# Patient Record
Sex: Female | Born: 1970 | Race: White | Hispanic: No | Marital: Married | Smoking: Former smoker
Health system: Southern US, Community
[De-identification: ages and names within clinical notes are randomized; demographics above are authoritative.]

## PROBLEM LIST (undated history)

## (undated) DIAGNOSIS — G4730 Sleep apnea, unspecified: Secondary | ICD-10-CM

## (undated) DIAGNOSIS — R569 Unspecified convulsions: Secondary | ICD-10-CM

## (undated) DIAGNOSIS — F32A Depression, unspecified: Secondary | ICD-10-CM

## (undated) DIAGNOSIS — F419 Anxiety disorder, unspecified: Secondary | ICD-10-CM

## (undated) DIAGNOSIS — E559 Vitamin D deficiency, unspecified: Secondary | ICD-10-CM

## (undated) DIAGNOSIS — E079 Disorder of thyroid, unspecified: Secondary | ICD-10-CM

## (undated) DIAGNOSIS — E739 Lactose intolerance, unspecified: Secondary | ICD-10-CM

## (undated) DIAGNOSIS — K219 Gastro-esophageal reflux disease without esophagitis: Secondary | ICD-10-CM

## (undated) DIAGNOSIS — F329 Major depressive disorder, single episode, unspecified: Secondary | ICD-10-CM

## (undated) DIAGNOSIS — I519 Heart disease, unspecified: Secondary | ICD-10-CM

## (undated) DIAGNOSIS — G43909 Migraine, unspecified, not intractable, without status migrainosus: Secondary | ICD-10-CM

## (undated) DIAGNOSIS — E039 Hypothyroidism, unspecified: Secondary | ICD-10-CM

## (undated) DIAGNOSIS — R630 Anorexia: Secondary | ICD-10-CM

## (undated) DIAGNOSIS — J45909 Unspecified asthma, uncomplicated: Secondary | ICD-10-CM

## (undated) DIAGNOSIS — F502 Bulimia nervosa, unspecified: Secondary | ICD-10-CM

## (undated) DIAGNOSIS — K589 Irritable bowel syndrome without diarrhea: Secondary | ICD-10-CM

## (undated) DIAGNOSIS — I10 Essential (primary) hypertension: Secondary | ICD-10-CM

## (undated) DIAGNOSIS — Z86718 Personal history of other venous thrombosis and embolism: Secondary | ICD-10-CM

## (undated) DIAGNOSIS — M1990 Unspecified osteoarthritis, unspecified site: Secondary | ICD-10-CM

## (undated) DIAGNOSIS — E0590 Thyrotoxicosis, unspecified without thyrotoxic crisis or storm: Secondary | ICD-10-CM

## (undated) DIAGNOSIS — G8929 Other chronic pain: Secondary | ICD-10-CM

## (undated) DIAGNOSIS — M2392 Unspecified internal derangement of left knee: Secondary | ICD-10-CM

## (undated) DIAGNOSIS — L309 Dermatitis, unspecified: Secondary | ICD-10-CM

## (undated) DIAGNOSIS — K5900 Constipation, unspecified: Secondary | ICD-10-CM

## (undated) HISTORY — PX: TOTAL ABDOMINAL HYSTERECTOMY: SHX209

## (undated) HISTORY — DX: Unspecified internal derangement of left knee: M23.92

## (undated) HISTORY — DX: Other chronic pain: G89.29

## (undated) HISTORY — DX: Unspecified osteoarthritis, unspecified site: M19.90

## (undated) HISTORY — DX: Essential (primary) hypertension: I10

## (undated) HISTORY — PX: OTHER SURGICAL HISTORY: SHX169

## (undated) HISTORY — PX: HEMANGIOMA EXCISION: SHX1734

## (undated) HISTORY — PX: OOPHORECTOMY: SHX86

## (undated) HISTORY — PX: MASS EXCISION: SHX2000

## (undated) HISTORY — DX: Vitamin D deficiency, unspecified: E55.9

## (undated) HISTORY — PX: TUBAL LIGATION: SHX77

## (undated) HISTORY — DX: Depression, unspecified: F32.A

## (undated) HISTORY — DX: Unspecified asthma, uncomplicated: J45.909

## (undated) HISTORY — DX: Lactose intolerance, unspecified: E73.9

## (undated) HISTORY — DX: Irritable bowel syndrome, unspecified: K58.9

## (undated) HISTORY — DX: Sleep apnea, unspecified: G47.30

## (undated) HISTORY — PX: COLONOSCOPY: SHX174

## (undated) HISTORY — DX: Bulimia nervosa, unspecified: F50.20

## (undated) HISTORY — DX: Gastro-esophageal reflux disease without esophagitis: K21.9

## (undated) HISTORY — DX: Heart disease, unspecified: I51.9

## (undated) HISTORY — DX: Migraine, unspecified, not intractable, without status migrainosus: G43.909

## (undated) HISTORY — DX: Unspecified convulsions: R56.9

## (undated) HISTORY — PX: TUMOR EXCISION: SHX421

## (undated) HISTORY — DX: Anxiety disorder, unspecified: F41.9

## (undated) HISTORY — DX: Dermatitis, unspecified: L30.9

## (undated) HISTORY — DX: Anorexia: R63.0

---
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
## 2011-12-28 DIAGNOSIS — E079 Disorder of thyroid, unspecified: Secondary | ICD-10-CM | POA: Insufficient documentation

## 2011-12-28 HISTORY — DX: Disorder of thyroid, unspecified: E07.9

## 2014-03-08 LAB — HM MAMMOGRAPHY

## 2019-08-19 ENCOUNTER — Encounter: Payer: Self-pay | Admitting: Family Medicine

## 2019-08-20 ENCOUNTER — Other Ambulatory Visit: Payer: Self-pay

## 2019-08-21 ENCOUNTER — Ambulatory Visit (INDEPENDENT_AMBULATORY_CARE_PROVIDER_SITE_OTHER): Payer: Self-pay | Admitting: Family Medicine

## 2019-08-21 ENCOUNTER — Encounter: Payer: Self-pay | Admitting: Family Medicine

## 2019-08-21 DIAGNOSIS — F3341 Major depressive disorder, recurrent, in partial remission: Secondary | ICD-10-CM | POA: Insufficient documentation

## 2019-08-21 DIAGNOSIS — J4540 Moderate persistent asthma, uncomplicated: Secondary | ICD-10-CM

## 2019-08-21 DIAGNOSIS — G4733 Obstructive sleep apnea (adult) (pediatric): Secondary | ICD-10-CM

## 2019-08-21 DIAGNOSIS — F419 Anxiety disorder, unspecified: Secondary | ICD-10-CM

## 2019-08-21 DIAGNOSIS — E079 Disorder of thyroid, unspecified: Secondary | ICD-10-CM

## 2019-08-21 DIAGNOSIS — K5900 Constipation, unspecified: Secondary | ICD-10-CM

## 2019-08-21 DIAGNOSIS — F411 Generalized anxiety disorder: Secondary | ICD-10-CM | POA: Insufficient documentation

## 2019-08-21 DIAGNOSIS — F32A Depression, unspecified: Secondary | ICD-10-CM

## 2019-08-21 DIAGNOSIS — F332 Major depressive disorder, recurrent severe without psychotic features: Secondary | ICD-10-CM | POA: Insufficient documentation

## 2019-08-21 DIAGNOSIS — G4730 Sleep apnea, unspecified: Secondary | ICD-10-CM | POA: Insufficient documentation

## 2019-08-21 DIAGNOSIS — J45909 Unspecified asthma, uncomplicated: Secondary | ICD-10-CM | POA: Insufficient documentation

## 2019-08-21 DIAGNOSIS — E559 Vitamin D deficiency, unspecified: Secondary | ICD-10-CM

## 2019-08-21 DIAGNOSIS — F329 Major depressive disorder, single episode, unspecified: Secondary | ICD-10-CM

## 2019-08-21 MED ORDER — DULOXETINE HCL 30 MG PO CPEP: 30.0000 mg | ORAL_CAPSULE | Freq: Every day | ORAL | 2 refills | Status: DC

## 2019-08-21 NOTE — Progress Notes (Signed)
New Patient Office Visit  Assessment & Plan:  1. Moderate persistent asthma without complication - Uncontrolled. Encouraged to use Albuterol as needed. Discussed a maintenance inhaler; patient is agreeable but will wait until she has health insurance. Financial assistance form given to patient today.   2. Obstructive sleep apnea syndrome - Well controlled on current regimen.   3. Disease of thyroid gland - Labs today to assess.  - Thyroid Panel With TSH - Thyroid antibodies - Parathyroid hormone, intact (no Ca) - VITAMIN D 25 Hydroxy (Vit-D Deficiency, Fractures) - Phosphorus - CBC with Differential/Platelet  4. Anxiety - Uncontrolled. Patient willing to try medication again and stay on it.  - DULoxetine (CYMBALTA) 30 MG capsule; Take 1 capsule (30 mg total) by mouth daily.  Dispense: 30 capsule; Refill: 2 - CMP14+EGFR - CBC with Differential/Platelet  5. Depression, unspecified depression type - Uncontrolled. Patient willing to try medication again and stay on it.  - DULoxetine (CYMBALTA) 30 MG capsule; Take 1 capsule (30 mg total) by mouth daily.  Dispense: 30 capsule; Refill: 2 - CMP14+EGFR - CBC with Differential/Platelet  6. Vitamin D deficiency - Well controlled on current regimen.  - VITAMIN D 25 Hydroxy (Vit-D Deficiency, Fractures) - Phosphorus - CMP14+EGFR  7. Constipation, unspecified constipation type - Well controlled on current regimen of Colace and Miralax.    Follow-up: Return in about 6 weeks (around 10/02/2019) for anxiety/depression.   [name], MSN, APRN, FNP-C [facility]  Subjective:  Patient ID: Anndrea Mihelich, female    DOB: [birth year]  Age: 48 y.o. MRN: [id number]  Patient Care Team: Loman Brooklyn, FNP as PCP - General (Family Medicine)  CC:  Chief Complaint  Patient presents with  . Establish Care    HPI Jannifer Fischler presents to establish care. She has recently moved from [place].   Asthma:  using Albuterol nebulizer "less than when she was in [place]". Patient states she has tried not to use it but knows that she really does need to use it several times throughout the day.   Sleep Apnea: patient wears CPAP nightly.   Thyroid disease: patient reports she has been to seven endocrinologist in [place]. She states each one tells her something different and her TSH is always all over the place and never consistent no matter what they do with her medications. She reports she was finally told to pick a dosage that she feels good on and they would keep it that way. She picked 100 mcg. She states she actually feels better on higher dosages but they cause chest pain. When she is decreased to lower dosages she gains weight. She reports last time they lowered her to 88 mcg she gained 70 lbs. States she is always doing insanity and beach body workouts but she cannot keep the weight off when they lower the levothyroxine. She begs today that regardless of what labs show, not to lower her dosage.   Anxiety/Depression: patient reports she has never stayed on anything consistent to help the anxiety and depression but she is ready to do so. According to previous records she has failed therapy with Paxil, Lexparo, Prozac, Wellbutrin, Xanax, and Ativan.   Depression screen [facility] 2/9 08/21/2019 08/21/2019  Decreased Interest 2 0  Down, Depressed, Hopeless 0 0  PHQ - 2 Score 2 0  Altered sleeping 2 -  Tired, decreased energy 3 -  Change in appetite 2 -  Feeling bad or failure about yourself  0 -  Trouble concentrating  0 -  Moving slowly or fidgety/restless 0 -  Suicidal thoughts 0 -  PHQ-9 Score 9 -  Difficult doing work/chores Somewhat difficult -   GAD 7 : Generalized Anxiety Score 08/21/2019  Nervous, Anxious, on Edge 3  Control/stop worrying 1  Worry too much - different things 0  Trouble relaxing 2  Restless 1  Easily annoyed or irritable 2  Afraid - awful might happen 0  Total GAD 7 Score 9   Anxiety Difficulty Not difficult at all    Review of Systems  Constitutional: Negative for chills, fever, malaise/fatigue and weight loss.  HENT: Negative for congestion, ear discharge, ear pain, nosebleeds, sinus pain, sore throat and tinnitus.   Eyes: Negative for blurred vision, double vision, pain, discharge and redness.  Respiratory: Positive for shortness of breath and wheezing. Negative for cough.   Cardiovascular: Negative for chest pain, palpitations and leg swelling.  Gastrointestinal: Positive for constipation. Negative for abdominal pain, diarrhea, heartburn, nausea and vomiting.  Genitourinary: Negative for dysuria, frequency and urgency.  Musculoskeletal: Positive for back pain and joint pain. Negative for myalgias.  Skin: Negative for rash.  Neurological: Negative for dizziness, seizures, weakness and headaches.  Psychiatric/Behavioral: Positive for depression. Negative for substance abuse and suicidal ideas. The patient is nervous/anxious.     Current Outpatient Medications:  .  albuterol (ACCUNEB) 0.63 MG/3ML nebulizer solution, Take 1 ampule by nebulization every 6 (six) hours as needed for wheezing., Disp: , Rfl:  .  aspirin EC 81 MG tablet, Take 81 mg by mouth daily., Disp: , Rfl:  .  b complex vitamins tablet, Take 1 tablet by mouth daily., Disp: , Rfl:  .  levothyroxine (SYNTHROID) 100 MCG tablet, Take 100 mcg by mouth daily before breakfast., Disp: , Rfl:  .  Multiple Vitamins-Minerals (MULTIVITAMIN ADULT PO), Take 1 tablet by mouth daily., Disp: , Rfl:  .  Riboflavin (VITAMIN B-2 PO), Take 1 tablet by mouth daily., Disp: , Rfl:  .  Vitamin D, Ergocalciferol, (DRISDOL) 1.25 MG (50000 UT) CAPS capsule, Take 50,000 Units by mouth every 7 (seven) days., Disp: , Rfl:  .  DULoxetine (CYMBALTA) 30 MG capsule, Take 1 capsule (30 mg total) by mouth daily., Disp: 30 capsule, Rfl: 2  Allergies  Allergen Reactions  . Iodine Anaphylaxis  . Latex Anaphylaxis    Past  Medical History:  Diagnosis Date  . Anxiety    Failed therapy with Paxil, Lexparo, Prozac, Wellbutrin, Xanax, and Ativan  . Arthritis   . Asthma   . Cardiac disease    Angina  . Chronic back pain   . Depression    Failed therapy with Paxil, Lexparo, Prozac, Wellbutrin, and Ativan  . Disease of thyroid gland 12/28/2011   multiple thyroid nodules in 2013 - [place] on 03/08/14 showed heterogeneous appearance of thyroid gland without focal nodule  . Eczema   . Generalized seizure ([place])    stress related  . GERD (gastroesophageal reflux disease)   . History of DVT (deep vein thrombosis) 1988  . Hypertension   . IBS (irritable bowel syndrome)   . Internal derangement of left knee   . Lactose intolerance   . Migraine   . Sleep apnea    wears CPAP  . Vitamin D deficiency     Past Surgical History:  Procedure Laterality Date  . ABDOMINAL HYSTERECTOMY    . CESAREAN SECTION     x3  . COLONOSCOPY    . HEMANGIOMA EXCISION Right    shoulder  .  MASS EXCISION Right    lump from palm of hand  . OOPHORECTOMY    . OTHER SURGICAL HISTORY Right    Repair of tib-fib fracture  . TUBAL LIGATION    . TUMOR EXCISION Right    Axilla - benign  . VASCULAR SURGERY Right 1989   blood clot removed from neck    Family History  Problem Relation Age of Onset  . Other Niece        antiphospholipid AB syndrome  . Factor V Leiden deficiency Sister   . CVA Sister        29s  . Thyroid disease Sister   . Breast cancer Sister   . Multiple sclerosis Sister   . Other Sister        guillain barre  . Depression Sister   . Migraines Sister   . Arthritis Mother   . Hypertension Mother   . Thyroid disease Mother   . Depression Mother   . Migraines Mother   . Arthritis Father   . Hypertension Father   . Depression Father   . Hypertension Maternal Grandmother   . Depression Maternal Grandmother   . Anxiety disorder Maternal Grandmother   . Hypertension Maternal Grandfather   . Hypertension Paternal  Grandmother   . Hypertension Paternal Grandfather   . CVA Sister   . Thyroid disease Sister   . Depression Sister   . Migraines Sister   . Asthma Daughter   . Depression Daughter   . Anxiety disorder Daughter   . Irritable bowel syndrome Daughter   . Lactose intolerance Daughter   . Migraines Daughter   . Asthma Son   . Depression Son   . Anxiety disorder Son   . Irritable bowel syndrome Son   . Lactose intolerance Son   . Migraines Son   . Asthma Son   . Depression Son   . Anxiety disorder Son   . Irritable bowel syndrome Son   . Lactose intolerance Son   . Migraines Son     Social History   Socioeconomic History  . Marital status: Married    Spouse name: Not on file  . Number of children: Not on file  . Years of education: Not on file  . Highest education level: Not on file  Occupational History  . Not on file  Social Needs  . Financial resource strain: Not on file  . Food insecurity    Worry: Not on file    Inability: Not on file  . Transportation needs    Medical: Not on file    Non-medical: Not on file  Tobacco Use  . Smoking status: Never Smoker  . Smokeless tobacco: Never Used  Substance and Sexual Activity  . Alcohol use: Never    Frequency: Never  . Drug use: Never  . Sexual activity: Not on file  Lifestyle  . Physical activity    Days per week: Not on file    Minutes per session: Not on file  . Stress: Not on file  Relationships  . Social Herbalist on phone: Not on file    Gets together: Not on file    Attends religious service: Not on file    Active member of club or organization: Not on file    Attends meetings of clubs or organizations: Not on file    Relationship status: Not on file  . Intimate partner violence    Fear of current or ex partner: Not on  file    Emotionally abused: Not on file    Physically abused: Not on file    Forced sexual activity: Not on file  Other Topics Concern  . Not on file  Social History  Narrative  . Not on file    Objective:   Today's Vitals: BP 119/77   Pulse 65   Temp 97.7 F (36.5 C) (Oral)   Resp 20   Ht '5\' 6"'$  (1.676 m)   Wt 186 lb (84.4 kg)   SpO2 98%   BMI 30.02 kg/m   Physical Exam Vitals signs reviewed.  Constitutional:      General: She is not in acute distress.    Appearance: Normal appearance. She is obese. She is not ill-appearing, toxic-appearing or diaphoretic.  HENT:     Head: Normocephalic and atraumatic.  Eyes:     General: No scleral icterus.       Right eye: No discharge.        Left eye: No discharge.     Conjunctiva/sclera: Conjunctivae normal.  Neck:     Musculoskeletal: Normal range of motion.  Cardiovascular:     Rate and Rhythm: Normal rate and regular rhythm.     Heart sounds: Normal heart sounds. No murmur. No friction rub. No gallop.   Pulmonary:     Effort: Pulmonary effort is normal. No respiratory distress.     Breath sounds: Normal breath sounds. No stridor. No wheezing, rhonchi or rales.  Musculoskeletal: Normal range of motion.  Skin:    General: Skin is warm and dry.     Capillary Refill: Capillary refill takes less than 2 seconds.  Neurological:     General: No focal deficit present.     Mental Status: She is alert and oriented to person, place, and time. Mental status is at baseline.  Psychiatric:        Mood and Affect: Mood normal.        Behavior: Behavior normal.        Thought Content: Thought content normal.        Judgment: Judgment normal.

## 2019-08-22 LAB — CBC WITH DIFFERENTIAL/PLATELET
Basophils Absolute: 0 10*3/uL (ref 0.0–0.2)
Basos: 0 %
EOS (ABSOLUTE): 0.1 10*3/uL (ref 0.0–0.4)
Eos: 2 %
Hematocrit: 44.2 % (ref 34.0–46.6)
Hemoglobin: 14.9 g/dL (ref 11.1–15.9)
Immature Grans (Abs): 0 10*3/uL (ref 0.0–0.1)
Immature Granulocytes: 0 %
Lymphocytes Absolute: 1.1 10*3/uL (ref 0.7–3.1)
Lymphs: 26 %
MCH: 30.1 pg (ref 26.6–33.0)
MCHC: 33.7 g/dL (ref 31.5–35.7)
MCV: 89 fL (ref 79–97)
Monocytes Absolute: 0.4 10*3/uL (ref 0.1–0.9)
Monocytes: 9 %
Neutrophils Absolute: 2.8 10*3/uL (ref 1.4–7.0)
Neutrophils: 63 %
Platelets: 164 10*3/uL (ref 150–450)
RBC: 4.95 x10E6/uL (ref 3.77–5.28)
RDW: 13.1 % (ref 11.7–15.4)
WBC: 4.4 10*3/uL (ref 3.4–10.8)

## 2019-08-22 LAB — CMP14+EGFR
ALT: 37 IU/L — ABNORMAL HIGH (ref 0–32)
AST: 33 IU/L (ref 0–40)
Albumin/Globulin Ratio: 2.2 (ref 1.2–2.2)
Albumin: 4.7 g/dL (ref 3.8–4.8)
Alkaline Phosphatase: 67 IU/L (ref 39–117)
BUN/Creatinine Ratio: 20 (ref 9–23)
BUN: 12 mg/dL (ref 6–24)
Bilirubin Total: 0.8 mg/dL (ref 0.0–1.2)
CO2: 21 mmol/L (ref 20–29)
Calcium: 9.6 mg/dL (ref 8.7–10.2)
Chloride: 103 mmol/L (ref 96–106)
Creatinine, Ser: 0.6 mg/dL (ref 0.57–1.00)
GFR calc Af Amer: 123 mL/min/{1.73_m2} (ref >59)
GFR calc non Af Amer: 107 mL/min/{1.73_m2} (ref >59)
Globulin, Total: 2.1 g/dL (ref 1.5–4.5)
Glucose: 102 mg/dL — ABNORMAL HIGH (ref 65–99)
Potassium: 4.4 mmol/L (ref 3.5–5.2)
Sodium: 142 mmol/L (ref 134–144)
Total Protein: 6.8 g/dL (ref 6.0–8.5)

## 2019-08-22 LAB — THYROID PANEL WITH TSH
Free Thyroxine Index: 2.7 (ref 1.2–4.9)
T3 Uptake Ratio: 29 % (ref 24–39)
T4, Total: 9.4 ug/dL (ref 4.5–12.0)
TSH: 0.11 u[IU]/mL — ABNORMAL LOW (ref 0.450–4.500)

## 2019-08-22 LAB — THYROID ANTIBODIES
Thyroglobulin Antibody: 3.1 IU/mL — ABNORMAL HIGH (ref 0.0–0.9)
Thyroperoxidase Ab SerPl-aCnc: 11 IU/mL (ref 0–34)

## 2019-08-22 LAB — PHOSPHORUS: Phosphorus: 4.2 mg/dL (ref 3.0–4.3)

## 2019-08-22 LAB — VITAMIN D 25 HYDROXY (VIT D DEFICIENCY, FRACTURES): Vit D, 25-Hydroxy: 62.6 ng/mL (ref 30.0–100.0)

## 2019-08-22 LAB — PARATHYROID HORMONE, INTACT (NO CA): PTH: 23 pg/mL (ref 15–65)

## 2019-08-26 ENCOUNTER — Encounter: Payer: Self-pay | Admitting: Family Medicine

## 2019-08-26 DIAGNOSIS — K5900 Constipation, unspecified: Secondary | ICD-10-CM

## 2019-08-26 DIAGNOSIS — K589 Irritable bowel syndrome without diarrhea: Secondary | ICD-10-CM | POA: Insufficient documentation

## 2019-08-26 HISTORY — DX: Constipation, unspecified: K59.00

## 2019-08-27 ENCOUNTER — Other Ambulatory Visit: Payer: Self-pay | Admitting: Family Medicine

## 2019-08-27 DIAGNOSIS — E079 Disorder of thyroid, unspecified: Secondary | ICD-10-CM

## 2019-09-04 ENCOUNTER — Encounter: Payer: Self-pay | Admitting: Family Medicine

## 2019-09-14 ENCOUNTER — Encounter: Payer: Self-pay | Admitting: Family Medicine

## 2019-09-14 DIAGNOSIS — G4733 Obstructive sleep apnea (adult) (pediatric): Secondary | ICD-10-CM

## 2019-09-15 NOTE — Telephone Encounter (Signed)
I left a message for her on her home number - The mobile number listed was not working - Her Endocrinology ref is in Review with [facility] endocrinology.   As far as her supplies - I do not handle that - That would be [employer].

## 2019-09-30 ENCOUNTER — Encounter: Payer: Self-pay | Admitting: Family Medicine

## 2019-09-30 DIAGNOSIS — F419 Anxiety disorder, unspecified: Secondary | ICD-10-CM

## 2019-09-30 DIAGNOSIS — F329 Major depressive disorder, single episode, unspecified: Secondary | ICD-10-CM

## 2019-09-30 DIAGNOSIS — F32A Depression, unspecified: Secondary | ICD-10-CM

## 2019-09-30 MED ORDER — DULOXETINE HCL 30 MG PO CPEP: 30.0000 mg | ORAL_CAPSULE | Freq: Every day | ORAL | 1 refills | Status: DC

## 2019-09-30 MED ORDER — LEVOTHYROXINE SODIUM 100 MCG PO TABS: 100.0000 ug | ORAL_TABLET | Freq: Every day | ORAL | 1 refills | Status: DC

## 2019-10-05 ENCOUNTER — Other Ambulatory Visit: Payer: Self-pay

## 2019-10-05 MED ORDER — LEVOTHYROXINE SODIUM 100 MCG PO TABS: 100.0000 ug | ORAL_TABLET | Freq: Every day | ORAL | 1 refills | Status: DC

## 2019-10-06 ENCOUNTER — Ambulatory Visit: Payer: Self-pay | Admitting: Family Medicine

## 2019-10-22 ENCOUNTER — Encounter: Payer: Self-pay | Admitting: Family Medicine

## 2019-10-22 ENCOUNTER — Other Ambulatory Visit: Payer: Self-pay

## 2019-10-22 DIAGNOSIS — F329 Major depressive disorder, single episode, unspecified: Secondary | ICD-10-CM

## 2019-10-22 DIAGNOSIS — F32A Depression, unspecified: Secondary | ICD-10-CM

## 2019-10-22 DIAGNOSIS — F419 Anxiety disorder, unspecified: Secondary | ICD-10-CM

## 2019-10-22 MED ORDER — LEVOTHYROXINE SODIUM 100 MCG PO TABS: 100.0000 ug | ORAL_TABLET | Freq: Every day | ORAL | 1 refills | Status: DC

## 2019-10-22 MED ORDER — DULOXETINE HCL 30 MG PO CPEP: 30.0000 mg | ORAL_CAPSULE | Freq: Every day | ORAL | 1 refills | Status: DC

## 2019-11-05 ENCOUNTER — Encounter: Payer: Self-pay | Admitting: Family Medicine

## 2019-11-09 MED ORDER — SYNTHROID 100 MCG PO TABS: 100.0000 ug | ORAL_TABLET | Freq: Every day | ORAL | 1 refills | Status: DC

## 2019-11-29 ENCOUNTER — Encounter: Payer: Self-pay | Admitting: Family Medicine

## 2019-11-30 ENCOUNTER — Other Ambulatory Visit: Payer: Self-pay | Admitting: *Deleted

## 2019-11-30 DIAGNOSIS — F419 Anxiety disorder, unspecified: Secondary | ICD-10-CM

## 2019-11-30 DIAGNOSIS — F329 Major depressive disorder, single episode, unspecified: Secondary | ICD-10-CM

## 2019-11-30 DIAGNOSIS — F32A Depression, unspecified: Secondary | ICD-10-CM

## 2019-11-30 MED ORDER — DULOXETINE HCL 30 MG PO CPEP: 30.0000 mg | ORAL_CAPSULE | Freq: Every day | ORAL | 0 refills | Status: DC

## 2019-12-01 ENCOUNTER — Encounter: Payer: Self-pay | Admitting: Family Medicine

## 2019-12-01 DIAGNOSIS — R3989 Other symptoms and signs involving the genitourinary system: Secondary | ICD-10-CM

## 2019-12-01 MED ORDER — CIPROFLOXACIN HCL 500 MG PO TABS: 500.0000 mg | ORAL_TABLET | Freq: Two times a day (BID) | ORAL | 0 refills | Status: AC

## 2019-12-03 ENCOUNTER — Encounter: Payer: Self-pay | Admitting: Family Medicine

## 2019-12-30 ENCOUNTER — Encounter: Payer: Self-pay | Admitting: Family Medicine

## 2020-01-11 ENCOUNTER — Encounter: Payer: Self-pay | Admitting: Family Medicine

## 2020-03-23 ENCOUNTER — Encounter: Payer: Self-pay | Admitting: Family Medicine

## 2020-03-23 DIAGNOSIS — E079 Disorder of thyroid, unspecified: Secondary | ICD-10-CM

## 2020-04-08 ENCOUNTER — Encounter: Payer: Self-pay | Admitting: Family Medicine

## 2020-04-18 ENCOUNTER — Encounter: Payer: Self-pay | Admitting: Family Medicine

## 2020-04-19 ENCOUNTER — Encounter: Payer: Self-pay | Admitting: Family Medicine

## 2020-04-19 DIAGNOSIS — F5101 Primary insomnia: Secondary | ICD-10-CM

## 2020-04-20 MED ORDER — TEMAZEPAM 15 MG PO CAPS: 15.0000 mg | ORAL_CAPSULE | Freq: Every evening | ORAL | 2 refills | Status: DC | PRN

## 2020-04-24 ENCOUNTER — Encounter: Payer: Self-pay | Admitting: Family Medicine

## 2020-04-24 DIAGNOSIS — K5900 Constipation, unspecified: Secondary | ICD-10-CM

## 2020-04-26 MED ORDER — LACTULOSE 10 G PO PACK: 10.0000 g | PACK | Freq: Every day | ORAL | 2 refills | Status: DC

## 2020-04-27 ENCOUNTER — Encounter: Payer: Self-pay | Admitting: Family Medicine

## 2020-04-27 DIAGNOSIS — K5900 Constipation, unspecified: Secondary | ICD-10-CM

## 2020-04-28 MED ORDER — LACTULOSE 10 G PO PACK: 10.0000 g | PACK | Freq: Every day | ORAL | 2 refills | Status: DC

## 2020-04-29 ENCOUNTER — Encounter: Payer: Self-pay | Admitting: Family Medicine

## 2020-04-29 DIAGNOSIS — K5900 Constipation, unspecified: Secondary | ICD-10-CM

## 2020-05-02 MED ORDER — LACTULOSE 10 GM/15ML PO SOLN: 10.0000 g | Freq: Every day | ORAL | 1 refills | Status: DC | PRN

## 2020-06-20 ENCOUNTER — Encounter: Payer: Self-pay | Admitting: Family Medicine

## 2020-06-22 ENCOUNTER — Other Ambulatory Visit: Payer: Self-pay | Admitting: Family Medicine

## 2020-06-22 DIAGNOSIS — Z1231 Encounter for screening mammogram for malignant neoplasm of breast: Secondary | ICD-10-CM

## 2020-06-29 ENCOUNTER — Other Ambulatory Visit: Payer: Self-pay | Admitting: Family Medicine

## 2020-06-29 DIAGNOSIS — F32A Depression, unspecified: Secondary | ICD-10-CM

## 2020-06-29 DIAGNOSIS — F419 Anxiety disorder, unspecified: Secondary | ICD-10-CM

## 2020-06-30 NOTE — Telephone Encounter (Signed)
Left message for patient to call back to schedule an appointment for medication refills. 

## 2020-06-30 NOTE — Telephone Encounter (Signed)
Last OV 08/21/19. Last RF 30 day supply given 11/2019. Next OV not scheduled  Refill denied-needs appt for refills

## 2020-07-06 ENCOUNTER — Other Ambulatory Visit: Payer: Self-pay | Admitting: Family Medicine

## 2020-07-06 DIAGNOSIS — F419 Anxiety disorder, unspecified: Secondary | ICD-10-CM

## 2020-07-06 DIAGNOSIS — F32A Depression, unspecified: Secondary | ICD-10-CM

## 2020-07-07 MED ORDER — DULOXETINE HCL 30 MG PO CPEP: 30.0000 mg | ORAL_CAPSULE | Freq: Every day | ORAL | 0 refills | Status: DC

## 2020-07-08 ENCOUNTER — Encounter: Payer: Self-pay | Admitting: Family Medicine

## 2020-08-03 ENCOUNTER — Encounter: Payer: Self-pay | Admitting: Family Medicine

## 2020-08-03 DIAGNOSIS — F5101 Primary insomnia: Secondary | ICD-10-CM

## 2020-08-04 MED ORDER — TEMAZEPAM 15 MG PO CAPS: 15.0000 mg | ORAL_CAPSULE | Freq: Every evening | ORAL | 2 refills | Status: DC | PRN

## 2020-08-05 ENCOUNTER — Encounter: Payer: Self-pay | Admitting: Family Medicine

## 2020-08-05 ENCOUNTER — Ambulatory Visit: Payer: 59 | Admitting: Family Medicine

## 2020-08-05 ENCOUNTER — Other Ambulatory Visit: Payer: Self-pay

## 2020-08-05 DIAGNOSIS — F332 Major depressive disorder, recurrent severe without psychotic features: Secondary | ICD-10-CM

## 2020-08-05 DIAGNOSIS — F419 Anxiety disorder, unspecified: Secondary | ICD-10-CM | POA: Diagnosis not present

## 2020-08-05 MED ORDER — DULOXETINE HCL 30 MG PO CPEP: 30.0000 mg | ORAL_CAPSULE | Freq: Two times a day (BID) | ORAL | 2 refills | Status: DC

## 2020-08-05 NOTE — Progress Notes (Signed)
Assessment & Plan:  1-2. Severe episode of recurrent major depressive disorder, without psychotic features (HCC)/Anxiety - Improving. Cymbalta increased from 30 mg QD to BID.  - DULoxetine (CYMBALTA) 30 MG capsule; Take 1 capsule (30 mg total) by mouth 2 (two) times daily.  Dispense: 60 capsule; Refill: 2   Return in about 6 weeks (around 09/16/2020) for depression, anxiety.  Deliah Boston, MSN, APRN, FNP-C [facility]  Subjective:    [name], female    DOB: [birth year], 49 y.o.   MRN: [id number]  Patient Care Team: Gwenlyn Fudge, FNP as PCP - General (Family Medicine)   Chief Complaint:  Chief Complaint  Patient presents with  . Depression    follow up- Patient states she would like her medication increased due to her depression being worse.    HPI: Olivia Zamora is a 49 y.o. female presenting on 08/05/2020 for Depression (follow up- Patient states she would like her medication increased due to her depression being worse.)  Patient is here for a follow-up of depression. She reports the medication worked well initially, but now feels she needs an increase as she is starting to feel flat. She has started experiencing tearfulness, sadness, and decline in her motivation.   Depression screen [facility] 2/9 08/05/2020 08/21/2019 08/21/2019  Decreased Interest 3 2 0  Down, Depressed, Hopeless 2 0 0  PHQ - 2 Score 5 2 0  Altered sleeping 3 2 -  Tired, decreased energy 3 3 -  Change in appetite 3 2 -  Feeling bad or failure about yourself  1 0 -  Trouble concentrating 2 0 -  Moving slowly or fidgety/restless 2 0 -  Suicidal thoughts 0 0 -  PHQ-9 Score 19 9 -  Difficult doing work/chores - Somewhat difficult -   GAD 7 : Generalized Anxiety Score 08/05/2020 08/21/2019  Nervous, Anxious, on Edge 3 3  Control/stop worrying 0 1  Worry too much - different things 2 0  Trouble relaxing 2 2  Restless 2 1  Easily annoyed or irritable 3 2  Afraid -  awful might happen 2 0  Total GAD 7 Score 14 9  Anxiety Difficulty - Not difficult at all   New complaints: None  Social history:  Relevant past medical, surgical, family and social history reviewed and updated as indicated. Interim medical history since our last visit reviewed.  Allergies and medications reviewed and updated.  DATA REVIEWED: CHART IN EPIC  ROS: Negative unless specifically indicated above in HPI.    Current Outpatient Medications:  .  albuterol (ACCUNEB) 0.63 MG/3ML nebulizer solution, Take 1 ampule by nebulization every 6 (six) hours as needed for wheezing., Disp: , Rfl:  .  aspirin EC 81 MG tablet, Take 81 mg by mouth daily., Disp: , Rfl:  .  b complex vitamins tablet, Take 1 tablet by mouth daily., Disp: , Rfl:  .  docusate sodium (COLACE) 100 MG capsule, Take 100 mg by mouth., Disp: , Rfl:  .  DULoxetine (CYMBALTA) 30 MG capsule, Take 1 capsule (30 mg total) by mouth daily. (Needs to be seen before next refill), Disp: 30 capsule, Rfl: 0 .  lactulose (CHRONULAC) 10 GM/15ML solution, Take 15 mLs (10 g total) by mouth daily as needed for mild constipation., Disp: 473 mL, Rfl: 1 .  Multiple Vitamins-Minerals (MULTIVITAMIN ADULT PO), Take 1 tablet by mouth daily., Disp: , Rfl:  .  polyethylene glycol (MIRALAX / GLYCOLAX) 17 g packet, Take 17 g by  mouth., Disp: , Rfl:  .  Riboflavin (VITAMIN B-2 PO), Take 1 tablet by mouth daily., Disp: , Rfl:  .  SYNTHROID 112 MCG tablet, Take 112 mcg by mouth daily., Disp: , Rfl:  .  temazepam (RESTORIL) 15 MG capsule, Take 1 capsule (15 mg total) by mouth at bedtime as needed for sleep., Disp: 30 capsule, Rfl: 2 .  Vitamin D, Ergocalciferol, (DRISDOL) 1.25 MG (50000 UT) CAPS capsule, Take 50,000 Units by mouth every 7 (seven) days., Disp: , Rfl:    Allergies  Allergen Reactions  . Iodine Anaphylaxis  . Latex Anaphylaxis   Past Medical History:  Diagnosis Date  . Anxiety    Failed therapy with Paxil, Lexparo, Prozac,  Wellbutrin, Xanax, and Ativan  . Arthritis   . Asthma   . Cardiac disease    Angina  . Chronic back pain   . Constipation 08/26/2019  . Depression    Failed therapy with Paxil, Lexparo, Prozac, Wellbutrin, and Ativan  . Disease of thyroid gland 12/28/2011   multiple thyroid nodules in 2013 - [place] on 03/08/14 showed heterogeneous appearance of thyroid gland without focal nodule  . Eczema   . Generalized seizure (HCC)    stress related  . GERD (gastroesophageal reflux disease)   . History of DVT (deep vein thrombosis) 1988  . Hypertension   . IBS (irritable bowel syndrome)   . Internal derangement of left knee   . Lactose intolerance   . Migraine   . Sleep apnea    wears CPAP  . Vitamin D deficiency     Past Surgical History:  Procedure Laterality Date  . ABDOMINAL HYSTERECTOMY    . CESAREAN SECTION     x3  . COLONOSCOPY    . HEMANGIOMA EXCISION Right    shoulder  . MASS EXCISION Right    lump from palm of hand  . OOPHORECTOMY    . OTHER SURGICAL HISTORY Right    Repair of tib-fib fracture  . TUBAL LIGATION    . TUMOR EXCISION Right    Axilla - benign  . VASCULAR SURGERY Right 1989   blood clot removed from neck    Social History   Socioeconomic History  . Marital status: Married    Spouse name: Not on file  . Number of children: Not on file  . Years of education: Not on file  . Highest education level: Not on file  Occupational History  . Not on file  Tobacco Use  . Smoking status: Never Smoker  . Smokeless tobacco: Never Used  Substance and Sexual Activity  . Alcohol use: Never  . Drug use: Never  . Sexual activity: Not on file  Other Topics Concern  . Not on file  Social History Narrative  . Not on file   Social Determinants of Health   Financial Resource Strain:   . Difficulty of Paying Living Expenses: Not on file  Food Insecurity:   . Worried About Programme researcher, broadcasting/film/video in the Last Year: Not on file  . Ran Out of Food in the Last Year: Not on file   Transportation Needs:   . Lack of Transportation (Medical): Not on file  . Lack of Transportation (Non-Medical): Not on file  Physical Activity:   . Days of Exercise per Week: Not on file  . Minutes of Exercise per Session: Not on file  Stress:   . Feeling of Stress : Not on file  Social Connections:   . Frequency of Communication with  Friends and Family: Not on file  . Frequency of Social Gatherings with Friends and Family: Not on file  . Attends Religious Services: Not on file  . Active Member of Clubs or Organizations: Not on file  . Attends Banker Meetings: Not on file  . Marital Status: Not on file  Intimate Partner Violence:   . Fear of Current or Ex-Partner: Not on file  . Emotionally Abused: Not on file  . Physically Abused: Not on file  . Sexually Abused: Not on file        Objective:    BP 134/86   Pulse 79   Temp (!) 96.8 F (36 C) (Temporal)   Ht 5\' 6"  (1.676 m)   Wt 193 lb 12.8 oz (87.9 kg)   SpO2 98%   BMI 31.28 kg/m   Wt Readings from Last 3 Encounters:  08/05/20 193 lb 12.8 oz (87.9 kg)  08/21/19 186 lb (84.4 kg)    Physical Exam Vitals reviewed.  Constitutional:      General: She is not in acute distress.    Appearance: Normal appearance. She is obese. She is not ill-appearing, toxic-appearing or diaphoretic.  HENT:     Head: Normocephalic and atraumatic.  Eyes:     General: No scleral icterus.       Right eye: No discharge.        Left eye: No discharge.     Conjunctiva/sclera: Conjunctivae normal.  Cardiovascular:     Rate and Rhythm: Normal rate and regular rhythm.     Heart sounds: Normal heart sounds. No murmur heard.  No friction rub. No gallop.   Pulmonary:     Effort: Pulmonary effort is normal. No respiratory distress.     Breath sounds: Normal breath sounds. No stridor. No wheezing, rhonchi or rales.  Musculoskeletal:        General: Normal range of motion.     Cervical back: Normal range of motion.  Skin:     General: Skin is warm and dry.     Capillary Refill: Capillary refill takes less than 2 seconds.  Neurological:     General: No focal deficit present.     Mental Status: She is alert and oriented to person, place, and time. Mental status is at baseline.  Psychiatric:        Mood and Affect: Mood normal.        Behavior: Behavior normal.        Thought Content: Thought content normal.        Judgment: Judgment normal.     Lab Results  Component Value Date   TSH 0.110 (L) 08/21/2019   Lab Results  Component Value Date   WBC 4.4 08/21/2019   HGB 14.9 08/21/2019   HCT 44.2 08/21/2019   MCV 89 08/21/2019   PLT 164 08/21/2019   Lab Results  Component Value Date   NA 142 08/21/2019   K 4.4 08/21/2019   CO2 21 08/21/2019   GLUCOSE 102 (H) 08/21/2019   BUN 12 08/21/2019   CREATININE 0.60 08/21/2019   BILITOT 0.8 08/21/2019   ALKPHOS 67 08/21/2019   AST 33 08/21/2019   ALT 37 (H) 08/21/2019   PROT 6.8 08/21/2019   ALBUMIN 4.7 08/21/2019   CALCIUM 9.6 08/21/2019   No results found for: CHOL No results found for: HDL No results found for: LDLCALC No results found for: TRIG No results found for: CHOLHDL No results found for: 10/21/2019

## 2020-08-07 ENCOUNTER — Encounter: Payer: Self-pay | Admitting: Family Medicine

## 2020-08-10 ENCOUNTER — Other Ambulatory Visit (HOSPITAL_COMMUNITY): Payer: Self-pay | Admitting: Urology

## 2020-08-10 ENCOUNTER — Other Ambulatory Visit: Payer: Self-pay | Admitting: Urology

## 2020-08-17 ENCOUNTER — Other Ambulatory Visit: Payer: Self-pay | Admitting: Urology

## 2020-08-17 DIAGNOSIS — R3982 Chronic bladder pain: Secondary | ICD-10-CM

## 2020-08-24 ENCOUNTER — Encounter (HOSPITAL_COMMUNITY): Payer: Self-pay

## 2020-08-24 ENCOUNTER — Ambulatory Visit (HOSPITAL_COMMUNITY): Payer: 59

## 2020-08-24 ENCOUNTER — Encounter: Payer: Self-pay | Admitting: Family Medicine

## 2020-08-24 DIAGNOSIS — F5101 Primary insomnia: Secondary | ICD-10-CM

## 2020-08-26 MED ORDER — TEMAZEPAM 30 MG PO CAPS: 30.0000 mg | ORAL_CAPSULE | Freq: Every day | ORAL | 2 refills | Status: DC

## 2020-09-05 ENCOUNTER — Encounter: Payer: Self-pay | Admitting: Family Medicine

## 2020-09-07 ENCOUNTER — Other Ambulatory Visit: Payer: Self-pay

## 2020-09-07 ENCOUNTER — Ambulatory Visit
Admission: RE | Admit: 2020-09-07 | Discharge: 2020-09-07 | Disposition: A | Discharge status: Home / Self Care | Payer: 59 | Source: Ambulatory Visit | Attending: Family Medicine | Admitting: Family Medicine

## 2020-09-07 DIAGNOSIS — Z1231 Encounter for screening mammogram for malignant neoplasm of breast: Secondary | ICD-10-CM

## 2020-09-15 ENCOUNTER — Ambulatory Visit: Payer: 59 | Admitting: Family Medicine

## 2020-09-18 ENCOUNTER — Encounter: Payer: Self-pay | Admitting: Family Medicine

## 2020-09-28 ENCOUNTER — Ambulatory Visit: Payer: 59 | Admitting: Family Medicine

## 2020-10-03 ENCOUNTER — Encounter: Payer: Self-pay | Admitting: Family Medicine

## 2020-10-07 ENCOUNTER — Ambulatory Visit: Payer: 59 | Admitting: Family Medicine

## 2020-10-31 ENCOUNTER — Encounter: Payer: Self-pay | Admitting: Family Medicine

## 2020-11-14 ENCOUNTER — Other Ambulatory Visit: Payer: Self-pay | Admitting: Family Medicine

## 2020-11-14 DIAGNOSIS — F332 Major depressive disorder, recurrent severe without psychotic features: Secondary | ICD-10-CM

## 2020-11-14 DIAGNOSIS — F419 Anxiety disorder, unspecified: Secondary | ICD-10-CM

## 2020-11-16 ENCOUNTER — Telehealth: Payer: Self-pay | Admitting: *Deleted

## 2020-11-16 NOTE — Telephone Encounter (Signed)
PA in Process for Duloxetine (Key: [id number])  Your information has been sent to [facility]

## 2020-11-17 NOTE — Telephone Encounter (Signed)
Approved on December 8 Request Reference Number: [id number]. DULOXETINE CAP 30MG  is approved through 11/16/2021. Your patient may now fill this prescription and it will be covered.  WM called and aware

## 2020-12-07 ENCOUNTER — Ambulatory Visit
Admission: RE | Admit: 2020-12-07 | Discharge: 2020-12-07 | Disposition: A | Discharge status: Home / Self Care | Payer: 59 | Source: Ambulatory Visit | Attending: Family Medicine | Admitting: Family Medicine

## 2020-12-07 IMAGING — MG DIGITAL SCREENING BILAT W/ TOMO W/ CAD
8 series · 8 of 24 positions shown · non-contrast
Comparison: None.

CLINICAL DATA: Screening.

EXAM:
DIGITAL SCREENING BILATERAL MAMMOGRAM WITH TOMO AND CAD

[L CC synth-2D]
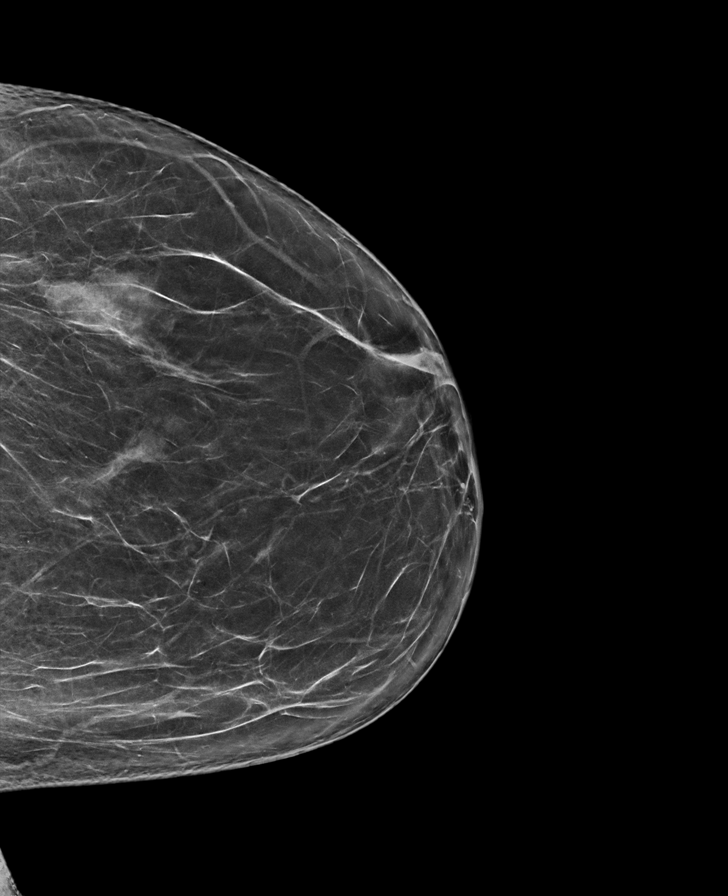

[L MLO synth-2D]
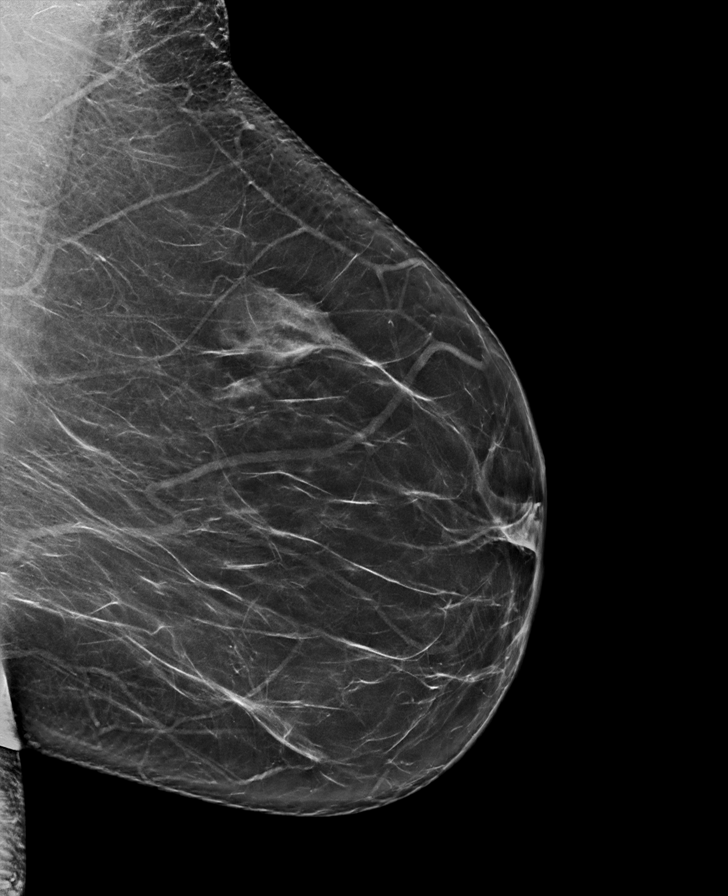

[R MLO synth-2D]
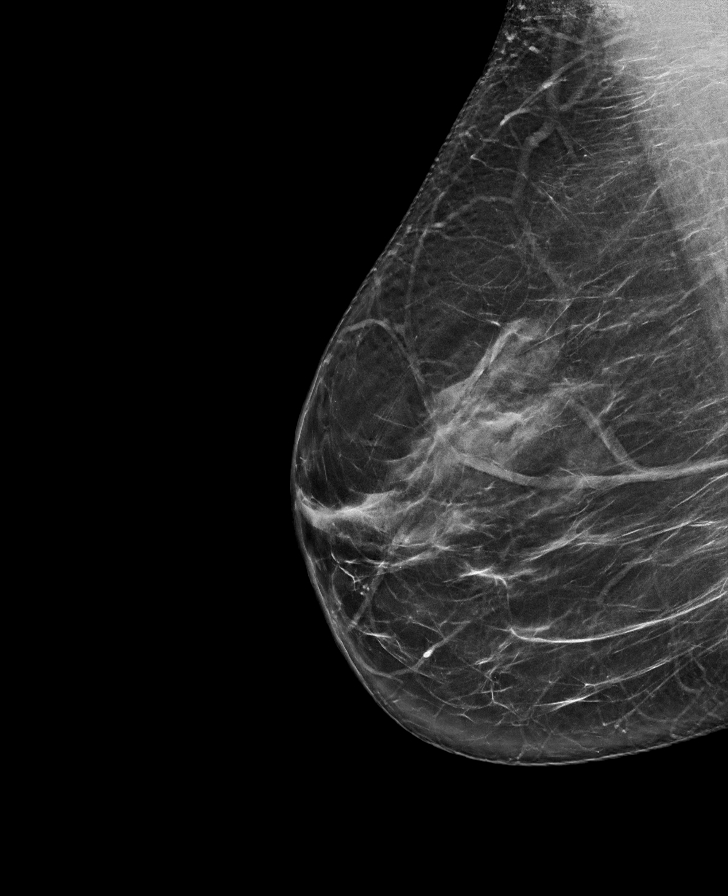

[R CC synth-2D]
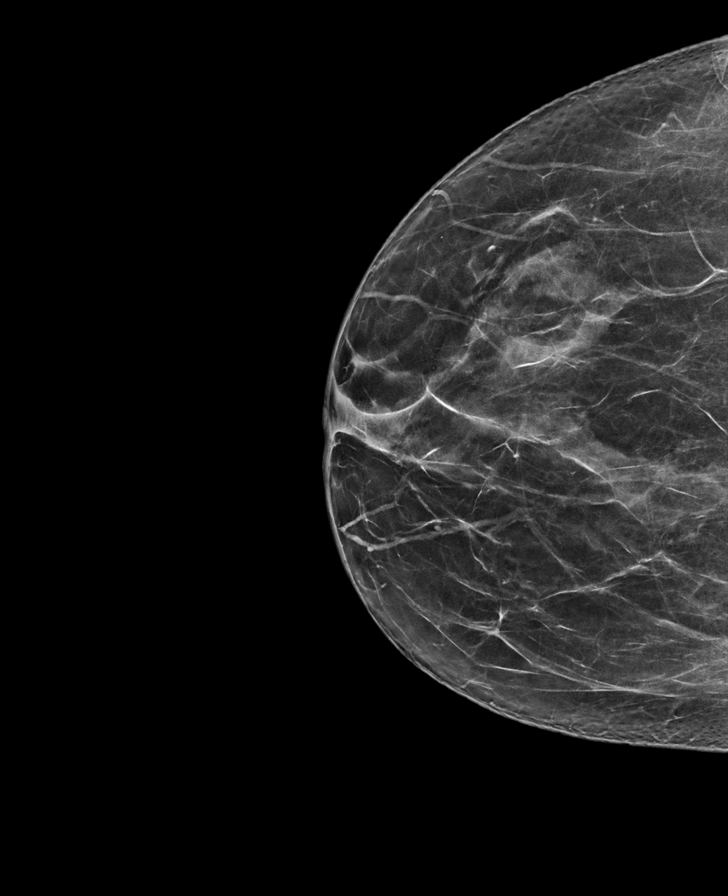

[R CC tomo · tomo slice 34/67.0]
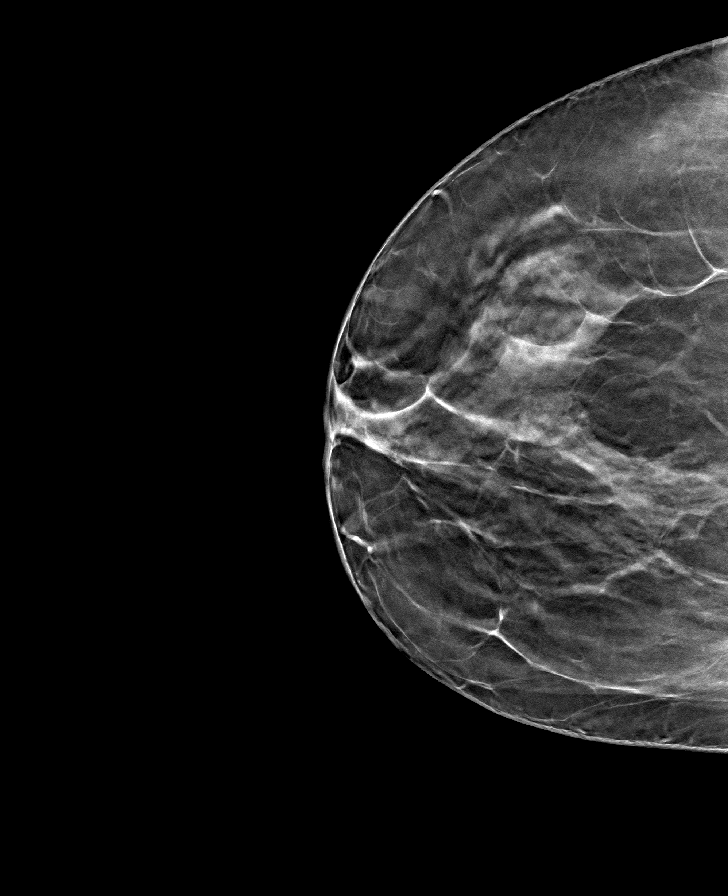

[L MLO tomo · tomo slice 41/81.0]
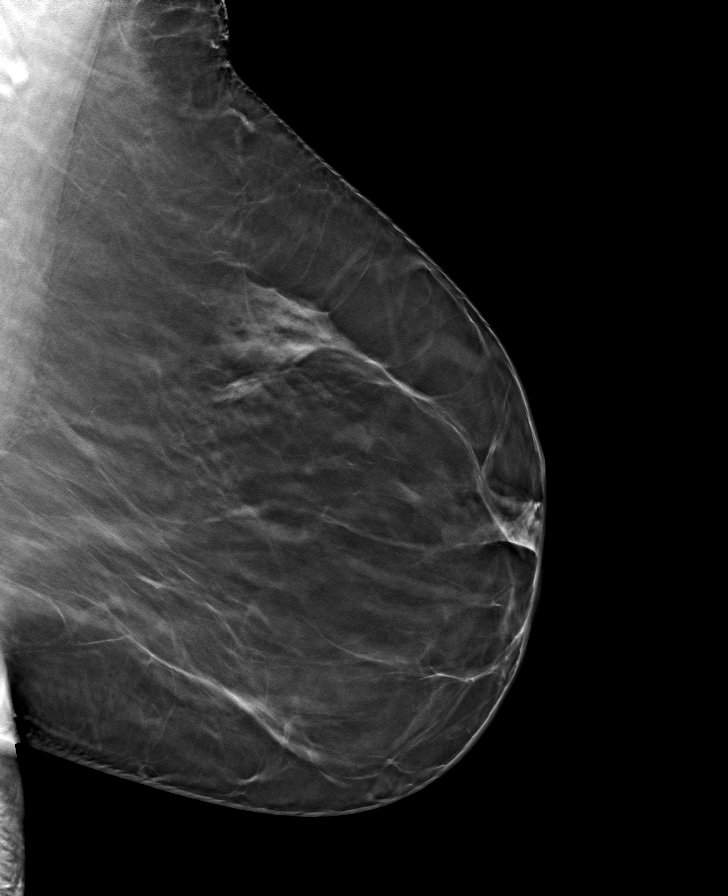

[R MLO tomo · tomo slice 39/78.0]
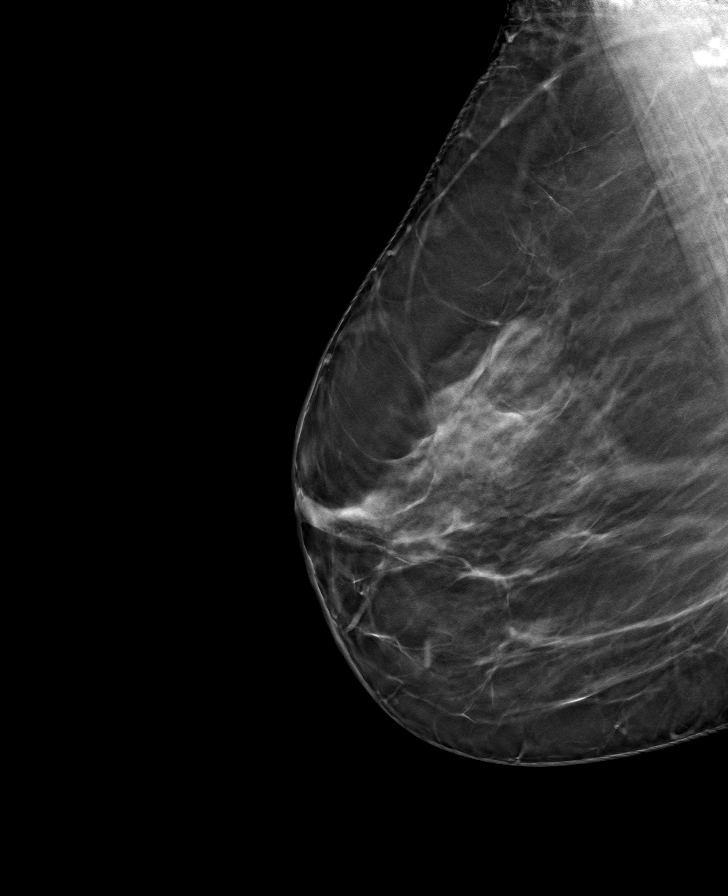

[L CC tomo · tomo slice 39/76.0]
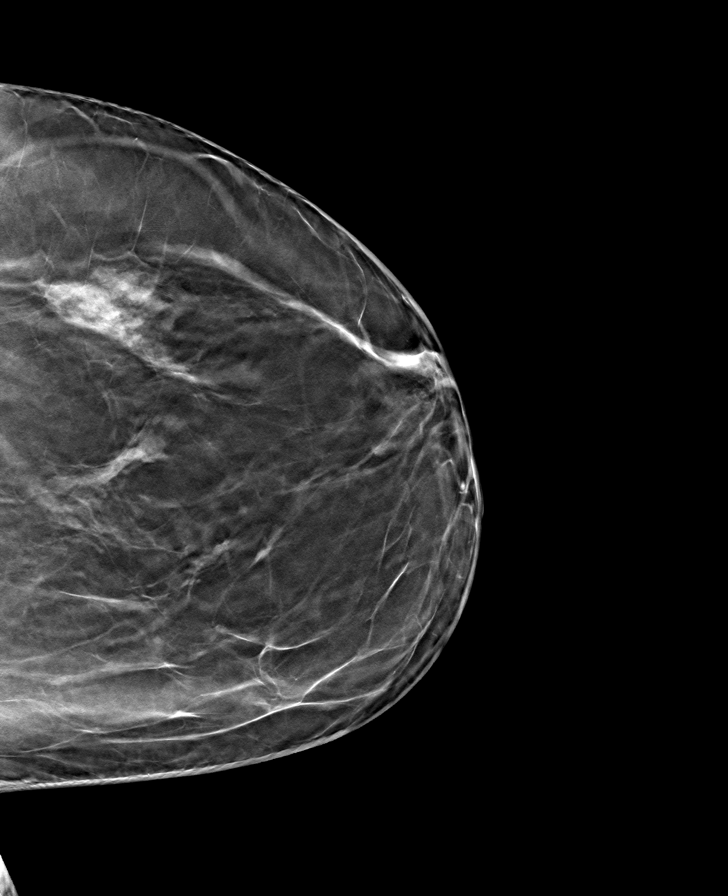

[8 of 24 positions shown; findings below may reference images not displayed]

ACR Breast Density Category b: There are scattered areas of
fibroglandular density.
FINDINGS: There are no findings suspicious for malignancy. Images were
processed with CAD.
IMPRESSION: No mammographic evidence of malignancy. A result letter of this
screening mammogram will be mailed directly to the patient.

RECOMMENDATION:
Screening mammogram in one year. (Code:[1T])

BI-RADS CATEGORY  1: Negative.

## 2020-12-15 ENCOUNTER — Other Ambulatory Visit: Payer: Self-pay | Admitting: Family Medicine

## 2020-12-15 DIAGNOSIS — F419 Anxiety disorder, unspecified: Secondary | ICD-10-CM

## 2020-12-15 DIAGNOSIS — F332 Major depressive disorder, recurrent severe without psychotic features: Secondary | ICD-10-CM

## 2020-12-26 ENCOUNTER — Encounter: Payer: Self-pay | Admitting: Family Medicine

## 2020-12-28 ENCOUNTER — Ambulatory Visit (INDEPENDENT_AMBULATORY_CARE_PROVIDER_SITE_OTHER): Payer: 59

## 2020-12-28 ENCOUNTER — Other Ambulatory Visit: Payer: Self-pay

## 2020-12-28 ENCOUNTER — Ambulatory Visit: Payer: 59 | Admitting: Family Medicine

## 2020-12-28 ENCOUNTER — Encounter: Payer: Self-pay | Admitting: Family Medicine

## 2020-12-28 VITALS — BP 105/77 | HR 84 | Temp 98.0°F | Ht 66.0 in | Wt 192.0 lb

## 2020-12-28 DIAGNOSIS — F5101 Primary insomnia: Secondary | ICD-10-CM

## 2020-12-28 DIAGNOSIS — R296 Repeated falls: Secondary | ICD-10-CM

## 2020-12-28 DIAGNOSIS — M25562 Pain in left knee: Secondary | ICD-10-CM | POA: Diagnosis not present

## 2020-12-28 DIAGNOSIS — R29898 Other symptoms and signs involving the musculoskeletal system: Secondary | ICD-10-CM

## 2020-12-28 DIAGNOSIS — G8929 Other chronic pain: Secondary | ICD-10-CM | POA: Diagnosis not present

## 2020-12-28 DIAGNOSIS — Z8673 Personal history of transient ischemic attack (TIA), and cerebral infarction without residual deficits: Secondary | ICD-10-CM

## 2020-12-28 IMAGING — DX DG KNEE 1-2V*L*
2 series · 2 of 2 positions shown · non-contrast
Comparison: None.

CLINICAL DATA: Left knee pain after multiple falls.

EXAM:
LEFT KNEE - 1-2 VIEW

[knee ap]
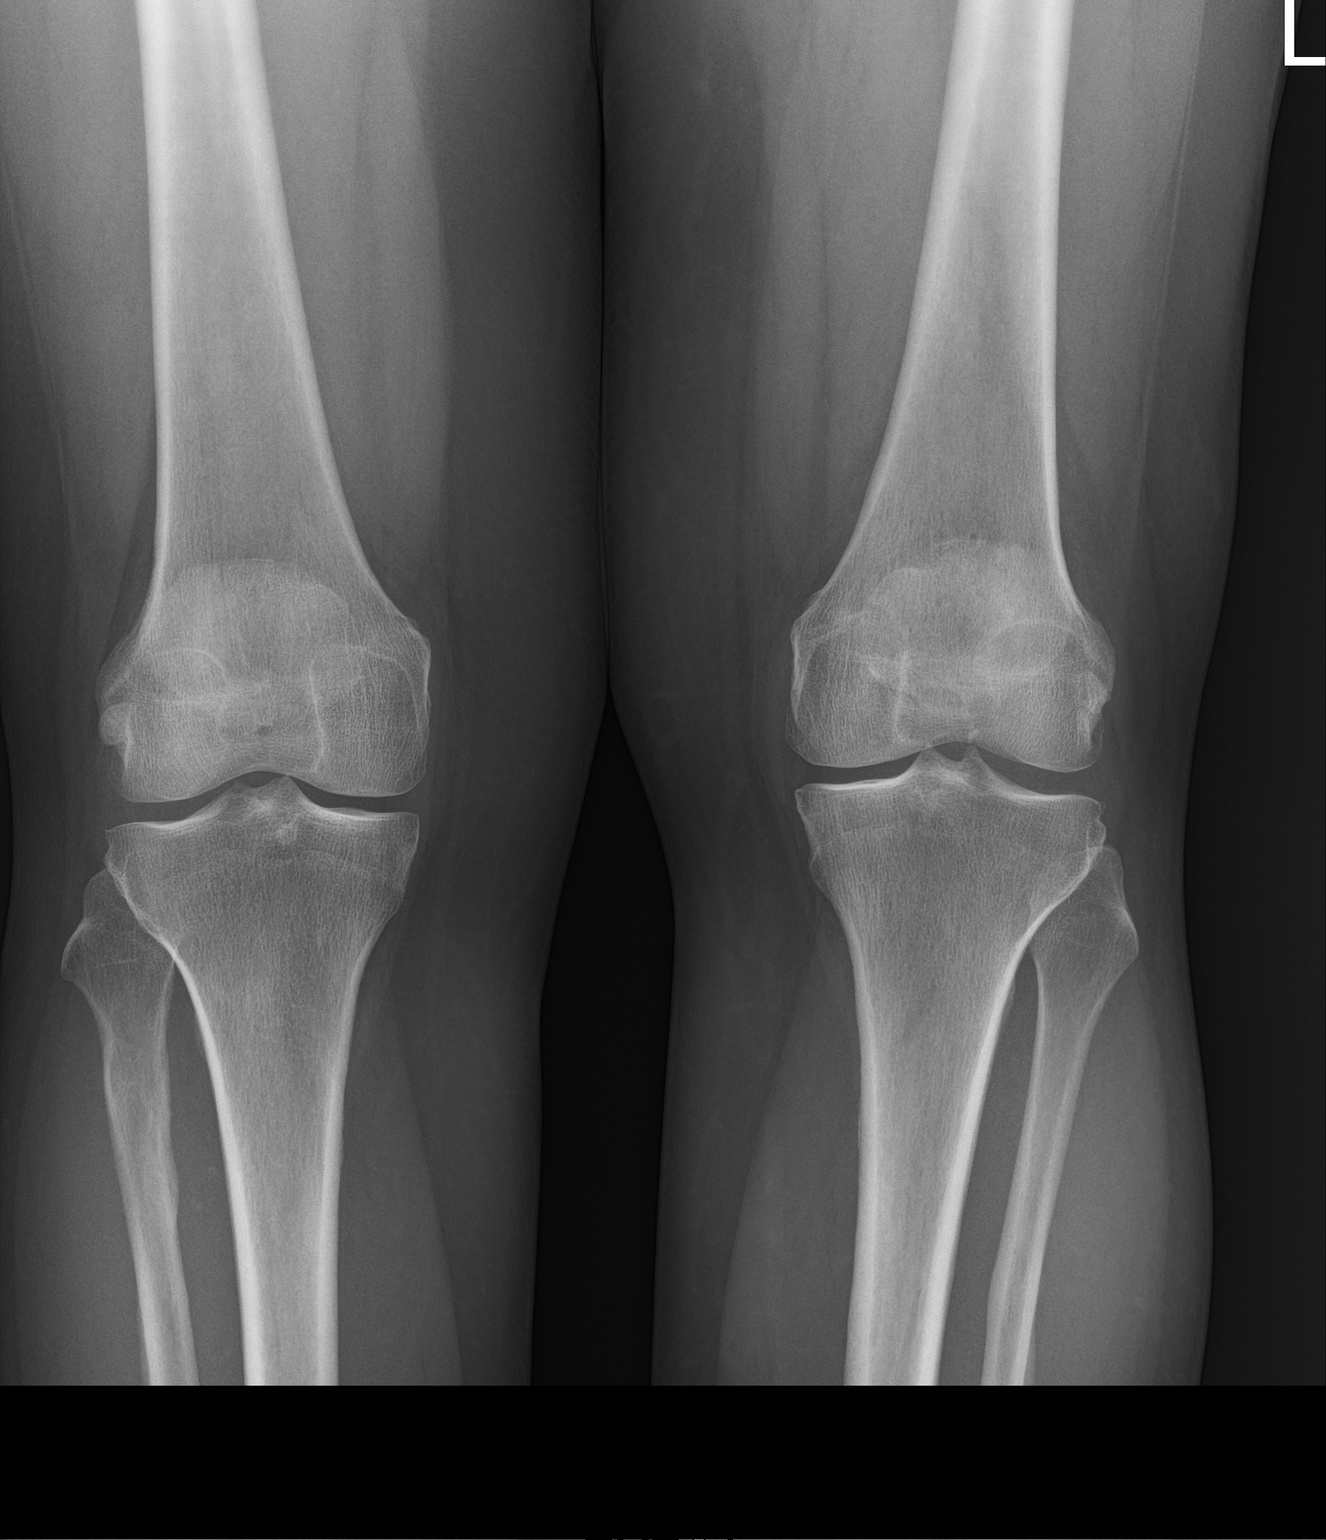

[knee lat]
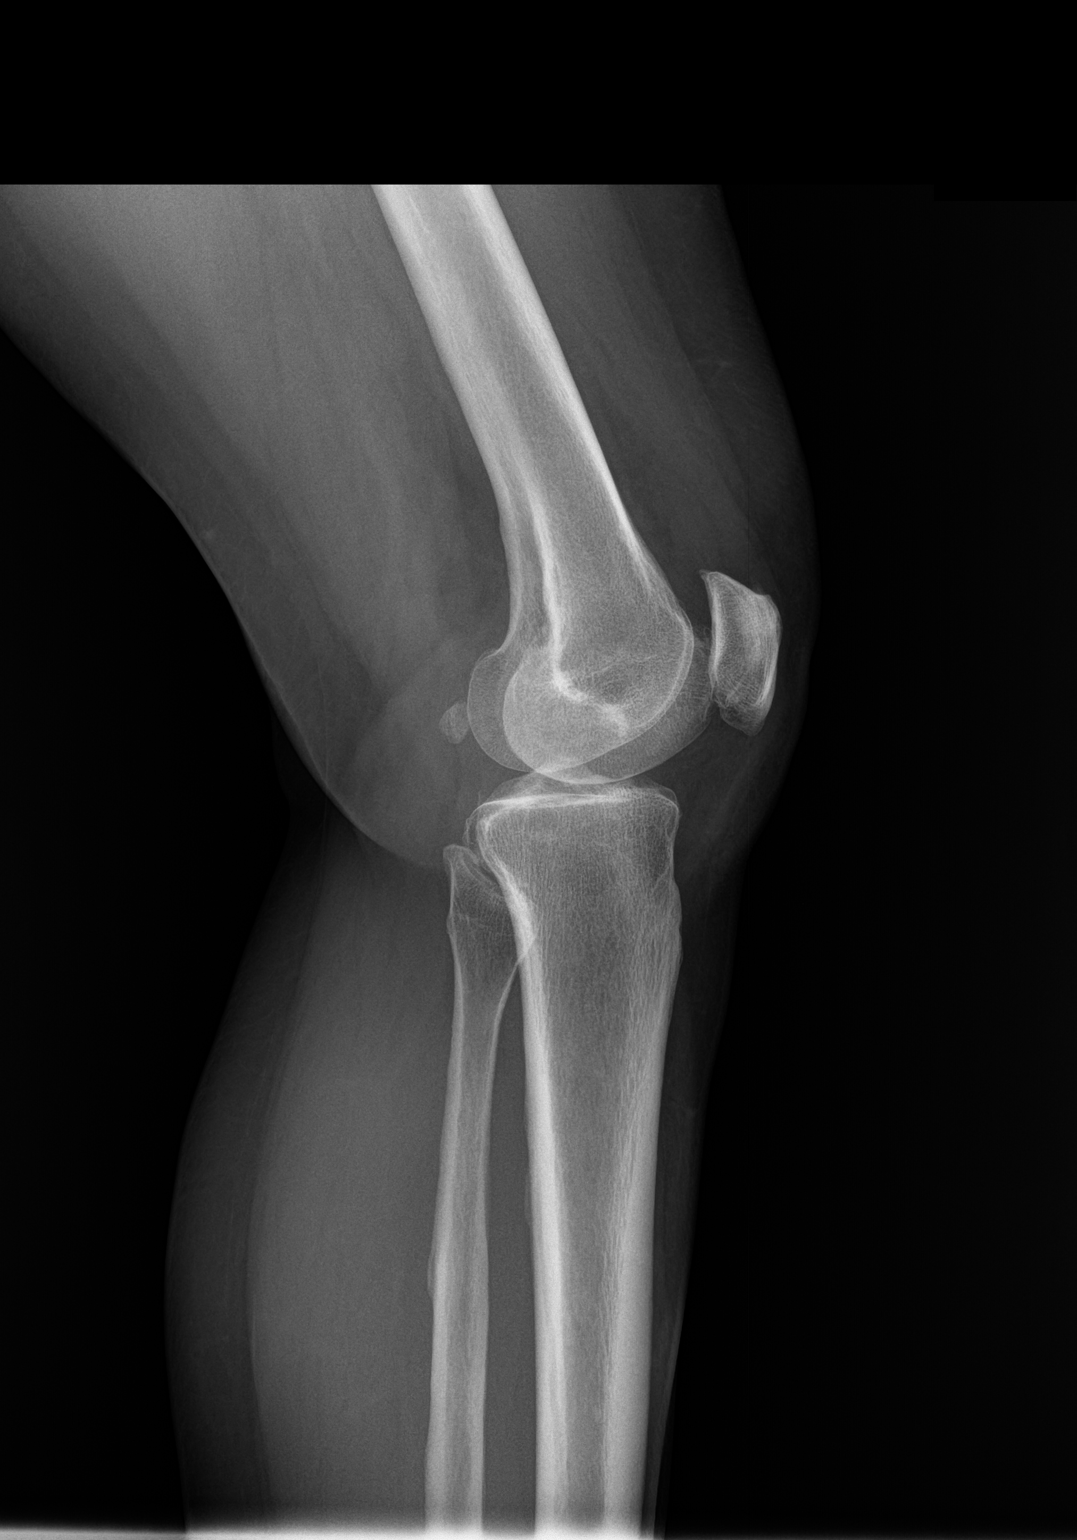

[2 of 2 positions shown; findings below may reference images not displayed]

FINDINGS: No evidence of fracture, dislocation, or joint effusion. No evidence
of arthropathy or other focal bone abnormality. Soft tissues are
unremarkable.
IMPRESSION: Negative.

## 2020-12-28 MED ORDER — TEMAZEPAM 30 MG PO CAPS: 30.0000 mg | ORAL_CAPSULE | Freq: Every day | ORAL | 5 refills | Status: DC

## 2020-12-28 NOTE — Progress Notes (Unsigned)
Assessment & Plan:  1. Primary insomnia - Well controlled on current regimen.  - temazepam (RESTORIL) 30 MG capsule; Take 1 capsule (30 mg total) by mouth at bedtime.  Dispense: 30 capsule; Refill: 5  2. Chronic pain of left knee - DG Knee 1-2 Views Left - Ambulatory referral to Orthopedic Surgery - Ambulatory referral to Physical Therapy  3. Frequent falls - Ambulatory referral to Orthopedic Surgery - Ambulatory referral to Physical Therapy  4. History of CVA (cerebrovascular accident) - Ambulatory referral to Orthopedic Surgery - Ambulatory referral to Physical Therapy  5. Weakness of left lower extremity - Ambulatory referral to Orthopedic Surgery - Ambulatory referral to Physical Therapy   Return in about 6 months (around 06/27/2021) for follow-up of chronic medication conditions.  Deliah Boston, MSN, APRN, FNP-C [facility]  Subjective:    [name], female    DOB: 1971/06/02, 50 y.o.   MRN: [id number]  Patient Care Team: Gwenlyn Fudge, FNP as PCP - General (Family Medicine)   Chief Complaint:  Chief Complaint  Patient presents with  . Depression    Check up of chronic medical conditions   . Knee Pain    Patient states she has been having ongoing left knee pain     HPI: Olivia Zamora is a 50 y.o. female presenting on 12/28/2020 for Depression (Check up of chronic medical conditions/) and Knee Pain (Patient states she has been having ongoing left knee pain/)  Patient is here for a refill of her Restoril which she is sleeping well with.  New complaints: Patient reports trouble with her left leg and knee.  She states her balance is off and she has had multiple falls.  She has fallen 4 times this week alone and today is only Wednesday.  She states she has had trouble with her leg and knee for a couple of years and was seeing an orthopedic previously, but her symptoms are getting worse.  She states she was told previously  that she had significant arthritis in the left knee and would likely need a knee replacement in the future.  She was also told she had atrophy of the muscles in her left leg due to a stroke.  She was unable to complete physical therapy at the time as she could not afford it.  They recommended a knee brace that she did not wear as she did not like the look of it.  Currently she reports her left thigh will start shaking and she will feel the weakness prior to falling.  She also feels like her left knee pops out and floats.  Other times she feels her knee is stiff and will not straighten.  She reports at times she has swelling all around her knee although there is none present today.  She has been applying Voltaren and IcyHot.  The pain and weakness on the left is causing her to walk differently and causing pain in her low back.   Social history:  Relevant past medical, surgical, family and social history reviewed and updated as indicated. Interim medical history since our last visit reviewed.  Allergies and medications reviewed and updated.  DATA REVIEWED: CHART IN EPIC  ROS: Negative unless specifically indicated above in HPI.    Current Outpatient Medications:  .  albuterol (ACCUNEB) 0.63 MG/3ML nebulizer solution, Take 1 ampule by nebulization every 6 (six) hours as needed for wheezing., Disp: , Rfl:  .  aspirin EC 81 MG tablet, Take 81  mg by mouth daily., Disp: , Rfl:  .  b complex vitamins tablet, Take 1 tablet by mouth daily., Disp: , Rfl:  .  docusate sodium (COLACE) 100 MG capsule, Take 100 mg by mouth., Disp: , Rfl:  .  DULoxetine (CYMBALTA) 30 MG capsule, Take 1 capsule by mouth twice daily, Disp: 60 capsule, Rfl: 0 .  lactulose (CHRONULAC) 10 GM/15ML solution, Take 15 mLs (10 g total) by mouth daily as needed for mild constipation., Disp: 473 mL, Rfl: 1 .  Multiple Vitamins-Minerals (MULTIVITAMIN ADULT PO), Take 1 tablet by mouth daily., Disp: , Rfl:  .  polyethylene glycol (MIRALAX /  GLYCOLAX) 17 g packet, Take 17 g by mouth., Disp: , Rfl:  .  Riboflavin (VITAMIN B-2 PO), Take 1 tablet by mouth daily., Disp: , Rfl:  .  SYNTHROID 100 MCG tablet, Take 1 tablet by mouth daily at 2 PM., Disp: , Rfl:  .  SYNTHROID 112 MCG tablet, Take 112 mcg by mouth daily., Disp: , Rfl:  .  temazepam (RESTORIL) 30 MG capsule, Take 1 capsule (30 mg total) by mouth at bedtime., Disp: 30 capsule, Rfl: 2 .  Vitamin D, Ergocalciferol, (DRISDOL) 1.25 MG (50000 UT) CAPS capsule, Take 50,000 Units by mouth every 7 (seven) days., Disp: , Rfl:    Allergies  Allergen Reactions  . Iodine Anaphylaxis  . Latex Anaphylaxis   Past Medical History:  Diagnosis Date  . Anxiety    Failed therapy with Paxil, Lexparo, Prozac, Wellbutrin, Xanax, and Ativan  . Arthritis   . Asthma   . Cardiac disease    Angina  . Chronic back pain   . Constipation 08/26/2019  . Depression    Failed therapy with Paxil, Lexparo, Prozac, Wellbutrin, and Ativan  . Disease of thyroid gland 12/28/2011   multiple thyroid nodules in 2013 - [place] on 03/08/14 showed heterogeneous appearance of thyroid gland without focal nodule  . Eczema   . Generalized seizure (HCC)    stress related  . GERD (gastroesophageal reflux disease)   . History of DVT (deep vein thrombosis) 1988  . Hypertension   . IBS (irritable bowel syndrome)   . Internal derangement of left knee   . Lactose intolerance   . Migraine   . Sleep apnea    wears CPAP  . Vitamin D deficiency     Past Surgical History:  Procedure Laterality Date  . ABDOMINAL HYSTERECTOMY    . CESAREAN SECTION     x3  . COLONOSCOPY    . HEMANGIOMA EXCISION Right    shoulder  . MASS EXCISION Right    lump from palm of hand  . OOPHORECTOMY    . OTHER SURGICAL HISTORY Right    Repair of tib-fib fracture  . TUBAL LIGATION    . TUMOR EXCISION Right    Axilla - benign  . VASCULAR SURGERY Right 1989   blood clot removed from neck    Social History   Socioeconomic History  .  Marital status: Married    Spouse name: Not on file  . Number of children: Not on file  . Years of education: Not on file  . Highest education level: Not on file  Occupational History  . Not on file  Tobacco Use  . Smoking status: Never Smoker  . Smokeless tobacco: Never Used  Substance and Sexual Activity  . Alcohol use: Never  . Drug use: Never  . Sexual activity: Not on file  Other Topics Concern  . Not on  file  Social History Narrative  . Not on file   Social Determinants of Health   Financial Resource Strain: Not on file  Food Insecurity: Not on file  Transportation Needs: Not on file  Physical Activity: Not on file  Stress: Not on file  Social Connections: Not on file  Intimate Partner Violence: Not on file        Objective:    BP 105/77   Pulse 84   Temp 98 F (36.7 C) (Temporal)   Ht 5\' 6"  (1.676 m)   Wt 192 lb (87.1 kg)   SpO2 92%   BMI 30.99 kg/m   Wt Readings from Last 3 Encounters:  12/28/20 192 lb (87.1 kg)  08/05/20 193 lb 12.8 oz (87.9 kg)  08/21/19 186 lb (84.4 kg)    Physical Exam Vitals reviewed.  Constitutional:      General: She is not in acute distress.    Appearance: Normal appearance. She is not ill-appearing, toxic-appearing or diaphoretic.  HENT:     Head: Normocephalic and atraumatic.  Eyes:     General: No scleral icterus.       Right eye: No discharge.        Left eye: No discharge.     Conjunctiva/sclera: Conjunctivae normal.  Cardiovascular:     Rate and Rhythm: Normal rate.  Pulmonary:     Effort: Pulmonary effort is normal. No respiratory distress.  Musculoskeletal:        General: Normal range of motion.     Cervical back: Normal range of motion.     Left knee: Bony tenderness present. No swelling, deformity, effusion, erythema, ecchymosis, lacerations or crepitus. Normal range of motion. Tenderness present. No LCL laxity, MCL laxity, ACL laxity or PCL laxity.Normal alignment, normal meniscus and normal patellar  mobility. Normal pulse.     Instability Tests: Anterior drawer test negative. Posterior drawer test negative. Anterior Lachman test negative. Medial McMurray test negative and lateral McMurray test negative.  Skin:    General: Skin is warm and dry.     Capillary Refill: Capillary refill takes less than 2 seconds.  Neurological:     General: No focal deficit present.     Mental Status: She is alert and oriented to person, place, and time. Mental status is at baseline.  Psychiatric:        Mood and Affect: Mood normal.        Behavior: Behavior normal.        Thought Content: Thought content normal.        Judgment: Judgment normal.     Lab Results  Component Value Date   TSH 0.110 (L) 08/21/2019   Lab Results  Component Value Date   WBC 4.4 08/21/2019   HGB 14.9 08/21/2019   HCT 44.2 08/21/2019   MCV 89 08/21/2019   PLT 164 08/21/2019   Lab Results  Component Value Date   NA 142 08/21/2019   K 4.4 08/21/2019   CO2 21 08/21/2019   GLUCOSE 102 (H) 08/21/2019   BUN 12 08/21/2019   CREATININE 0.60 08/21/2019   BILITOT 0.8 08/21/2019   ALKPHOS 67 08/21/2019   AST 33 08/21/2019   ALT 37 (H) 08/21/2019   PROT 6.8 08/21/2019   ALBUMIN 4.7 08/21/2019   CALCIUM 9.6 08/21/2019   No results found for: CHOL No results found for: HDL No results found for: LDLCALC No results found for: TRIG No results found for: CHOLHDL No results found for: 10/21/2019

## 2020-12-29 ENCOUNTER — Encounter: Payer: Self-pay | Admitting: Family Medicine

## 2020-12-30 DIAGNOSIS — R296 Repeated falls: Secondary | ICD-10-CM

## 2020-12-30 DIAGNOSIS — Z8673 Personal history of transient ischemic attack (TIA), and cerebral infarction without residual deficits: Secondary | ICD-10-CM

## 2020-12-30 DIAGNOSIS — R29898 Other symptoms and signs involving the musculoskeletal system: Secondary | ICD-10-CM

## 2020-12-30 DIAGNOSIS — G8929 Other chronic pain: Secondary | ICD-10-CM

## 2021-01-10 ENCOUNTER — Encounter: Payer: Self-pay | Admitting: Physical Therapy

## 2021-01-10 ENCOUNTER — Other Ambulatory Visit: Payer: Self-pay

## 2021-01-10 ENCOUNTER — Encounter: Payer: Self-pay | Admitting: Family Medicine

## 2021-01-10 ENCOUNTER — Ambulatory Visit: Payer: 59 | Attending: Family Medicine | Admitting: Physical Therapy

## 2021-01-10 DIAGNOSIS — M6281 Muscle weakness (generalized): Secondary | ICD-10-CM | POA: Insufficient documentation

## 2021-01-10 DIAGNOSIS — M25562 Pain in left knee: Secondary | ICD-10-CM | POA: Diagnosis present

## 2021-01-10 DIAGNOSIS — R296 Repeated falls: Secondary | ICD-10-CM | POA: Insufficient documentation

## 2021-01-10 DIAGNOSIS — G8929 Other chronic pain: Secondary | ICD-10-CM | POA: Insufficient documentation

## 2021-01-10 DIAGNOSIS — R2689 Other abnormalities of gait and mobility: Secondary | ICD-10-CM | POA: Diagnosis present

## 2021-01-10 NOTE — Therapy (Signed)
[facility] [address] [place], [place], [address] Phone: [phone]   Fax:  [phone]  Physical Therapy Evaluation  Patient Details  Name: Olivia Zamora MRN: [id number] Date of Birth: October 21, 1971 Referring Provider (PT): Deliah Boston, FNP   Encounter Date: 01/10/2021   PT End of Session - 01/10/21 1123    Visit Number 1    Number of Visits 12    Date for PT Re-Evaluation 02/28/21    Authorization Type [employer]; Progress note every 10th visit    PT Start Time 1030    PT Stop Time 1114    PT Time Calculation (min) 44 min    Equipment Utilized During Treatment Gait belt    Activity Tolerance Patient tolerated treatment well;Patient limited by pain    Behavior During Therapy [facility] for tasks assessed/performed           Past Medical History:  Diagnosis Date  . Anxiety    Failed therapy with Paxil, Lexparo, Prozac, Wellbutrin, Xanax, and Ativan  . Arthritis   . Asthma   . Cardiac disease    Angina  . Chronic back pain   . Constipation 08/26/2019  . Depression    Failed therapy with Paxil, Lexparo, Prozac, Wellbutrin, and Ativan  . Disease of thyroid gland 12/28/2011   multiple thyroid nodules in 2013 - [place] on 03/08/14 showed heterogeneous appearance of thyroid gland without focal nodule  . Eczema   . Generalized seizure (HCC)    stress related  . GERD (gastroesophageal reflux disease)   . History of DVT (deep vein thrombosis) 1988  . Hypertension   . IBS (irritable bowel syndrome)   . Internal derangement of left knee   . Lactose intolerance   . Migraine   . Sleep apnea    wears CPAP  . Vitamin D deficiency     Past Surgical History:  Procedure Laterality Date  . ABDOMINAL HYSTERECTOMY    . CESAREAN SECTION     x3  . COLONOSCOPY    . HEMANGIOMA EXCISION Right    shoulder  . MASS EXCISION Right    lump from palm of hand  . OOPHORECTOMY    . OTHER SURGICAL HISTORY Right    Repair of tib-fib fracture   . TUBAL LIGATION    . TUMOR EXCISION Right    Axilla - benign  . VASCULAR SURGERY Right 1989   blood clot removed from neck    There were no vitals filed for this visit.    Subjective Assessment - 01/10/21 1254    Subjective COVID-19 screening performed upon arrival. Patient arrives to physical therapy with reports of chronic left knee pain, difficulty walking and frequent falls that has worsened over the past 5-6 years after CVA. Patient reports 5 falls in the past week and requires assistance to come to standing. Patient reports a heaviness in left leg and a change in gait pattern particulary with longer distance walking. Patient reports pain at 7-8/10 with movement. Patient's goals are to decrease pain, improve movement, improve strength, improve ability to perfom home activities, and "get my life back so i can feel human again."    Pertinent History CVA about 2017 or 2018; asthma    Limitations Sitting;Lifting;Standing;Walking;House hold activities    How long can you sit comfortably? short periods comfortably    How long can you stand comfortably? short periods comfortably    How long can you walk comfortably? short periods comfortably    Diagnostic tests x-ray: joint  effusion, athritis present, (-) for fractures    Patient Stated Goals move around better    Currently in Pain? Yes    Pain Score 8     Pain Location Knee    Pain Orientation Left    Pain Descriptors / Indicators Aching;Sharp;Sore;Throbbing    Pain Type Chronic pain    Pain Radiating Towards Quad region to lateral knee    Pain Onset More than a month ago    Pain Frequency Constant    Aggravating Factors  "nothing specific, just constant pain and swelling"    Pain Relieving Factors "not much honestly"    Effect of Pain on Daily Activities "frequent falls, imbalance, ADLs"              [facility] PT Assessment - 01/10/21 0001      Assessment   Medical Diagnosis Chronic pain of left knee, frequent falls, history of  CVA, Weakness of left lower extremity    Referring Provider (PT) Deliah Boston, FNP    Onset Date/Surgical Date --   chronic, about 5-6 years ago   Next MD Visit "6 months"    Prior Therapy no      Precautions   Precautions Fall      Restrictions   Weight Bearing Restrictions No      Balance Screen   Has the patient fallen in the past 6 months Yes    How many times? 5    Has the patient had a decrease in activity level because of a fear of falling?  Yes    Is the patient reluctant to leave their home because of a fear of falling?  Yes      Home Environment   Living Environment Private residence    Living Arrangements Spouse/significant other;Children      Prior Function   Level of Independence Independent with basic ADLs      Observation/Other Assessments   Observations L knee flexion contracture, right LE externally rotated in standing      ROM / Strength   AROM / PROM / Strength Strength;AROM;PROM      AROM   Overall AROM  Deficits;Due to pain    AROM Assessment Site Knee    Right/Left Knee Left    Left Knee Extension 6    Left Knee Flexion 108      PROM   Overall PROM  Deficits;Due to pain    PROM Assessment Site Knee    Right/Left Knee Left    Left Knee Extension 4    Left Knee Flexion 130      Strength   Overall Strength Deficits    Strength Assessment Site Hip;Knee    Right/Left Hip Right;Left    Right Hip Flexion 3+/5    Right Hip Extension 4-/5    Right Hip ABduction 4-/5    Left Hip Flexion 3+/5    Left Hip Extension 3+/5    Left Hip ABduction 3+/5    Right/Left Knee Right;Left    Right Knee Flexion 4/5    Right Knee Extension 4+/5    Left Knee Flexion 4-/5    Left Knee Extension 4-/5      Palpation   Patella mobility decreased left patella mobiity    Palpation comment tenderness throughout left knee and around quad and distal hamstrings      Transfers   Five time sit to stand comments  19 seconds with intermittent UE support    Comments  independent with bed mobility  Ambulation/Gait   Gait Pattern Step-through pattern;Decreased stride length;Decreased stance time - left;Decreased step length - right;Left flexed knee in stance;Decreased weight shift to left;Decreased hip/knee flexion - left;Antalgic;Trendelenburg;Abducted- right      Standardized Balance Assessment   Standardized Balance Assessment Berg Balance Test      Berg Balance Test   Berg comment: to be completed next visit                      Objective measurements completed on examination: See above findings.               PT Education - 01/10/21 1122    Education Details bridge, SLR, pillow squeeze, sit to stand    Person(s) Educated Patient    Methods Explanation;Demonstration;Handout    Comprehension Verbalized understanding               PT Long Term Goals - 01/10/21 1804      PT LONG TERM GOAL #1   Title Patient will be independent with HEP and its progression.    Time 6    Period Weeks    Status New      PT LONG TERM GOAL #2   Title Patient will perform modified 5x sit to stand with UE support in 15 seconds or less to decrease fall risk.    Time 6    Period Weeks    Status New      PT LONG TERM GOAL #3   Title Patient will demonstrate a 4+ point improvement on the Berg Balance Scale to decrease risk of falls.    Time 6    Period Weeks    Status New      PT LONG TERM GOAL #4   Title Patient will report ability to perform ADLs with left knee pain less than or equal to 4/10.    Time 6    Period Weeks    Status New      PT LONG TERM GOAL #5   Title Patient will demonstrate 115+ degrees of right knee flexion AROM to improve ability to perform functional tasks.    Time 6    Period Weeks    Status New                  Plan - 01/10/21 1249    Clinical Impression Statement Patient is a 50 year old female who presents to physical therapy with bilateral LE weakness L>R, difficulty walking, and  decreased balance that began progressing since CVA about 5-6 years ago. Patient's modified 5x sit to stand with UE support categorizes her as a fall risk with decreased functional LE strength. Patient with decreased left knee patella mobilization. Patient and PT discussed plan of care and HEP to which she reported understanding. Berg Balance Scale to be completed next visit to assess fall risk. Patient would benefit from skilld physical therapy to address deficits and address patient's goals.    Personal Factors and Comorbidities Age;Comorbidity 3+    Comorbidities history of CVA, Asthma, anxiety, chronic back pain, GERD; latex allergy    Examination-Activity Limitations Bathing;Locomotion Level;Transfers;Squat;Stairs;Stand;Sit;Bed Mobility    Stability/Clinical Decision Making Stable/Uncomplicated    Clinical Decision Making Moderate    Rehab Potential Fair    PT Frequency 2x / week    PT Duration 6 weeks    PT Treatment/Interventions ADLs/Self Care Home Management;Cryotherapy;Electrical Stimulation;Moist Heat;Ultrasound;Gait training;Stair training;Functional mobility training;Therapeutic activities;Therapeutic exercise;Balance training;Neuromuscular re-education;Manual techniques;Passive range of motion;Patient/family education;Vasopneumatic Device  PT Next Visit Plan complete berg balance scale; Nustep, LE strengthening, balance activities, modalities PRN for pain relief    PT Home Exercise Plan see patient education section    Consulted and Agree with Plan of Care Patient           Patient will benefit from skilled therapeutic intervention in order to improve the following deficits and impairments:  Abnormal gait,Decreased activity tolerance,Decreased balance,Decreased strength,Increased edema,Pain,Difficulty walking,Decreased range of motion  Visit Diagnosis: Chronic pain of left knee  Muscle weakness (generalized)  Repeated falls  Other abnormalities of gait and  mobility     Problem List Patient Active Problem List   Diagnosis Date Noted  . Constipation 08/26/2019  . Vitamin D deficiency   . Sleep apnea   . Depression   . Anxiety   . Asthma   . Disease of thyroid gland 12/28/2011    Guss Bunde, PT, DPT 01/10/2021, 6:07 PM  [facility] [address] [place], [place], [address] Phone: [phone]   Fax:  [phone]  Name: Radwa Fellinger MRN: [id number] Date of Birth: 08-18-1971

## 2021-01-11 ENCOUNTER — Other Ambulatory Visit: Payer: Self-pay | Admitting: Family Medicine

## 2021-01-11 ENCOUNTER — Encounter: Payer: Self-pay | Admitting: Family Medicine

## 2021-01-11 DIAGNOSIS — F419 Anxiety disorder, unspecified: Secondary | ICD-10-CM

## 2021-01-11 DIAGNOSIS — F332 Major depressive disorder, recurrent severe without psychotic features: Secondary | ICD-10-CM

## 2021-01-12 ENCOUNTER — Ambulatory Visit: Payer: 59 | Admitting: Physical Therapy

## 2021-01-12 ENCOUNTER — Other Ambulatory Visit: Payer: Self-pay

## 2021-01-12 ENCOUNTER — Encounter: Payer: Self-pay | Admitting: Physical Therapy

## 2021-01-12 DIAGNOSIS — R2689 Other abnormalities of gait and mobility: Secondary | ICD-10-CM

## 2021-01-12 DIAGNOSIS — M25562 Pain in left knee: Secondary | ICD-10-CM

## 2021-01-12 DIAGNOSIS — G8929 Other chronic pain: Secondary | ICD-10-CM

## 2021-01-12 DIAGNOSIS — R296 Repeated falls: Secondary | ICD-10-CM

## 2021-01-12 DIAGNOSIS — M6281 Muscle weakness (generalized): Secondary | ICD-10-CM

## 2021-01-12 NOTE — Therapy (Signed)
[facility] [address] [place], [place], [address] Phone: [phone]   Fax:  [phone]  Physical Therapy Treatment  Patient Details  Name: Olivia Zamora MRN: [id number] Date of Birth: [birth year] Referring Provider (PT): Deliah Boston, FNP   Encounter Date: 01/12/2021   PT End of Session - 01/12/21 1244    Visit Number 2    Number of Visits 12    Date for PT Re-Evaluation 02/28/21    Authorization Type [employer]; Progress note every 10th visit    PT Start Time 1115    PT Stop Time 1201    PT Time Calculation (min) 46 min    Activity Tolerance Patient tolerated treatment well    Behavior During Therapy [facility] for tasks assessed/performed           Past Medical History:  Diagnosis Date  . Anxiety    Failed therapy with Paxil, Lexparo, Prozac, Wellbutrin, Xanax, and Ativan  . Arthritis   . Asthma   . Cardiac disease    Angina  . Chronic back pain   . Constipation 08/26/2019  . Depression    Failed therapy with Paxil, Lexparo, Prozac, Wellbutrin, and Ativan  . Disease of thyroid gland 12/28/2011   multiple thyroid nodules in 2013 - [place] on 03/08/14 showed heterogeneous appearance of thyroid gland without focal nodule  . Eczema   . Generalized seizure (HCC)    stress related  . GERD (gastroesophageal reflux disease)   . History of DVT (deep vein thrombosis) 1988  . Hypertension   . IBS (irritable bowel syndrome)   . Internal derangement of left knee   . Lactose intolerance   . Migraine   . Sleep apnea    wears CPAP  . Vitamin D deficiency     Past Surgical History:  Procedure Laterality Date  . ABDOMINAL HYSTERECTOMY    . CESAREAN SECTION     x3  . COLONOSCOPY    . HEMANGIOMA EXCISION Right    shoulder  . MASS EXCISION Right    lump from palm of hand  . OOPHORECTOMY    . OTHER SURGICAL HISTORY Right    Repair of tib-fib fracture  . TUBAL LIGATION    . TUMOR EXCISION Right    Axilla - benign  .  VASCULAR SURGERY Right 1989   blood clot removed from neck    There were no vitals filed for this visit.   Subjective Assessment - 01/12/21 1118    Subjective COVID-19 screening performed upon arrival. Patient arrives doing well with minimal reports of pain.    Pertinent History CVA about 2017 or 2018; asthma    Limitations Sitting;Lifting;Standing;Walking;House hold activities    How long can you sit comfortably? short periods comfortably    How long can you stand comfortably? short periods comfortably    How long can you walk comfortably? short periods comfortably    Diagnostic tests x-ray: joint effusion, athritis present, (-) for fractures    Patient Stated Goals move around better    Currently in Pain? Yes    Pain Orientation Left    Pain Descriptors / Indicators Aching    Pain Type Chronic pain    Pain Onset More than a month ago    Pain Frequency Constant              [facility] PT Assessment - 01/12/21 0001      Assessment   Medical Diagnosis Chronic pain of left knee, frequent falls, history of CVA,  Weakness of left lower extremity    Referring Provider (PT) Deliah Boston, FNP      Ambulation/Gait   Gait velocity 1.11 m/s2, community ambulator      Standardized Balance Assessment   Standardized Balance Assessment Berg Balance Test      Berg Balance Test   Sit to Stand Able to stand  independently using hands    Standing Unsupported Able to stand safely 2 minutes    Sitting with Back Unsupported but Feet Supported on Floor or Stool Able to sit safely and securely 2 minutes    Stand to Sit Controls descent by using hands    Transfers Able to transfer safely, definite need of hands    Standing Unsupported with Eyes Closed Able to stand 10 seconds with supervision    Standing Unsupported with Feet Together Able to place feet together independently and stand for 1 minute with supervision    From Standing, Reach Forward with Outstretched Arm Can reach forward >12 cm  safely (5")    From Standing Position, Pick up Object from Floor Able to pick up shoe, needs supervision    From Standing Position, Turn to Look Behind Over each Shoulder Looks behind one side only/other side shows less weight shift    Turn 360 Degrees Able to turn 360 degrees safely one side only in 4 seconds or less    Standing Unsupported, Alternately Place Feet on Step/Stool Able to stand independently and complete 8 steps >20 seconds    Standing Unsupported, One Foot in Front Able to plae foot ahead of the other independently and hold 30 seconds    Standing on One Leg Able to lift leg independently and hold > 10 seconds    Total Score 45                         [facility] Adult PT Treatment/Exercise - 01/12/21 0001      Exercises   Exercises Knee/Hip      Knee/Hip Exercises: Supine   Short Arc Quad Sets AROM;Both;2 sets;10 reps    Bridges Both;2 sets;10 reps    Straight Leg Raises AROM;Both;2 sets;10 reps    Other Supine Knee/Hip Exercises clam shells yellow theraband 2x10; supine marching with yellow theraband 2x10                       PT Long Term Goals - 01/10/21 1804      PT LONG TERM GOAL #1   Title Patient will be independent with HEP and its progression.    Time 6    Period Weeks    Status New      PT LONG TERM GOAL #2   Title Patient will perform modified 5x sit to stand with UE support in 15 seconds or less to decrease fall risk.    Time 6    Period Weeks    Status New      PT LONG TERM GOAL #3   Title Patient will demonstrate a 4+ point improvement on the Berg Balance Scale to decrease risk of falls.    Time 6    Period Weeks    Status New      PT LONG TERM GOAL #4   Title Patient will report ability to perform ADLs with left knee pain less than or equal to 4/10.    Time 6    Period Weeks    Status New  PT LONG TERM GOAL #5   Title Patient will demonstrate 115+ degrees of right knee flexion AROM to improve ability to perform  functional tasks.    Time 6    Period Weeks    Status New                 Plan - 01/12/21 1248    Clinical Impression Statement Patient responded well to therapy session with slight reports of fatigue and pain in L knee. Patient's Berg Balance Scale of 45/56 categotizes her as a mild fall risk. Patient guided through supine exercises with good technique after verbal cuing and explanation. Patient instructed to continue HEP as prescribed and if she feels like she can do a little more she can attempt but not to over do it.    Personal Factors and Comorbidities Age;Comorbidity 3+    Comorbidities history of CVA, Asthma, anxiety, chronic back pain, GERD; latex allergy    Examination-Activity Limitations Bathing;Locomotion Level;Transfers;Squat;Stairs;Stand;Sit;Bed Mobility    Stability/Clinical Decision Making Stable/Uncomplicated    Clinical Decision Making Moderate    Rehab Potential Fair    PT Frequency 2x / week    PT Duration 6 weeks    PT Treatment/Interventions ADLs/Self Care Home Management;Cryotherapy;Electrical Stimulation;Moist Heat;Ultrasound;Gait training;Stair training;Functional mobility training;Therapeutic activities;Therapeutic exercise;Balance training;Neuromuscular re-education;Manual techniques;Passive range of motion;Patient/family education;Vasopneumatic Device    PT Next Visit Plan complete berg balance scale; Nustep, LE strengthening, balance activities, modalities PRN for pain relief    PT Home Exercise Plan see patient education section    Consulted and Agree with Plan of Care Patient           Patient will benefit from skilled therapeutic intervention in order to improve the following deficits and impairments:  Abnormal gait,Decreased activity tolerance,Decreased balance,Decreased strength,Increased edema,Pain,Difficulty walking,Decreased range of motion  Visit Diagnosis: Chronic pain of left knee  Muscle weakness (generalized)  Repeated falls  Other  abnormalities of gait and mobility     Problem List Patient Active Problem List   Diagnosis Date Noted  . Constipation 08/26/2019  . Vitamin D deficiency   . Sleep apnea   . Depression   . Anxiety   . Asthma   . Disease of thyroid gland 12/28/2011    Guss Bunde, PT, DPT 01/12/2021, 12:53 PM  [facility] [address] [place], [place], [address] Phone: [phone]   Fax:  [phone]  Name: Sharlee Rufino MRN: [id number] Date of Birth: 03/20/1971

## 2021-01-17 ENCOUNTER — Encounter: Payer: Self-pay | Admitting: Physical Therapy

## 2021-01-17 ENCOUNTER — Ambulatory Visit: Payer: 59 | Admitting: Physical Therapy

## 2021-01-17 ENCOUNTER — Other Ambulatory Visit: Payer: Self-pay

## 2021-01-17 DIAGNOSIS — M25562 Pain in left knee: Secondary | ICD-10-CM | POA: Diagnosis not present

## 2021-01-17 DIAGNOSIS — R296 Repeated falls: Secondary | ICD-10-CM

## 2021-01-17 DIAGNOSIS — G8929 Other chronic pain: Secondary | ICD-10-CM

## 2021-01-17 DIAGNOSIS — R2689 Other abnormalities of gait and mobility: Secondary | ICD-10-CM

## 2021-01-17 DIAGNOSIS — M6281 Muscle weakness (generalized): Secondary | ICD-10-CM

## 2021-01-17 NOTE — Therapy (Signed)
[facility] [address] [place], [place], [address] Phone: [phone]   Fax:  [phone]  Physical Therapy Treatment  Patient Details  Name: Olivia Zamora MRN: [id number] Date of Birth: [birth year] Referring Provider (PT): Deliah Boston, FNP   Encounter Date: 01/17/2021   PT End of Session - 01/17/21 1759    Visit Number 3    Number of Visits 12    Date for PT Re-Evaluation 02/28/21    Authorization Type [employer]; Progress note every 10th visit    PT Start Time 1515    PT Stop Time 1400    PT Time Calculation (min) 1365 min    Activity Tolerance Patient tolerated treatment well    Behavior During Therapy [facility] for tasks assessed/performed           Past Medical History:  Diagnosis Date  . Anxiety    Failed therapy with Paxil, Lexparo, Prozac, Wellbutrin, Xanax, and Ativan  . Arthritis   . Asthma   . Cardiac disease    Angina  . Chronic back pain   . Constipation 08/26/2019  . Depression    Failed therapy with Paxil, Lexparo, Prozac, Wellbutrin, and Ativan  . Disease of thyroid gland 12/28/2011   multiple thyroid nodules in 2013 - [place] on 03/08/14 showed heterogeneous appearance of thyroid gland without focal nodule  . Eczema   . Generalized seizure (HCC)    stress related  . GERD (gastroesophageal reflux disease)   . History of DVT (deep vein thrombosis) 1988  . Hypertension   . IBS (irritable bowel syndrome)   . Internal derangement of left knee   . Lactose intolerance   . Migraine   . Sleep apnea    wears CPAP  . Vitamin D deficiency     Past Surgical History:  Procedure Laterality Date  . ABDOMINAL HYSTERECTOMY    . CESAREAN SECTION     x3  . COLONOSCOPY    . HEMANGIOMA EXCISION Right    shoulder  . MASS EXCISION Right    lump from palm of hand  . OOPHORECTOMY    . OTHER SURGICAL HISTORY Right    Repair of tib-fib fracture  . TUBAL LIGATION    . TUMOR EXCISION Right    Axilla - benign  .  VASCULAR SURGERY Right 1989   blood clot removed from neck    There were no vitals filed for this visit.   Subjective Assessment - 01/17/21 1751    Subjective COVID-19 screening performed upon arrival. Patient arrives doing alright, has on/off days but feels motivated.    Pertinent History CVA about 2017 or 2018; asthma    Limitations Sitting;Lifting;Standing;Walking;House hold activities    How long can you sit comfortably? short periods comfortably    How long can you stand comfortably? short periods comfortably    How long can you walk comfortably? short periods comfortably    Diagnostic tests x-ray: joint effusion, athritis present, (-) for fractures    Patient Stated Goals move around better    Currently in Pain? Yes   did not provide number on pain scale             [facility] PT Assessment - 01/17/21 0001      Assessment   Medical Diagnosis Chronic pain of left knee, frequent falls, history of CVA, Weakness of left lower extremity    Referring Provider (PT) Deliah Boston, FNP  [facility] Adult PT Treatment/Exercise - 01/17/21 0001      Exercises   Exercises Knee/Hip;Lumbar      Lumbar Exercises: Seated   Other Seated Lumbar Exercises scapular retraction yellow theraband x10 followed by horizontal abduction x10      Knee/Hip Exercises: Aerobic   Nustep Level 3 x12 mins      Knee/Hip Exercises: Standing   Heel Raises Both;10 reps    Heel Raises Limitations x10    Knee Flexion AROM;Both;10 reps      Knee/Hip Exercises: Seated   Long Arc Quad Strengthening;Both;2 sets;10 reps    Clamshell with TheraBand Yellow   x20   Marching Strengthening;Both;2 sets;10 reps    Marching Limitations yellow    Hamstring Curl Strengthening;Both;2 sets;10 reps    Sit to Sand 2 sets;10 reps                       PT Long Term Goals - 01/10/21 1804      PT LONG TERM GOAL #1   Title Patient will be independent with HEP and its  progression.    Time 6    Period Weeks    Status New      PT LONG TERM GOAL #2   Title Patient will perform modified 5x sit to stand with UE support in 15 seconds or less to decrease fall risk.    Time 6    Period Weeks    Status New      PT LONG TERM GOAL #3   Title Patient will demonstrate a 4+ point improvement on the Berg Balance Scale to decrease risk of falls.    Time 6    Period Weeks    Status New      PT LONG TERM GOAL #4   Title Patient will report ability to perform ADLs with left knee pain less than or equal to 4/10.    Time 6    Period Weeks    Status New      PT LONG TERM GOAL #5   Title Patient will demonstrate 115+ degrees of right knee flexion AROM to improve ability to perform functional tasks.    Time 6    Period Weeks    Status New                 Plan - 01/17/21 1800    Clinical Impression Statement Patient responded well to therapy session with verbal cuing and demonstration of exercises. Patient demonstrated good technique with all exercises with intermittent facial grimmacing but patient was able to complete all reps with a good response. Patient and PT discussed energy conservation and resting as needed while performing home activities. Patient reported understanding.    Personal Factors and Comorbidities Age;Comorbidity 3+    Comorbidities history of CVA, Asthma, anxiety, chronic back pain, GERD; latex allergy    Examination-Activity Limitations Bathing;Locomotion Level;Transfers;Squat;Stairs;Stand;Sit;Bed Mobility    Examination-Participation Restrictions Cleaning    Stability/Clinical Decision Making Stable/Uncomplicated    Clinical Decision Making Moderate    Rehab Potential Fair    PT Frequency 2x / week    PT Duration 6 weeks    PT Treatment/Interventions ADLs/Self Care Home Management;Cryotherapy;Electrical Stimulation;Moist Heat;Ultrasound;Gait training;Stair training;Functional mobility training;Therapeutic activities;Therapeutic  exercise;Balance training;Neuromuscular re-education;Manual techniques;Passive range of motion;Patient/family education;Vasopneumatic Device    PT Next Visit Plan continue Nustep, LE strengthening, balance activities, modalities PRN for pain relief    PT Home Exercise Plan see patient education section    Consulted  and Agree with Plan of Care Patient           Patient will benefit from skilled therapeutic intervention in order to improve the following deficits and impairments:  Abnormal gait,Decreased activity tolerance,Decreased balance,Decreased strength,Increased edema,Pain,Difficulty walking,Decreased range of motion  Visit Diagnosis: Chronic pain of left knee  Muscle weakness (generalized)  Other abnormalities of gait and mobility  Repeated falls     Problem List Patient Active Problem List   Diagnosis Date Noted  . Constipation 08/26/2019  . Vitamin D deficiency   . Sleep apnea   . Depression   . Anxiety   . Asthma   . Disease of thyroid gland 12/28/2011    Guss Bunde, PT, DPT 01/17/2021, 7:45 PM  [facility] [address] [place], [place], [address] Phone: [phone]   Fax:  [phone]  Name: Dianelys Scinto MRN: [id number] Date of Birth: January 09, 1971

## 2021-01-18 ENCOUNTER — Encounter: Payer: Self-pay | Admitting: Orthopaedic Surgery

## 2021-01-19 ENCOUNTER — Encounter: Payer: 59 | Admitting: Physical Therapy

## 2021-01-20 ENCOUNTER — Ambulatory Visit: Payer: 59 | Admitting: Physical Therapy

## 2021-01-20 ENCOUNTER — Encounter: Payer: Self-pay | Admitting: Physical Therapy

## 2021-01-20 ENCOUNTER — Other Ambulatory Visit: Payer: Self-pay

## 2021-01-20 DIAGNOSIS — M25562 Pain in left knee: Secondary | ICD-10-CM | POA: Diagnosis not present

## 2021-01-20 DIAGNOSIS — M6281 Muscle weakness (generalized): Secondary | ICD-10-CM

## 2021-01-20 DIAGNOSIS — R296 Repeated falls: Secondary | ICD-10-CM

## 2021-01-20 DIAGNOSIS — G8929 Other chronic pain: Secondary | ICD-10-CM

## 2021-01-20 DIAGNOSIS — R2689 Other abnormalities of gait and mobility: Secondary | ICD-10-CM

## 2021-01-20 NOTE — Therapy (Signed)
[facility] [address] [place], [place], [address] Phone: [phone]   Fax:  [phone]  Physical Therapy Treatment  Patient Details  Name: Olivia Zamora MRN: [id number] Date of Birth: [birth year] Referring Provider (PT): Deliah Boston, FNP   Encounter Date: 01/20/2021   PT End of Session - 01/20/21 0955    Visit Number 4    Number of Visits 12    Date for PT Re-Evaluation 02/28/21    Authorization Type [employer]; Progress note every 10th visit    PT Start Time [phone]    PT Stop Time 1030    PT Time Calculation (min) 44 min    Activity Tolerance Patient tolerated treatment well    Behavior During Therapy [facility] for tasks assessed/performed           Past Medical History:  Diagnosis Date  . Anxiety    Failed therapy with Paxil, Lexparo, Prozac, Wellbutrin, Xanax, and Ativan  . Arthritis   . Asthma   . Cardiac disease    Angina  . Chronic back pain   . Constipation 08/26/2019  . Depression    Failed therapy with Paxil, Lexparo, Prozac, Wellbutrin, and Ativan  . Disease of thyroid gland 12/28/2011   multiple thyroid nodules in 2013 - [place] on 03/08/14 showed heterogeneous appearance of thyroid gland without focal nodule  . Eczema   . Generalized seizure (HCC)    stress related  . GERD (gastroesophageal reflux disease)   . History of DVT (deep vein thrombosis) 1988  . Hypertension   . IBS (irritable bowel syndrome)   . Internal derangement of left knee   . Lactose intolerance   . Migraine   . Sleep apnea    wears CPAP  . Vitamin D deficiency     Past Surgical History:  Procedure Laterality Date  . ABDOMINAL HYSTERECTOMY    . CESAREAN SECTION     x3  . COLONOSCOPY    . HEMANGIOMA EXCISION Right    shoulder  . MASS EXCISION Right    lump from palm of hand  . OOPHORECTOMY    . OTHER SURGICAL HISTORY Right    Repair of tib-fib fracture  . TUBAL LIGATION    . TUMOR EXCISION Right    Axilla - benign  .  VASCULAR SURGERY Right 1989   blood clot removed from neck    There were no vitals filed for this visit.   Subjective Assessment - 01/20/21 0943    Subjective COVID-19 screening performed upon arrival. Patient reports walking around [place] and [facility] yesterday. Used Volataren last night to sleep.    Pertinent History CVA about 2017 or 2018; asthma    Limitations Sitting;Lifting;Standing;Walking;House hold activities    How long can you sit comfortably? short periods comfortably    How long can you stand comfortably? short periods comfortably    How long can you walk comfortably? short periods comfortably    Diagnostic tests x-ray: joint effusion, athritis present, (-) for fractures    Patient Stated Goals move around better    Currently in Pain? Yes    Pain Score 7     Pain Location Knee    Pain Orientation Left    Pain Descriptors / Indicators Discomfort    Pain Type Chronic pain    Pain Onset More than a month ago    Pain Frequency Constant              [facility] PT Assessment - 01/20/21 0001  Assessment   Medical Diagnosis Chronic pain of left knee, frequent falls, history of CVA, Weakness of left lower extremity    Referring Provider (PT) Deliah Boston, FNP    Next MD Visit "6 months"    Prior Therapy no      Precautions   Precautions Fall      Restrictions   Weight Bearing Restrictions No                         [facility] Adult PT Treatment/Exercise - 01/20/21 0001      Knee/Hip Exercises: Aerobic   Nustep Level 3 x10 mins      Knee/Hip Exercises: Standing   Heel Raises Both;20 reps    Heel Raises Limitations B toe raise x20 reps    Knee Flexion Strengthening;Both;2 sets;10 reps;Limitations    Knee Flexion Limitations 2#    Hip Flexion Stengthening;Both;2 sets;10 reps;Knee bent;Limitations    Hip Flexion Limitations 2#    Hip Abduction Stengthening;Both;2 sets;10 reps;Knee straight    Abduction Limitations 2#    Forward Step Up Left;2  sets;10 reps;Hand Hold: 2;Step Height: 6"      Knee/Hip Exercises: Seated   Long Arc Quad Strengthening;Both;2 sets;10 reps    Long Arc Quad Weight 4 lbs.    Clamshell with TheraBand Green   x20 reps   Sit to Sand 2 sets;10 reps;without UE support      Knee/Hip Exercises: Supine   Bridges Both;2 sets;10 reps                       PT Long Term Goals - 01/10/21 1804      PT LONG TERM GOAL #1   Title Patient will be independent with HEP and its progression.    Time 6    Period Weeks    Status New      PT LONG TERM GOAL #2   Title Patient will perform modified 5x sit to stand with UE support in 15 seconds or less to decrease fall risk.    Time 6    Period Weeks    Status New      PT LONG TERM GOAL #3   Title Patient will demonstrate a 4+ point improvement on the Berg Balance Scale to decrease risk of falls.    Time 6    Period Weeks    Status New      PT LONG TERM GOAL #4   Title Patient will report ability to perform ADLs with left knee pain less than or equal to 4/10.    Time 6    Period Weeks    Status New      PT LONG TERM GOAL #5   Title Patient will demonstrate 115+ degrees of right knee flexion AROM to improve ability to perform functional tasks.    Time 6    Period Weeks    Status New                 Plan - 01/20/21 1046    Clinical Impression Statement Patient presented in clinic with reports of some discomfort of L knee upon arrival. Patient progressed through more functional and light strengthening therex. Patient educated with VCs to monitor fatigue levels due to neurological deficits from past stroke. No complaints of pain or fatigue reported during therex.    Personal Factors and Comorbidities Age;Comorbidity 3+    Comorbidities history of CVA, Asthma, anxiety, chronic back pain, GERD;  latex allergy    Examination-Activity Limitations Bathing;Locomotion Level;Transfers;Squat;Stairs;Stand;Sit;Bed Mobility    Examination-Participation  Restrictions Cleaning    Stability/Clinical Decision Making Stable/Uncomplicated    Rehab Potential Fair    PT Frequency 2x / week    PT Duration 6 weeks    PT Treatment/Interventions ADLs/Self Care Home Management;Cryotherapy;Electrical Stimulation;Moist Heat;Ultrasound;Gait training;Stair training;Functional mobility training;Therapeutic activities;Therapeutic exercise;Balance training;Neuromuscular re-education;Manual techniques;Passive range of motion;Patient/family education;Vasopneumatic Device    PT Next Visit Plan continue Nustep, LE strengthening, balance activities, modalities PRN for pain relief    PT Home Exercise Plan see patient education section    Consulted and Agree with Plan of Care Patient           Patient will benefit from skilled therapeutic intervention in order to improve the following deficits and impairments:  Abnormal gait,Decreased activity tolerance,Decreased balance,Decreased strength,Increased edema,Pain,Difficulty walking,Decreased range of motion  Visit Diagnosis: Chronic pain of left knee  Muscle weakness (generalized)  Other abnormalities of gait and mobility  Repeated falls     Problem List Patient Active Problem List   Diagnosis Date Noted  . Constipation 08/26/2019  . Vitamin D deficiency   . Sleep apnea   . Depression   . Anxiety   . Asthma   . Disease of thyroid gland 12/28/2011    Marvell Fuller, PTA 01/20/2021, 11:05 AM  [facility] [address] [place], [place], [address] Phone: [phone]   Fax:  [phone]  Name: Ruchama Kubicek MRN: [id number] Date of Birth: 1971-11-08

## 2021-01-25 ENCOUNTER — Ambulatory Visit: Payer: 59 | Admitting: Physical Therapy

## 2021-01-27 NOTE — Telephone Encounter (Signed)
Referral placed.

## 2021-02-01 ENCOUNTER — Ambulatory Visit: Payer: 59 | Admitting: Physical Therapy

## 2021-02-03 ENCOUNTER — Encounter: Payer: Self-pay | Admitting: Physical Therapy

## 2021-02-03 ENCOUNTER — Other Ambulatory Visit: Payer: Self-pay

## 2021-02-03 ENCOUNTER — Ambulatory Visit: Payer: 59 | Admitting: Physical Therapy

## 2021-02-03 DIAGNOSIS — M25562 Pain in left knee: Secondary | ICD-10-CM | POA: Diagnosis not present

## 2021-02-03 DIAGNOSIS — R2689 Other abnormalities of gait and mobility: Secondary | ICD-10-CM

## 2021-02-03 DIAGNOSIS — R296 Repeated falls: Secondary | ICD-10-CM

## 2021-02-03 DIAGNOSIS — G8929 Other chronic pain: Secondary | ICD-10-CM

## 2021-02-03 DIAGNOSIS — M6281 Muscle weakness (generalized): Secondary | ICD-10-CM

## 2021-02-03 NOTE — Therapy (Signed)
[facility] [address] [place], [place], [address] Phone: [phone]   Fax:  [phone]  Physical Therapy Treatment  Patient Details  Name: Olivia Zamora MRN: [id number] Date of Birth: 1971/03/29 Referring Provider (PT): Deliah Boston, FNP   Encounter Date: 02/03/2021   PT End of Session - 02/03/21 1038    Visit Number 5    Number of Visits 12    Date for PT Re-Evaluation 02/28/21    Authorization Type [employer]; Progress note every 10th visit    PT Start Time 0945    PT Stop Time 1030    PT Time Calculation (min) 45 min    Activity Tolerance Patient tolerated treatment well    Behavior During Therapy [facility] for tasks assessed/performed           Past Medical History:  Diagnosis Date  . Anxiety    Failed therapy with Paxil, Lexparo, Prozac, Wellbutrin, Xanax, and Ativan  . Arthritis   . Asthma   . Cardiac disease    Angina  . Chronic back pain   . Constipation 08/26/2019  . Depression    Failed therapy with Paxil, Lexparo, Prozac, Wellbutrin, and Ativan  . Disease of thyroid gland 12/28/2011   multiple thyroid nodules in 2013 - [place] on 03/08/14 showed heterogeneous appearance of thyroid gland without focal nodule  . Eczema   . Generalized seizure (HCC)    stress related  . GERD (gastroesophageal reflux disease)   . History of DVT (deep vein thrombosis) 1988  . Hypertension   . IBS (irritable bowel syndrome)   . Internal derangement of left knee   . Lactose intolerance   . Migraine   . Sleep apnea    wears CPAP  . Vitamin D deficiency     Past Surgical History:  Procedure Laterality Date  . ABDOMINAL HYSTERECTOMY    . CESAREAN SECTION     x3  . COLONOSCOPY    . HEMANGIOMA EXCISION Right    shoulder  . MASS EXCISION Right    lump from palm of hand  . OOPHORECTOMY    . OTHER SURGICAL HISTORY Right    Repair of tib-fib fracture  . TUBAL LIGATION    . TUMOR EXCISION Right    Axilla - benign  .  VASCULAR SURGERY Right 1989   blood clot removed from neck    There were no vitals filed for this visit.   Subjective Assessment - 02/03/21 0957    Subjective COVID-19 screening performed upon arrival. Patient reports that she has had many incidents over the past few weeks which has limited her PT visits. Has found a walking track that she likes walking at that four laps is a mile.    Pertinent History CVA about 2017 or 2018; asthma    Limitations Sitting;Lifting;Standing;Walking;House hold activities    How long can you sit comfortably? short periods comfortably    How long can you stand comfortably? short periods comfortably    How long can you walk comfortably? short periods comfortably    Diagnostic tests x-ray: joint effusion, athritis present, (-) for fractures    Patient Stated Goals move around better    Currently in Pain? Yes    Pain Score 4     Pain Location Knee    Pain Orientation Left    Pain Descriptors / Indicators Aching    Pain Type Chronic pain    Pain Onset More than a month ago    Pain Frequency Constant  [facility] PT Assessment - 02/03/21 0001      Assessment   Medical Diagnosis Chronic pain of left knee, frequent falls, history of CVA, Weakness of left lower extremity    Referring Provider (PT) Deliah Boston, FNP    Next MD Visit "6 months"    Prior Therapy no      Precautions   Precautions Fall      Restrictions   Weight Bearing Restrictions No                         [facility] Adult PT Treatment/Exercise - 02/03/21 0001      Knee/Hip Exercises: Aerobic   Nustep Level 3 x14 mins      Knee/Hip Exercises: Standing   Heel Raises Both;3 sets;10 reps    Heel Raises Limitations B toe raise x30 reps    Knee Flexion Strengthening;Both;3 sets;10 reps;Limitations    Knee Flexion Limitations 2#    Hip Flexion Stengthening;Both;3 sets;10 reps;Knee bent;Limitations    Hip Flexion Limitations 2#    Hip Abduction  Stengthening;Both;Knee straight;3 sets;10 reps    Abduction Limitations 2#    Lateral Step Up Both;3 sets;10 reps;Hand Hold: 2;Step Height: 6"    Forward Step Up Both;3 sets;10 reps;Hand Hold: 2;Step Height: 6"    Functional Squat 2 sets;10 reps      Knee/Hip Exercises: Seated   Long Arc Quad Strengthening;Both;2 sets;10 reps    Long Arc Quad Weight 4 lbs.                       PT Long Term Goals - 01/10/21 1804      PT LONG TERM GOAL #1   Title Patient will be independent with HEP and its progression.    Time 6    Period Weeks    Status New      PT LONG TERM GOAL #2   Title Patient will perform modified 5x sit to stand with UE support in 15 seconds or less to decrease fall risk.    Time 6    Period Weeks    Status New      PT LONG TERM GOAL #3   Title Patient will demonstrate a 4+ point improvement on the Berg Balance Scale to decrease risk of falls.    Time 6    Period Weeks    Status New      PT LONG TERM GOAL #4   Title Patient will report ability to perform ADLs with left knee pain less than or equal to 4/10.    Time 6    Period Weeks    Status New      PT LONG TERM GOAL #5   Title Patient will demonstrate 115+ degrees of right knee flexion AROM to improve ability to perform functional tasks.    Time 6    Period Weeks    Status New                 Plan - 02/03/21 1039    Clinical Impression Statement Patient presented in clinic with less overall L knee pain. Patient has indicated that she is no longer limping during gait as much as she was before. Patient progressed with functional exercises and reps today. No complaints of fatigue or pain or after end of treatment.    Personal Factors and Comorbidities Age;Comorbidity 3+    Comorbidities history of CVA, Asthma, anxiety, chronic back pain, GERD; latex allergy  Examination-Activity Limitations Bathing;Locomotion Level;Transfers;Squat;Stairs;Stand;Sit;Bed Mobility     Examination-Participation Restrictions Cleaning    Stability/Clinical Decision Making Stable/Uncomplicated    Rehab Potential Fair    PT Frequency 2x / week    PT Duration 6 weeks    PT Treatment/Interventions ADLs/Self Care Home Management;Cryotherapy;Electrical Stimulation;Moist Heat;Ultrasound;Gait training;Stair training;Functional mobility training;Therapeutic activities;Therapeutic exercise;Balance training;Neuromuscular re-education;Manual techniques;Passive range of motion;Patient/family education;Vasopneumatic Device    PT Next Visit Plan continue Nustep, LE strengthening, balance activities, modalities PRN for pain relief    PT Home Exercise Plan see patient education section    Consulted and Agree with Plan of Care Patient           Patient will benefit from skilled therapeutic intervention in order to improve the following deficits and impairments:  Abnormal gait,Decreased activity tolerance,Decreased balance,Decreased strength,Increased edema,Pain,Difficulty walking,Decreased range of motion  Visit Diagnosis: Chronic pain of left knee  Muscle weakness (generalized)  Other abnormalities of gait and mobility  Repeated falls     Problem List Patient Active Problem List   Diagnosis Date Noted  . Constipation 08/26/2019  . Vitamin D deficiency   . Sleep apnea   . Depression   . Anxiety   . Asthma   . Disease of thyroid gland 12/28/2011    Marvell Fuller, PTA 02/03/2021, 10:43 AM  [facility] [address] [place], [place], [address] Phone: [phone]   Fax:  [phone]  Name: Olivia Zamora MRN: [id number] Date of Birth: 06-03-1971

## 2021-02-09 ENCOUNTER — Ambulatory Visit: Payer: 59 | Admitting: Family Medicine

## 2021-02-10 ENCOUNTER — Ambulatory Visit: Payer: 59 | Admitting: Physical Therapy

## 2021-02-10 ENCOUNTER — Ambulatory Visit: Payer: 59 | Admitting: Family Medicine

## 2021-02-10 ENCOUNTER — Encounter: Payer: Self-pay | Admitting: Family Medicine

## 2021-02-11 ENCOUNTER — Encounter: Payer: Self-pay | Admitting: Family Medicine

## 2021-02-11 DIAGNOSIS — G8929 Other chronic pain: Secondary | ICD-10-CM

## 2021-02-11 DIAGNOSIS — R296 Repeated falls: Secondary | ICD-10-CM

## 2021-02-11 DIAGNOSIS — M25562 Pain in left knee: Secondary | ICD-10-CM

## 2021-02-11 DIAGNOSIS — R29898 Other symptoms and signs involving the musculoskeletal system: Secondary | ICD-10-CM

## 2021-02-13 ENCOUNTER — Telehealth: Payer: Self-pay | Admitting: Family Medicine

## 2021-02-13 NOTE — Telephone Encounter (Signed)
Encounter created to request medical records from [facility].

## 2021-02-13 NOTE — Addendum Note (Signed)
Addended by: Deliah Boston F on: 02/13/2021 12:54 PM   Modules accepted: Orders

## 2021-02-15 ENCOUNTER — Encounter: Payer: 59 | Admitting: Physical Therapy

## 2021-02-15 ENCOUNTER — Encounter: Payer: Self-pay | Admitting: Orthopaedic Surgery

## 2021-02-17 ENCOUNTER — Encounter: Payer: 59 | Admitting: Physical Therapy

## 2021-02-20 ENCOUNTER — Other Ambulatory Visit: Payer: Self-pay

## 2021-02-20 ENCOUNTER — Ambulatory Visit: Payer: 59 | Attending: Family Medicine | Admitting: Physical Therapy

## 2021-02-20 DIAGNOSIS — G8929 Other chronic pain: Secondary | ICD-10-CM | POA: Insufficient documentation

## 2021-02-20 DIAGNOSIS — R2689 Other abnormalities of gait and mobility: Secondary | ICD-10-CM | POA: Insufficient documentation

## 2021-02-20 DIAGNOSIS — M6281 Muscle weakness (generalized): Secondary | ICD-10-CM | POA: Insufficient documentation

## 2021-02-20 DIAGNOSIS — R296 Repeated falls: Secondary | ICD-10-CM | POA: Diagnosis present

## 2021-02-20 DIAGNOSIS — M25562 Pain in left knee: Secondary | ICD-10-CM | POA: Insufficient documentation

## 2021-02-20 NOTE — Therapy (Signed)
[facility] [address] [place], [place], [address] Phone: [phone]   Fax:  [phone]  Physical Therapy Treatment  Patient Details  Name: Olivia Zamora MRN: [id number] Date of Birth: 03/10/1971 Referring Provider (PT): Deliah Boston, FNP   Encounter Date: 02/20/2021   PT End of Session - 02/20/21 1042    Visit Number 6    Number of Visits 12    Date for PT Re-Evaluation 02/28/21    Authorization Type [employer]; Progress note every 10th visit    PT Start Time 1032    PT Stop Time 1115    PT Time Calculation (min) 43 min    Activity Tolerance Patient tolerated treatment well    Behavior During Therapy [facility] for tasks assessed/performed           Past Medical History:  Diagnosis Date  . Anxiety    Failed therapy with Paxil, Lexparo, Prozac, Wellbutrin, Xanax, and Ativan  . Arthritis   . Asthma   . Cardiac disease    Angina  . Chronic back pain   . Constipation 08/26/2019  . Depression    Failed therapy with Paxil, Lexparo, Prozac, Wellbutrin, and Ativan  . Disease of thyroid gland 12/28/2011   multiple thyroid nodules in 2013 - [place] on 03/08/14 showed heterogeneous appearance of thyroid gland without focal nodule  . Eczema   . Generalized seizure (HCC)    stress related  . GERD (gastroesophageal reflux disease)   . History of DVT (deep vein thrombosis) 1988  . Hypertension   . IBS (irritable bowel syndrome)   . Internal derangement of left knee   . Lactose intolerance   . Migraine   . Sleep apnea    wears CPAP  . Vitamin D deficiency     Past Surgical History:  Procedure Laterality Date  . ABDOMINAL HYSTERECTOMY    . CESAREAN SECTION     x3  . COLONOSCOPY    . HEMANGIOMA EXCISION Right    shoulder  . MASS EXCISION Right    lump from palm of hand  . OOPHORECTOMY    . OTHER SURGICAL HISTORY Right    Repair of tib-fib fracture  . TUBAL LIGATION    . TUMOR EXCISION Right    Axilla - benign  .  VASCULAR SURGERY Right 1989   blood clot removed from neck    There were no vitals filed for this visit.   Subjective Assessment - 02/20/21 1040    Subjective COVID-19 screening performed upon arrival. Patient reports falling in her driveway over a tire on 02/09/2021.  Reports blacking out and was diagnosed with syncope. Cannot be seen by PCP until mid April 2022. Had a concussion, whiplash.    Pertinent History CVA about 2017 or 2018; asthma    Limitations Sitting;Lifting;Standing;Walking;House hold activities    How long can you sit comfortably? short periods comfortably    How long can you stand comfortably? short periods comfortably    How long can you walk comfortably? short periods comfortably    Diagnostic tests x-ray: joint effusion, athritis present, (-) for fractures    Patient Stated Goals move around better    Currently in Pain? Yes    Pain Score 7     Pain Location Generalized    Pain Descriptors / Indicators Discomfort    Pain Type Chronic pain    Pain Onset More than a month ago    Pain Frequency Constant  [facility] PT Assessment - 02/20/21 0001      Assessment   Medical Diagnosis Chronic pain of left knee, frequent falls, history of CVA, Weakness of left lower extremity    Referring Provider (PT) Deliah Boston, FNP    Next MD Visit 03/2021    Prior Therapy no      Precautions   Precautions Fall                         [facility] Adult PT Treatment/Exercise - 02/20/21 0001      Knee/Hip Exercises: Aerobic   Nustep L3 x11 min      Knee/Hip Exercises: Standing   Heel Raises Both;3 sets;10 reps    Heel Raises Limitations B toe raise x30 reps    Hip Flexion AROM;Both;2 sets;10 reps;Knee bent    Hip Abduction AROM;Both;2 sets;10 reps;Knee straight      Knee/Hip Exercises: Seated   Long Arc Quad Strengthening;Both;2 sets;10 reps    Long Arc Quad Weight 3 lbs.    Clamshell with TheraBand Red   3x10 reps   Hamstring Curl  Strengthening;Both;2 sets;10 reps    Hamstring Limitations red theraband    Sit to Sand 20 reps;with UE support                       PT Long Term Goals - 01/10/21 1804      PT LONG TERM GOAL #1   Title Patient will be independent with HEP and its progression.    Time 6    Period Weeks    Status New      PT LONG TERM GOAL #2   Title Patient will perform modified 5x sit to stand with UE support in 15 seconds or less to decrease fall risk.    Time 6    Period Weeks    Status New      PT LONG TERM GOAL #3   Title Patient will demonstrate a 4+ point improvement on the Berg Balance Scale to decrease risk of falls.    Time 6    Period Weeks    Status New      PT LONG TERM GOAL #4   Title Patient will report ability to perform ADLs with left knee pain less than or equal to 4/10.    Time 6    Period Weeks    Status New      PT LONG TERM GOAL #5   Title Patient will demonstrate 115+ degrees of right knee flexion AROM to improve ability to perform functional tasks.    Time 6    Period Weeks    Status New                 Plan - 02/20/21 1214    Clinical Impression Statement Patient presented in clinic after a short hiatus following another fall in which she states that she was hospitalized. Patient reports a concussion and whiplash and some aggravation of L knee pain which has continued to swell intermittantly. Slow therex session completed today with rest breaks utilized. Patient able to tolerate light therex well without complaint of faintness. Patient did report some fatigue following end of session. Patient encouraged to monitor symptoms until PCP visit.    Personal Factors and Comorbidities Age;Comorbidity 3+    Comorbidities history of CVA, Asthma, anxiety, chronic back pain, GERD; latex allergy    Examination-Activity Limitations Bathing;Locomotion Level;Transfers;Squat;Stairs;Stand;Sit;Bed Mobility    Examination-Participation Restrictions  Cleaning     Stability/Clinical Decision Making Stable/Uncomplicated    Rehab Potential Fair    PT Frequency 2x / week    PT Duration 6 weeks    PT Treatment/Interventions ADLs/Self Care Home Management;Cryotherapy;Electrical Stimulation;Moist Heat;Ultrasound;Gait training;Stair training;Functional mobility training;Therapeutic activities;Therapeutic exercise;Balance training;Neuromuscular re-education;Manual techniques;Passive range of motion;Patient/family education;Vasopneumatic Device    PT Next Visit Plan continue Nustep, LE strengthening, balance activities, modalities PRN for pain relief    PT Home Exercise Plan see patient education section    Consulted and Agree with Plan of Care Patient           Patient will benefit from skilled therapeutic intervention in order to improve the following deficits and impairments:  Abnormal gait,Decreased activity tolerance,Decreased balance,Decreased strength,Increased edema,Pain,Difficulty walking,Decreased range of motion  Visit Diagnosis: Chronic pain of left knee  Muscle weakness (generalized)  Other abnormalities of gait and mobility  Repeated falls     Problem List Patient Active Problem List   Diagnosis Date Noted  . Constipation 08/26/2019  . Vitamin D deficiency   . Sleep apnea   . Depression   . Anxiety   . Asthma   . Disease of thyroid gland 12/28/2011    Marvell Fuller, PTA 02/20/2021, 12:17 PM  [facility] [address] [place], [place], [address] Phone: [phone]   Fax:  [phone]  Name: Olivia Zamora MRN: [id number] Date of Birth: 04-Dec-1971

## 2021-02-23 ENCOUNTER — Ambulatory Visit: Payer: 59 | Admitting: Orthopaedic Surgery

## 2021-02-23 ENCOUNTER — Encounter: Payer: Self-pay | Admitting: Orthopedic Surgery

## 2021-02-23 ENCOUNTER — Ambulatory Visit: Payer: 59 | Admitting: Orthopedic Surgery

## 2021-02-23 ENCOUNTER — Other Ambulatory Visit: Payer: Self-pay

## 2021-02-23 VITALS — BP 154/104 | HR 84 | Ht 66.0 in | Wt 195.0 lb

## 2021-02-23 DIAGNOSIS — R29898 Other symptoms and signs involving the musculoskeletal system: Secondary | ICD-10-CM

## 2021-02-23 DIAGNOSIS — M25562 Pain in left knee: Secondary | ICD-10-CM

## 2021-02-23 DIAGNOSIS — S83242A Other tear of medial meniscus, current injury, left knee, initial encounter: Secondary | ICD-10-CM

## 2021-02-23 DIAGNOSIS — G8929 Other chronic pain: Secondary | ICD-10-CM

## 2021-02-23 NOTE — Progress Notes (Signed)
Chief Complaint  Patient presents with  . Knee Pain    Left Knee pain / also has quad atrophy s/p CVA 2017  . Fall    Has had multiple fall episodes, has started physical therapy in [place] for strengthening   50 year old female had a stroke 6 years ago developed atrophy and weakness in her left lower extremity however she says that in the last few months she has noticed that she is falling more frequently the leg is weaker and she has an inability to feel her feet on the ground indicating possible neuropathy  She has had x-rays of the knee in January then again in March with readings that said there was no fracture there was no effusion and no arthritis however my review of those images do reveal some degenerative changes  She is having some left knee pain and intermittent swelling  She has some intermittent lower back pain as well  Past Medical History:  Diagnosis Date  . Anxiety    Failed therapy with Paxil, Lexparo, Prozac, Wellbutrin, Xanax, and Ativan  . Arthritis   . Asthma   . Cardiac disease    Angina  . Chronic back pain   . Constipation 08/26/2019  . Depression    Failed therapy with Paxil, Lexparo, Prozac, Wellbutrin, and Ativan  . Disease of thyroid gland 12/28/2011   multiple thyroid nodules in 2013 - [place] on 03/08/14 showed heterogeneous appearance of thyroid gland without focal nodule  . Eczema   . Generalized seizure (HCC)    stress related  . GERD (gastroesophageal reflux disease)   . History of DVT (deep vein thrombosis) 1988  . Hypertension   . IBS (irritable bowel syndrome)   . Internal derangement of left knee   . Lactose intolerance   . Migraine   . Sleep apnea    wears CPAP  . Vitamin D deficiency    Past Surgical History:  Procedure Laterality Date  . ABDOMINAL HYSTERECTOMY    . CESAREAN SECTION     x3  . COLONOSCOPY    . HEMANGIOMA EXCISION Right    shoulder  . MASS EXCISION Right    lump from palm of hand  . OOPHORECTOMY    . OTHER SURGICAL  HISTORY Right    Repair of tib-fib fracture  . TUBAL LIGATION    . TUMOR EXCISION Right    Axilla - benign  . VASCULAR SURGERY Right 1989   blood clot removed from neck   Social History   Tobacco Use  . Smoking status: Never Smoker  . Smokeless tobacco: Never Used  Substance Use Topics  . Alcohol use: Never  . Drug use: Never    BP (!) 154/104   Pulse 84   Ht 5\' 6"  (1.676 m)   Wt 195 lb (88.5 kg)   BMI 31.47 kg/m   Physical Exam Constitutional:      General: She is not in acute distress.    Appearance: She is well-developed.     Comments: Well developed, well nourished Normal grooming and hygiene     Cardiovascular:     Comments: No peripheral edema Musculoskeletal:     Comments: Left knee  Tenderness minimal effusion none instability none muscle tone and strength weakness in extension compared to the right range of motion still normal  Meniscal signs seems to have a positive McMurray sign recommend MRI  Skin:    General: Skin is warm and dry.  Neurological:     Mental Status: She  is alert and oriented to person, place, and time.     Sensory: No sensory deficit.     Coordination: Coordination normal.     Gait: Gait abnormal.     Deep Tendon Reflexes: Reflexes are normal and symmetric.  Psychiatric:        Mood and Affect: Mood normal.        Behavior: Behavior normal.        Thought Content: Thought content normal.        Judgment: Judgment normal.     Comments: Affect normal    Independent interpretation of imaging Outside images taken on December 28, 2020 show asymmetric joint space medial lower than lateral osteophyte superior patella  Encounter Diagnoses  Name Primary?  . Left leg weakness Yes  . Chronic pain of left knee   . Acute medial meniscus tear of left knee, initial encounter     December 28, 2020 outside images of the left knee AP and lateral was read as negative however there is subtle joint space narrowing medially there are some subtle  changes of arthritis the medial compartment and patellofemoral compartment on this lateral x-ray there is a osteophyte superiorly indicating some arthritis probably not enough to explain the falls that the patient is having   Patient will have MRI of the left knee to rule out intra-articular pathology specifically medial meniscus tear ligament tear  Chronic problem exacerbation No medications Outside films were read independently

## 2021-02-23 NOTE — Patient Instructions (Addendum)
Recommendations  #1 talk to primary care about getting a neurology follow-up, most likely this is a neurologically related problem  #2 I recommend MRI of the left knee to rule out any obvious surgical pathology  #3 continue your therapy to strengthen your left leg    While we are working on your approval please go ahead and call to schedule your appointment with [facility] in at least one (1) week.   Central Scheduling [phone]

## 2021-02-24 ENCOUNTER — Encounter: Payer: Self-pay | Admitting: Family Medicine

## 2021-02-24 ENCOUNTER — Encounter: Payer: Self-pay | Admitting: Physical Therapy

## 2021-02-24 ENCOUNTER — Other Ambulatory Visit: Payer: Self-pay

## 2021-02-24 ENCOUNTER — Ambulatory Visit: Payer: 59 | Admitting: Physical Therapy

## 2021-02-24 DIAGNOSIS — M25562 Pain in left knee: Secondary | ICD-10-CM | POA: Diagnosis not present

## 2021-02-24 DIAGNOSIS — R296 Repeated falls: Secondary | ICD-10-CM

## 2021-02-24 DIAGNOSIS — R2689 Other abnormalities of gait and mobility: Secondary | ICD-10-CM

## 2021-02-24 DIAGNOSIS — G8929 Other chronic pain: Secondary | ICD-10-CM

## 2021-02-24 DIAGNOSIS — M6281 Muscle weakness (generalized): Secondary | ICD-10-CM

## 2021-02-24 NOTE — Addendum Note (Signed)
Addended by: Guss Bunde on: 02/24/2021 12:55 PM   Modules accepted: Orders

## 2021-02-24 NOTE — Therapy (Addendum)
[facility] [address] [place], [place], [address] Phone: [phone]   Fax:  [phone]  Physical Therapy Treatment  Patient Details  Name: Olivia Zamora MRN: [id number] Date of Birth: 1971-12-04 Referring Provider (PT): Deliah Boston, FNP   Encounter Date: 02/24/2021   PT End of Session - 02/24/21 1034    Visit Number 7    Number of Visits 12    Date for PT Re-Evaluation 03/31/21    Authorization Type [employer]; Progress note every 10th visit    PT Start Time 1031    PT Stop Time 1116    PT Time Calculation (min) 45 min    Activity Tolerance Patient tolerated treatment well    Behavior During Therapy [facility] for tasks assessed/performed           Past Medical History:  Diagnosis Date  . Anxiety    Failed therapy with Paxil, Lexparo, Prozac, Wellbutrin, Xanax, and Ativan  . Arthritis   . Asthma   . Cardiac disease    Angina  . Chronic back pain   . Constipation 08/26/2019  . Depression    Failed therapy with Paxil, Lexparo, Prozac, Wellbutrin, and Ativan  . Disease of thyroid gland 12/28/2011   multiple thyroid nodules in 2013 - [place] on 03/08/14 showed heterogeneous appearance of thyroid gland without focal nodule  . Eczema   . Generalized seizure (HCC)    stress related  . GERD (gastroesophageal reflux disease)   . History of DVT (deep vein thrombosis) 1988  . Hypertension   . IBS (irritable bowel syndrome)   . Internal derangement of left knee   . Lactose intolerance   . Migraine   . Sleep apnea    wears CPAP  . Vitamin D deficiency     Past Surgical History:  Procedure Laterality Date  . ABDOMINAL HYSTERECTOMY    . CESAREAN SECTION     x3  . COLONOSCOPY    . HEMANGIOMA EXCISION Right    shoulder  . MASS EXCISION Right    lump from palm of hand  . OOPHORECTOMY    . OTHER SURGICAL HISTORY Right    Repair of tib-fib fracture  . TUBAL LIGATION    . TUMOR EXCISION Right    Axilla - benign  .  VASCULAR SURGERY Right 1989   blood clot removed from neck    There were no vitals filed for this visit.   Subjective Assessment - 02/24/21 1032    Subjective COVID-19 screening performed upon arrival. Patient saw orthopedist and has an MRI scheduled next week to determine bracing or surgery.    Pertinent History CVA about 2017 or 2018; asthma    Limitations Sitting;Lifting;Standing;Walking;House hold activities    How long can you sit comfortably? short periods comfortably    How long can you stand comfortably? short periods comfortably    How long can you walk comfortably? short periods comfortably    Diagnostic tests x-ray: joint effusion, athritis present, (-) for fractures    Patient Stated Goals move around better    Currently in Pain? Yes    Pain Score 6     Pain Location Knee    Pain Orientation Left    Pain Descriptors / Indicators Discomfort    Pain Type Chronic pain    Pain Onset More than a month ago    Pain Frequency Constant              [facility] PT Assessment - 02/24/21 0001  Assessment   Medical Diagnosis Chronic pain of left knee, frequent falls, history of CVA, Weakness of left lower extremity    Referring Provider (PT) Deliah Boston, FNP    Next MD Visit 03/13/2021    Prior Therapy no      Precautions   Precautions Fall      Restrictions   Weight Bearing Restrictions No                         [facility] Adult PT Treatment/Exercise - 02/24/21 0001      Knee/Hip Exercises: Aerobic   Nustep L3 x16 min      Knee/Hip Exercises: Machines for Strengthening   Cybex Knee Extension 10# 2x10 reps    Cybex Knee Flexion 20# 3x10 reps    Cybex Leg Press 2 pl, seat 7 x20 reps      Knee/Hip Exercises: Standing   Hip Flexion Stengthening;Both;3 sets;10 reps;Knee bent    Hip Flexion Limitations 3#    Hip Abduction Stengthening;Both;3 sets;10 reps;Knee straight;Limitations    Abduction Limitations 3#    Lateral Step Up Both;3 sets;10 reps;Hand  Hold: 2;Step Height: 6"    Forward Step Up Both;3 sets;10 reps;Hand Hold: 2;Step Height: 6"                       PT Long Term Goals - 01/10/21 1804      PT LONG TERM GOAL #1   Title Patient will be independent with HEP and its progression.    Time 6    Period Weeks    Status New      PT LONG TERM GOAL #2   Title Patient will perform modified 5x sit to stand with UE support in 15 seconds or less to decrease fall risk.    Time 6    Period Weeks    Status New      PT LONG TERM GOAL #3   Title Patient will demonstrate a 4+ point improvement on the Berg Balance Scale to decrease risk of falls.    Time 6    Period Weeks    Status New      PT LONG TERM GOAL #4   Title Patient will report ability to perform ADLs with left knee pain less than or equal to 4/10.    Time 6    Period Weeks    Status New      PT LONG TERM GOAL #5   Title Patient will demonstrate 115+ degrees of right knee flexion AROM to improve ability to perform functional tasks.    Time 6    Period Weeks    Status New                 Plan - 02/24/21 1205    Clinical Impression Statement Patient presented in clinic with reports of overall improvement and feeling more confident. Patient was excited to return to machine strengthening that she had used during her gym training in the past. Patient able to tolerate progression with standing exercises well although requires correction due to confusion.    Personal Factors and Comorbidities Age;Comorbidity 3+    Comorbidities history of CVA, Asthma, anxiety, chronic back pain, GERD; latex allergy    Examination-Activity Limitations Bathing;Locomotion Level;Transfers;Squat;Stairs;Stand;Sit;Bed Mobility    Examination-Participation Restrictions Cleaning    Stability/Clinical Decision Making Stable/Uncomplicated    Rehab Potential Fair    PT Frequency 2x / week    PT Duration 6  weeks    PT Treatment/Interventions ADLs/Self Care Home  Management;Cryotherapy;Electrical Stimulation;Moist Heat;Ultrasound;Gait training;Stair training;Functional mobility training;Therapeutic activities;Therapeutic exercise;Balance training;Neuromuscular re-education;Manual techniques;Passive range of motion;Patient/family education;Vasopneumatic Device    PT Next Visit Plan continue Nustep, LE strengthening, balance activities, modalities PRN for pain relief    PT Home Exercise Plan see patient education section    Consulted and Agree with Plan of Care Patient           Patient will benefit from skilled therapeutic intervention in order to improve the following deficits and impairments:  Abnormal gait,Decreased activity tolerance,Decreased balance,Decreased strength,Increased edema,Pain,Difficulty walking,Decreased range of motion  Visit Diagnosis: Chronic pain of left knee  Muscle weakness (generalized)  Other abnormalities of gait and mobility  Repeated falls     Problem List Patient Active Problem List   Diagnosis Date Noted  . Constipation 08/26/2019  . Vitamin D deficiency   . Sleep apnea   . Depression   . Anxiety   . Asthma   . Disease of thyroid gland 12/28/2011    Marvell Fuller, PTA 02/24/2021, 12:54 PM  [facility] [address] [place], [place], [address] Phone: [phone]   Fax:  [phone]  Name: Olivia Zamora MRN: [id number] Date of Birth: September 30, 1971

## 2021-02-27 ENCOUNTER — Encounter: Payer: Self-pay | Admitting: Family Medicine

## 2021-03-05 ENCOUNTER — Other Ambulatory Visit: Payer: Self-pay | Admitting: Family Medicine

## 2021-03-05 DIAGNOSIS — F332 Major depressive disorder, recurrent severe without psychotic features: Secondary | ICD-10-CM

## 2021-03-05 DIAGNOSIS — F419 Anxiety disorder, unspecified: Secondary | ICD-10-CM

## 2021-03-09 ENCOUNTER — Other Ambulatory Visit: Payer: Self-pay

## 2021-03-09 ENCOUNTER — Ambulatory Visit (HOSPITAL_COMMUNITY)
Admission: RE | Admit: 2021-03-09 | Discharge: 2021-03-09 | Disposition: A | Discharge status: Home / Self Care | Payer: 59 | Source: Ambulatory Visit | Attending: Orthopedic Surgery | Admitting: Orthopedic Surgery

## 2021-03-09 DIAGNOSIS — M25562 Pain in left knee: Secondary | ICD-10-CM | POA: Diagnosis present

## 2021-03-09 DIAGNOSIS — R29898 Other symptoms and signs involving the musculoskeletal system: Secondary | ICD-10-CM | POA: Insufficient documentation

## 2021-03-09 DIAGNOSIS — S83242A Other tear of medial meniscus, current injury, left knee, initial encounter: Secondary | ICD-10-CM | POA: Insufficient documentation

## 2021-03-09 DIAGNOSIS — G8929 Other chronic pain: Secondary | ICD-10-CM | POA: Insufficient documentation

## 2021-03-09 IMAGING — MR MR KNEE*L* W/O CM
7 of 11 series · 24 of 40 positions shown · non-contrast
Comparison: None.

CLINICAL DATA: Left knee pain for 6 months.  No known injury.

EXAM:
MRI OF THE LEFT KNEE WITHOUT CONTRAST
TECHNIQUE: Multiplanar, multisequence MR imaging of the knee was performed. No
intravenous contrast was administered.

[Series 8: T2 fat-sat · axial · left · 4.0mm · 0.47mm/px · z∈[-17,+108]mm · 4 of 26 slices shown (1 of 3)]
[im 1/26]
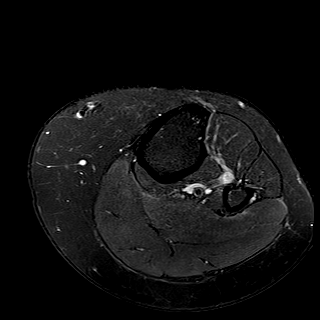
[im 9/26]
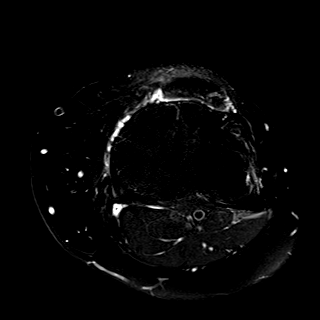
[im 17/26]
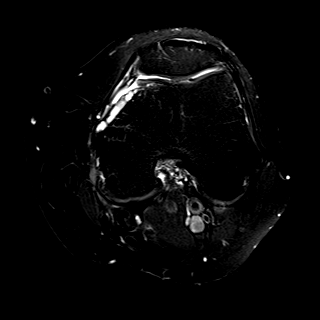
[im 26/26]
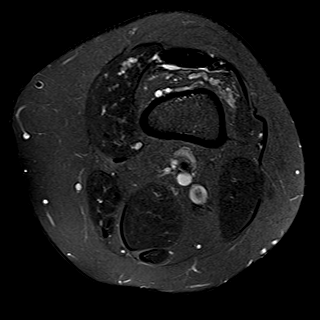

[Series 9: T1 · coronal · left · 4.0mm · 0.59mm/px · 3 of 24 slices shown]
[im 1/24]
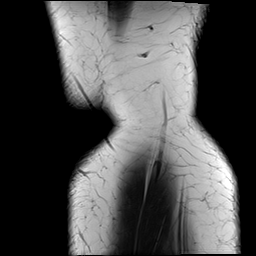
[im 12/24]
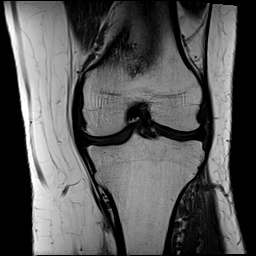
[im 24/24]
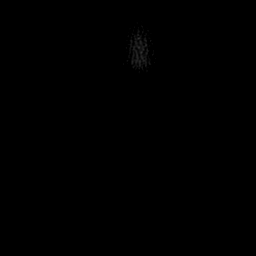

[Series 10: T2 fat-sat · coronal · left · 4.0mm · 0.59mm/px · 4 of 28 slices shown (2 of 3)]
[im 1/28]
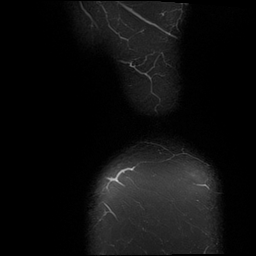
[im 10/28]
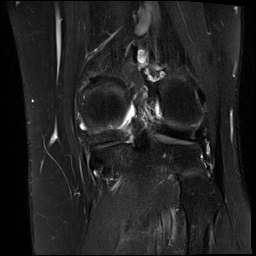
[im 19/28]
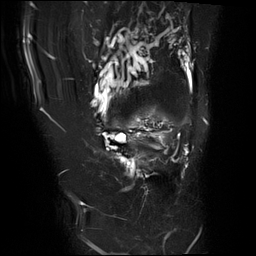
[im 28/28]
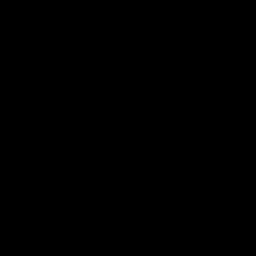

[Series 11: PD fat-sat · coronal · left · 4.0mm · 0.59mm/px · 4 of 28 slices shown (1 of 2)]
[im 1/28]
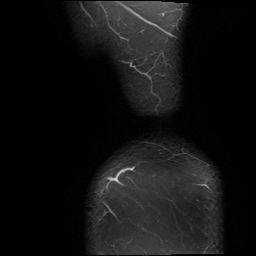
[im 10/28]
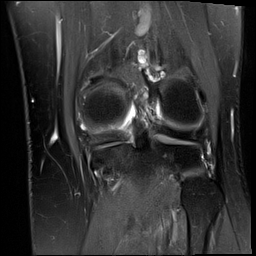
[im 19/28]
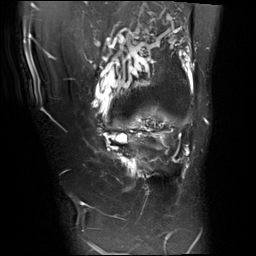
[im 28/28]
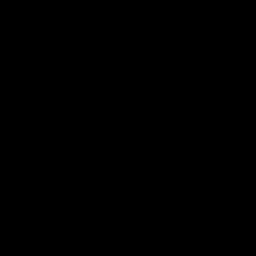

[Series 12: PD fat-sat · sagittal · left · 3.0mm · 0.59mm/px · 4 of 28 slices shown (2 of 2)]
[im 1/28]
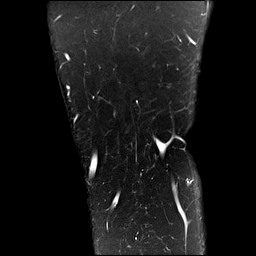
[im 10/28]
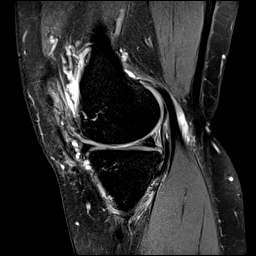
[im 19/28]
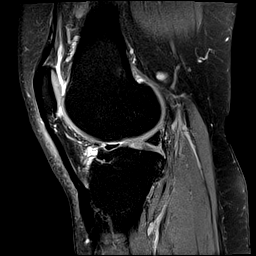
[im 28/28]
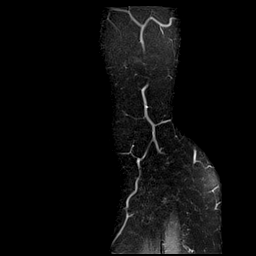

[Series 13: T2 fat-sat · sagittal · left · 3.0mm · 0.59mm/px · 4 of 28 slices shown (3 of 3)]
[im 1/28]
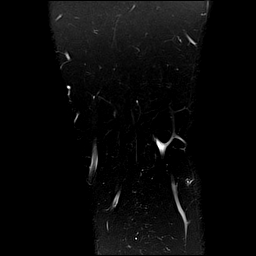
[im 10/28]
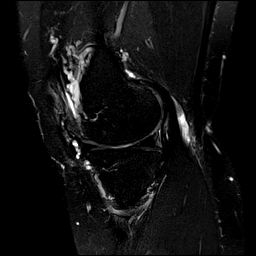
[im 19/28]
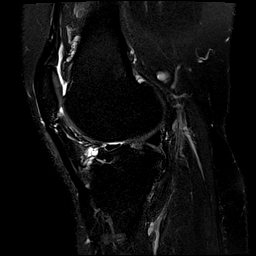
[im 28/28]
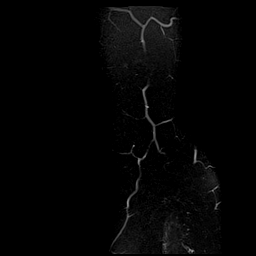

[Series 14: PD · oblique · left · 2.0mm · 0.47mm/px · 1 of 10 slices shown]
[im 1/10]
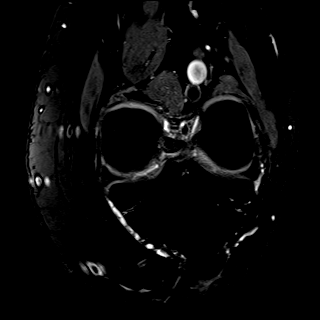

[24 of 40 positions shown; findings below may reference images not displayed]

FINDINGS: Plain films left knee [DATE].

MENISCI

Medial meniscus: There is blunting of the root of the posterior horn
of the medial meniscus worrisome for a radial tear.

Lateral meniscus:  Intact.

LIGAMENTS

Cruciates:  Intact.

Collaterals:  Intact

CARTILAGE

Patellofemoral:  Mildly degenerated without focal defect.

Medial:  Mildly degenerated without focal defect.

Lateral:  Mildly degenerated without focal defect.

Joint: Trace joint effusion. The patient has multiple prominent
vessels throughout the joint, most conspicuous anteriorly consistent
with a vascular malformation. Exact nature of the formation cannot
be assessed on the study but the appearance is most suggestive of a
predominantly venous malformation.

Popliteal Fossa:  Small Baker's cyst.

Extensor Mechanism:  Intact.

Bones: No fracture, stress change or worrisome lesion. Small
osteophytes about the knee noted.

Other: None
IMPRESSION: Large appearing vascular malformation about the knee cannot be
definitively characterized on the study but could be a source of
knee pain. Recommend consultation with interventional radiology. CT
angiogram of the knee could also be performed for further
evaluation.

Blunting at the root of the posterior horn of the medial meniscus
worrisome for a radial tear.

Mild appearing osteoarthritis about the knee.

## 2021-03-13 ENCOUNTER — Ambulatory Visit: Payer: 59 | Admitting: Orthopedic Surgery

## 2021-03-13 ENCOUNTER — Other Ambulatory Visit: Payer: Self-pay

## 2021-03-13 ENCOUNTER — Telehealth: Payer: Self-pay | Admitting: Orthopedic Surgery

## 2021-03-13 ENCOUNTER — Encounter: Payer: Self-pay | Admitting: Orthopedic Surgery

## 2021-03-13 VITALS — BP 152/104 | HR 106 | Ht 66.0 in | Wt 215.0 lb

## 2021-03-13 DIAGNOSIS — M25562 Pain in left knee: Secondary | ICD-10-CM | POA: Diagnosis not present

## 2021-03-13 DIAGNOSIS — G8929 Other chronic pain: Secondary | ICD-10-CM

## 2021-03-13 DIAGNOSIS — Q2730 Arteriovenous malformation, site unspecified: Secondary | ICD-10-CM

## 2021-03-13 DIAGNOSIS — M67262 Synovial hypertrophy, not elsewhere classified, left lower leg: Secondary | ICD-10-CM

## 2021-03-13 NOTE — Patient Instructions (Signed)
Call Central scheduling at [facility] for the CT Angiogram lower extremity [phone]

## 2021-03-13 NOTE — Telephone Encounter (Signed)
Ok, Radiology will have to help out with this   If they cant I m sending her to [facility] for the whole thing

## 2021-03-13 NOTE — Progress Notes (Signed)
Chief Complaint  Patient presents with  . Results    Review MRI knee left      Chief Complaint Patient presents with . Knee Pain     Left Knee pain / also has quad atrophy s/p CVA 2017 . Fall     Has had multiple fall episodes, has started physical therapy in [place] for strengthening   50 year old female had a stroke 6 years ago developed atrophy and weakness in her left lower extremity however she says that in the last few months she has noticed that she is falling more frequently the leg is weaker and she has an inability to feel her feet on the ground indicating possible neuropathy   She has had x-rays of the knee in January then again in March with readings that said there was no fracture there was no effusion and no arthritis however my review of those images do reveal some degenerative changes    50 year old female tells me that her son was just recently diagnosed with an AV malformation in his heart  She has other symptoms of back pain history of sciatica she says the legs go numb  I am not sure how much this is related to her potential AVM in the knee, this may be a separate lumbar spine spinal stenosis issue  MRI REPORT:  Joint: Trace joint effusion. The patient has multiple prominent vessels throughout the joint, most conspicuous anteriorly consistent with a vascular malformation. Exact nature of the formation cannot be assessed on the study but the appearance is most suggestive of a predominantly venous malformation.   Popliteal Fossa:  Small Baker's cyst.   Extensor Mechanism:  Intact.   Bones: No fracture, stress change or worrisome lesion. Small osteophytes about the knee noted.   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 Recommend consultation with interventional radiology. CT angiogram of the knee could also be performed for  further Evaluation.  I looked at the MRI of not comfortable with the pathology but will go ahead and follow radiology recommendations for CT angiogram  She will need a CT angiogram and then referral to a tertiary care center   Encounter Diagnoses  Name Primary?  . Chronic pain of left knee   . Left leg claudication (HCC)   . Arteriovenous malformation (AVM) Yes    Follow-up after AVM

## 2021-03-13 NOTE — Telephone Encounter (Signed)
Patient called back.  At first she said she was allergic to iodine.  She then said she is allergic to Prednisone and Benadryl.  She said that "the x-ray tech" told her that she needed to let us know of these allergies and there is another medication she can take before having the MRI.  She couldn't remember what the other medication was   Would you call her?    thanks

## 2021-03-13 NOTE — Telephone Encounter (Signed)
50mg  Prednisone  PO- 13, 7, 1 hour before test  50mg   Benadryl PO-  within 1 before test  Is protocol for allergy to Contrast media pre test pended so we can send in to pharmacy for her,

## 2021-03-13 NOTE — Addendum Note (Signed)
Addended byCaffie Damme on: 03/13/2021 03:17 PM   Modules accepted: Orders

## 2021-03-14 ENCOUNTER — Encounter: Payer: Self-pay | Admitting: Family Medicine

## 2021-03-14 NOTE — Telephone Encounter (Signed)
When I m at the office we can call doctor in radiology and ask what they do in this case

## 2021-03-14 NOTE — Telephone Encounter (Signed)
Radiology told me to discuss with you allergy to prednisone and benadryl, which she did not previously list as allergies  Ok to refer to [facility]?

## 2021-03-14 NOTE — Telephone Encounter (Addendum)
I called the Radiologist number and spoke to the interventional Radiologist, he recommended a tertiary care center. We will refer to [facility] I will advise patient when she calls me back.

## 2021-03-14 NOTE — Telephone Encounter (Signed)
Left message for patient to call me back. She now states she has allergy to meds used for the allergy to contrast media  Need to discuss sending her to [facility] unsure how to proceed otherwise. Told her to call me back.

## 2021-03-15 ENCOUNTER — Encounter: Payer: Self-pay | Admitting: Family Medicine

## 2021-03-15 ENCOUNTER — Telehealth: Payer: Self-pay | Admitting: Orthopedic Surgery

## 2021-03-15 NOTE — Telephone Encounter (Signed)
Sent message in my chart. 

## 2021-03-15 NOTE — Telephone Encounter (Signed)
Patient called back; states she received message from Amy following her office visit today; said she has some confusion regarding medications and also about the referral to [facility]. States hopes she does not have to be referred to [facility], due to distance away from where she lives. Please advise.  (patient aware clinic staff and Dr Romeo Apple out of clinic remainder of today, 03/15/21)

## 2021-03-15 NOTE — Telephone Encounter (Signed)
Patient scheduled for April 12, 9:50am for televisit.

## 2021-03-16 ENCOUNTER — Ambulatory Visit: Payer: 59 | Admitting: Family Medicine

## 2021-03-16 ENCOUNTER — Encounter: Payer: Self-pay | Admitting: Family Medicine

## 2021-03-16 NOTE — Telephone Encounter (Signed)
I sent message to Dr Romeo Apple

## 2021-03-21 ENCOUNTER — Encounter: Payer: Self-pay | Admitting: Family Medicine

## 2021-03-21 ENCOUNTER — Ambulatory Visit (INDEPENDENT_AMBULATORY_CARE_PROVIDER_SITE_OTHER): Payer: 59 | Admitting: Family Medicine

## 2021-03-21 ENCOUNTER — Telehealth: Payer: Self-pay | Admitting: Orthopedic Surgery

## 2021-03-21 DIAGNOSIS — S83242D Other tear of medial meniscus, current injury, left knee, subsequent encounter: Secondary | ICD-10-CM | POA: Diagnosis not present

## 2021-03-21 DIAGNOSIS — Z888 Allergy status to other drugs, medicaments and biological substances status: Secondary | ICD-10-CM | POA: Diagnosis not present

## 2021-03-21 DIAGNOSIS — Q278 Other specified congenital malformations of peripheral vascular system: Secondary | ICD-10-CM | POA: Diagnosis not present

## 2021-03-21 DIAGNOSIS — N6489 Other specified disorders of breast: Secondary | ICD-10-CM

## 2021-03-21 MED ORDER — PREDNISONE 50 MG PO TABS: ORAL_TABLET | ORAL | 0 refills | Status: DC

## 2021-03-21 MED ORDER — DIPHENHYDRAMINE HCL 50 MG PO TABS: ORAL_TABLET | ORAL | 0 refills | Status: DC

## 2021-03-21 NOTE — Addendum Note (Signed)
Addended by: Deliah Boston F on: 03/21/2021 11:34 AM   Modules accepted: Orders

## 2021-03-21 NOTE — Progress Notes (Addendum)
Virtual Visit via Telephone Note  I connected with Olivia Zamora on 03/21/21 at 9:56 AM by telephone and verified that I am speaking with the correct person using two identifiers. Olivia Zamora is currently located at home and nobody is currently with her during this visit. The provider, Gwenlyn Fudge, FNP is located in their home at time of visit.  I discussed the limitations, risks, security and privacy concerns of performing an evaluation and management service by telephone and the availability of in person appointments. I also discussed with the patient that there may be a patient responsible charge related to this service. The patient expressed understanding and agreed to proceed.  Subjective: PCP: Gwenlyn Fudge, FNP  Chief Complaint  Patient presents with  . Vascular malformation   Patient saw the orthopedic due to left knee pain and left lower extremity muscular atrophy who ordered a MRI of the left knee.  The MRI revealed a large appearing vascular malformation about the knee.  It was recommended CT angiogram of the knee be performed for further evaluation.  Patient has an allergy to iodine.  When the test was ordered she was advised she would have to take Benadryl and prednisone due to her allergy to iodine.  She asked if there was an alternative as she is not crazy about how prednisone makes her feel.  Patient CT scan was canceled and she was referred to [facility].  She does not wish to go to [facility] and would like to stay within the [facility] network.  Additionally on the MRI patient have a meniscal tear.  She states she was told the vascular malformation would need to be taking care of first.  Patient would also like a referral to a plastic surgeon that specializes in breast reconstruction.  She reports her left breast is 3 times larger than her right breast and has been since she was 50 years of age.  It was recommended she have surgery at that age, but she did not do so as she was  scared.  She reports her breast is now causing pain in her shoulder and back.  She also feels it is increasing her migraines.  She tries to wear bras to support the larger breast but is unable to fit it into a DDD.  She has also tried "boob tape" that is also not effective.  She has to sleep with 7 pillows to support the breast.   ROS: Per HPI  Current Outpatient Medications:  .  albuterol (ACCUNEB) 0.63 MG/3ML nebulizer solution, Take 1 ampule by nebulization every 6 (six) hours as needed for wheezing., Disp: , Rfl:  .  aspirin EC 81 MG tablet, Take 81 mg by mouth daily., Disp: , Rfl:  .  b complex vitamins tablet, Take 1 tablet by mouth daily., Disp: , Rfl:  .  docusate sodium (COLACE) 100 MG capsule, Take 100 mg by mouth., Disp: , Rfl:  .  DULoxetine (CYMBALTA) 30 MG capsule, Take 1 capsule by mouth twice daily, Disp: 60 capsule, Rfl: 0 .  lactulose (CHRONULAC) 10 GM/15ML solution, Take 15 mLs (10 g total) by mouth daily as needed for mild constipation., Disp: 473 mL, Rfl: 1 .  Multiple Vitamins-Minerals (MULTIVITAMIN ADULT PO), Take 1 tablet by mouth daily., Disp: , Rfl:  .  polyethylene glycol (MIRALAX / GLYCOLAX) 17 g packet, Take 17 g by mouth., Disp: , Rfl:  .  Riboflavin (VITAMIN B-2 PO), Take 1 tablet by mouth daily., Disp: , Rfl:  .  SYNTHROID 100 MCG tablet, Take 1 tablet by mouth daily at 2 PM., Disp: , Rfl:  .  SYNTHROID 112 MCG tablet, Take 112 mcg by mouth daily., Disp: , Rfl:  .  temazepam (RESTORIL) 30 MG capsule, Take 1 capsule (30 mg total) by mouth at bedtime., Disp: 30 capsule, Rfl: 5 .  Vitamin D, Ergocalciferol, (DRISDOL) 1.25 MG (50000 UT) CAPS capsule, Take 50,000 Units by mouth every 7 (seven) days., Disp: , Rfl:   Allergies  Allergen Reactions  . Iodine Anaphylaxis  . Latex Anaphylaxis   Past Medical History:  Diagnosis Date  . Anxiety    Failed therapy with Paxil, Lexparo, Prozac, Wellbutrin, Xanax, and Ativan  . Arthritis   . Asthma   . Cardiac disease     Angina  . Chronic back pain   . Constipation 08/26/2019  . Depression    Failed therapy with Paxil, Lexparo, Prozac, Wellbutrin, and Ativan  . Disease of thyroid gland 12/28/2011   multiple thyroid nodules in 2013 - [place] on 03/08/14 showed heterogeneous appearance of thyroid gland without focal nodule  . Eczema   . Generalized seizure (HCC)    stress related  . GERD (gastroesophageal reflux disease)   . History of DVT (deep vein thrombosis) 1988  . Hypertension   . IBS (irritable bowel syndrome)   . Internal derangement of left knee   . Lactose intolerance   . Migraine   . Sleep apnea    wears CPAP  . Vitamin D deficiency     Observations/Objective: A&O  No respiratory distress or wheezing audible over the phone Mood, judgement, and thought processes all WNL   Assessment and Plan: 1. Vascular malformation of lower extremity - Ambulatory referral to Vascular Surgery - CT ANGIO LOW EXTREM LEFT W &/OR WO CONTRAST; Future  2. Acute medial meniscal tear, left, subsequent encounter Will refer to orthopedics after the vascular malformation is addressed.  3. Asymmetrical breasts - Ambulatory referral to Plastic Surgery   ADDENDUM: medications sent to pharmacy due to contrast allergy Allergy to iodine - predniSONE (DELTASONE) 50 MG tablet; Take 50 mg (1 tablet) by mouth 13 hours before CT injection, then 7 hours before, and finally 1 hour before.  Dispense: 3 tablet; Refill: 0 - diphenhydrAMINE (BENADRYL) 50 MG tablet; Take 50 mg (1 tablet) by mouth 1 hour before CT injection.  Dispense: 1 tablet; Refill: 0   Follow Up Instructions:  I discussed the assessment and treatment plan with the patient. The patient was provided an opportunity to ask questions and all were answered. The patient agreed with the plan and demonstrated an understanding of the instructions.   The patient was advised to call back or seek an in-person evaluation if the symptoms worsen or if the condition fails  to improve as anticipated.  The above assessment and management plan was discussed with the patient. The patient verbalized understanding of and has agreed to the management plan. Patient is aware to call the clinic if symptoms persist or worsen. Patient is aware when to return to the clinic for a follow-up visit. Patient educated on when it is appropriate to go to the emergency department.   Time call ended: 10:31 AM  I provided 35 minutes of non-face-to-face time during this encounter.  Deliah Boston, MSN, APRN, FNP-C [facility] 03/21/21

## 2021-03-21 NOTE — Telephone Encounter (Signed)
Call received (On line now) from [employer] at [employer], patient's primary care provider's office asking for [facility]

## 2021-03-21 NOTE — Telephone Encounter (Signed)
Called to cancel order, no order found from our office.

## 2021-03-23 ENCOUNTER — Encounter: Payer: Self-pay | Admitting: Family Medicine

## 2021-03-27 ENCOUNTER — Encounter: Payer: Self-pay | Admitting: Family Medicine

## 2021-03-27 ENCOUNTER — Telehealth: Payer: Self-pay | Admitting: Family Medicine

## 2021-03-27 DIAGNOSIS — Q278 Other specified congenital malformations of peripheral vascular system: Secondary | ICD-10-CM

## 2021-03-27 NOTE — Telephone Encounter (Signed)
Urgent referral placed to interventional radiology due to vascular malformation as I was informed on the previous referral to vascular and vein that they were not the most appropriate referral.

## 2021-03-29 ENCOUNTER — Other Ambulatory Visit: Payer: Self-pay

## 2021-03-29 DIAGNOSIS — Q278 Other specified congenital malformations of peripheral vascular system: Secondary | ICD-10-CM

## 2021-03-29 NOTE — Progress Notes (Signed)
Per Victorino Dike in CT at [facility] both lower ext needed for comparison.

## 2021-03-30 ENCOUNTER — Other Ambulatory Visit: Payer: Self-pay

## 2021-03-30 ENCOUNTER — Ambulatory Visit (HOSPITAL_COMMUNITY)
Admission: RE | Admit: 2021-03-30 | Discharge: 2021-03-30 | Disposition: A | Discharge status: Home / Self Care | Payer: 59 | Source: Ambulatory Visit | Attending: Family Medicine | Admitting: Family Medicine

## 2021-03-30 ENCOUNTER — Telehealth: Payer: Self-pay

## 2021-03-30 ENCOUNTER — Encounter: Payer: Self-pay | Admitting: Family Medicine

## 2021-03-30 DIAGNOSIS — Q278 Other specified congenital malformations of peripheral vascular system: Secondary | ICD-10-CM | POA: Insufficient documentation

## 2021-03-30 DIAGNOSIS — Z888 Allergy status to other drugs, medicaments and biological substances status: Secondary | ICD-10-CM

## 2021-03-30 IMAGING — CT CT ANGIO EXTREM LOW*L*
2 of 5 series · 11 of 46 positions shown, 12 images · IV contrast (omnipaque)
Comparison: MR [DATE]
COMPARISON: MR [DATE]

Addendum:
CLINICAL DATA: Remote fall with left knee injury. AVM identified on
MRI.

EXAM:
CT ANGIOGRAPHY OF THE BILATERAL LOWER EXTREMITIES
TECHNIQUE: Multidetector CT imaging of the bilateral lower extremitieswas
performed using the standard protocol during bolus administration of
intravenous contrast. Multiplanar CT image reconstructions and MIPs
were obtained to evaluate the vascular anatomy.
CONTRAST:  100mL OMNIPAQUE IOHEXOL 350 MG/ML SOLN

[Series 10: axial arterial · axial · arterial · 0.93mm/px · z∈[-1521,-531]mm · 8 of 380 slices shown, 9 images]
[im 25/380  soft-tissue]
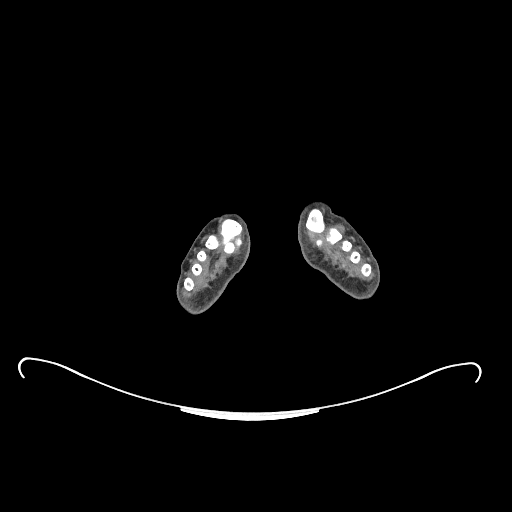
[im 25/380  bone]
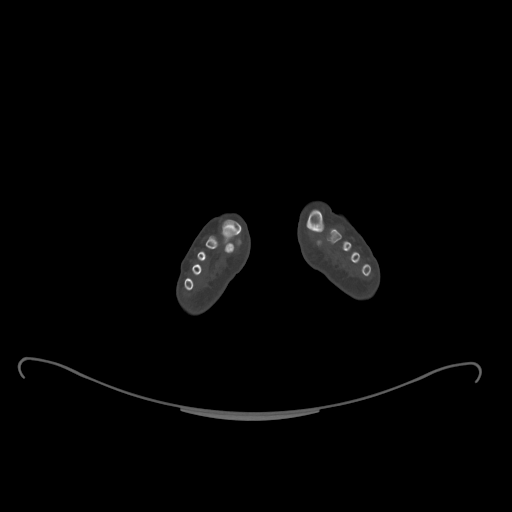
[im 74/380  soft-tissue]
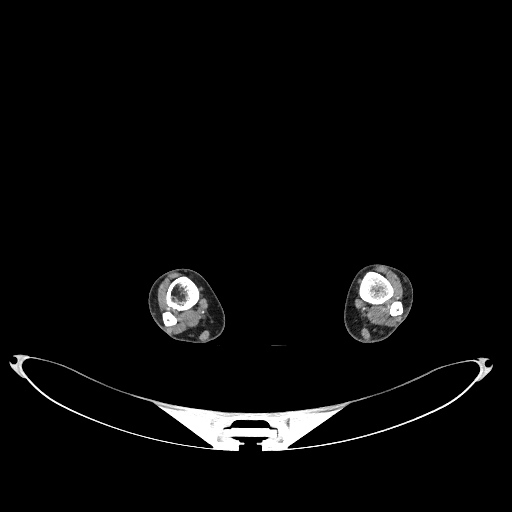
[im 123/380  soft-tissue]
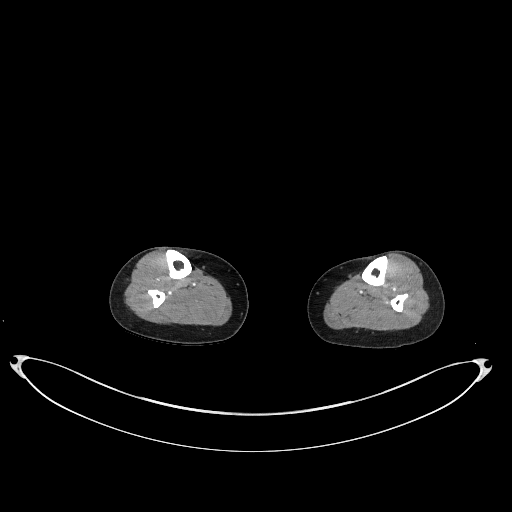
[im 172/380  soft-tissue]
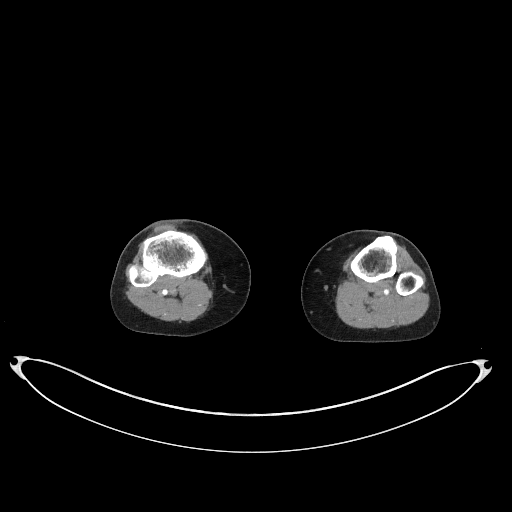
[im 208/380  soft-tissue]
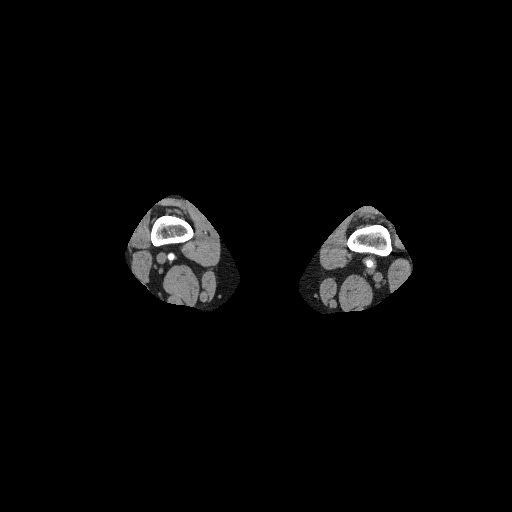
[im 257/380  soft-tissue]
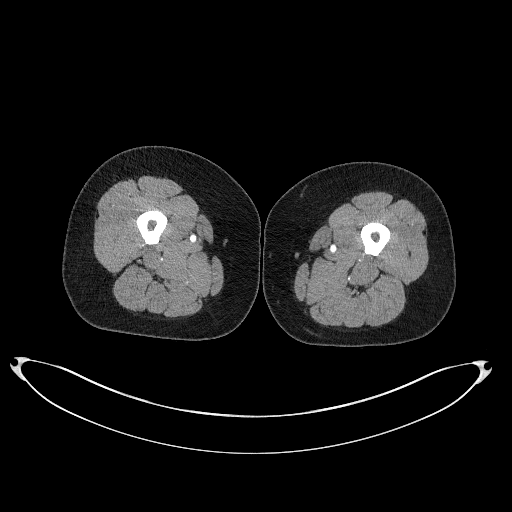
[im 306/380  soft-tissue]
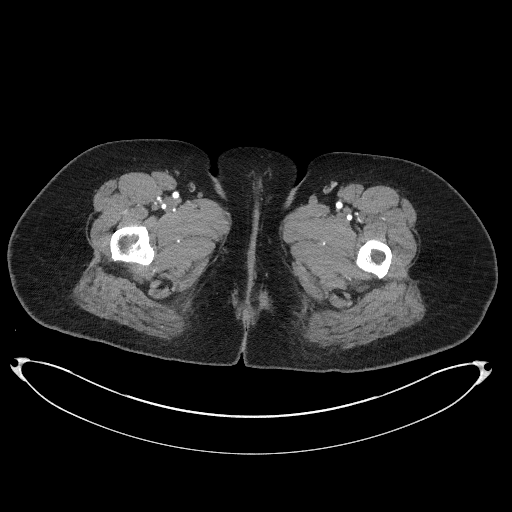
[im 355/380  soft-tissue]
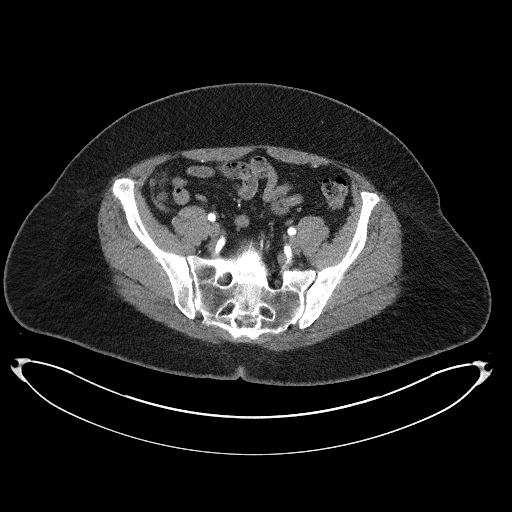

[Series 12: coronals · coronal · 0.91mm/px · 3 of 104 slices shown]
[im 26/104  soft-tissue]
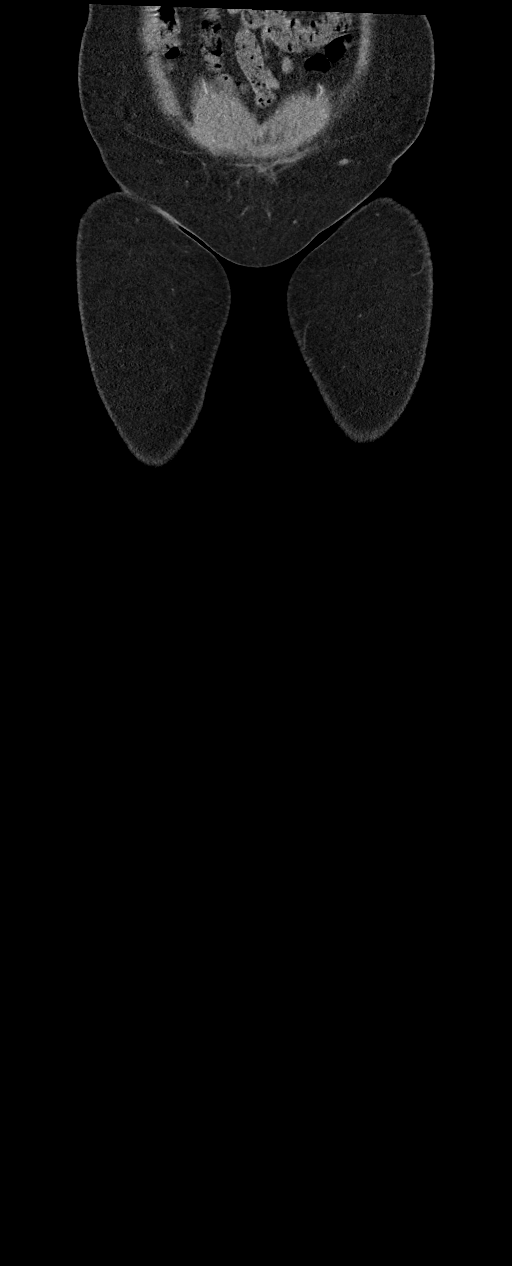
[im 52/104  soft-tissue]
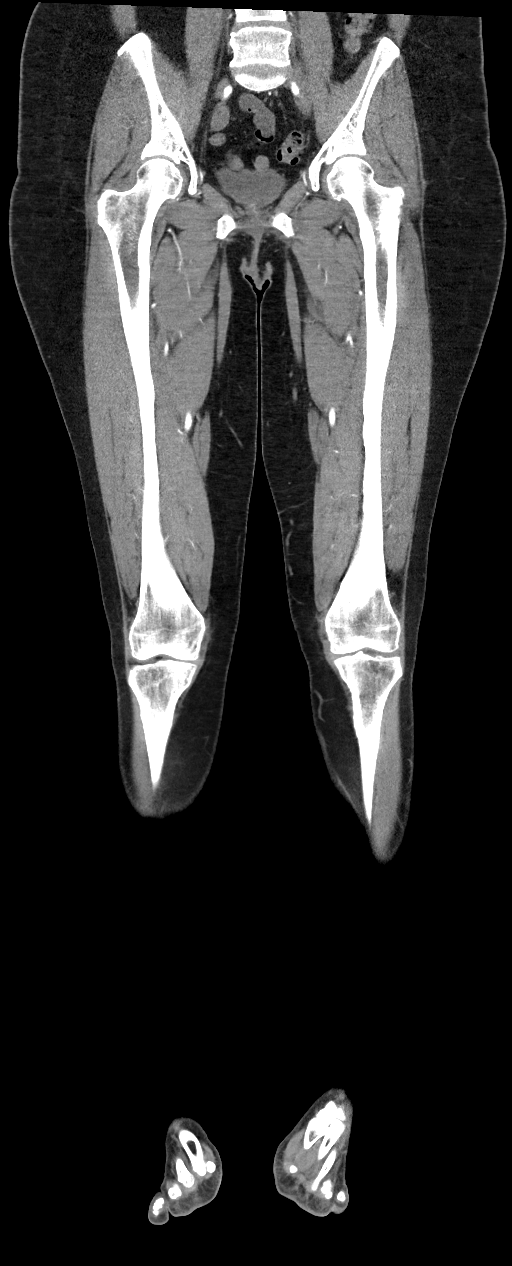
[im 78/104  soft-tissue]
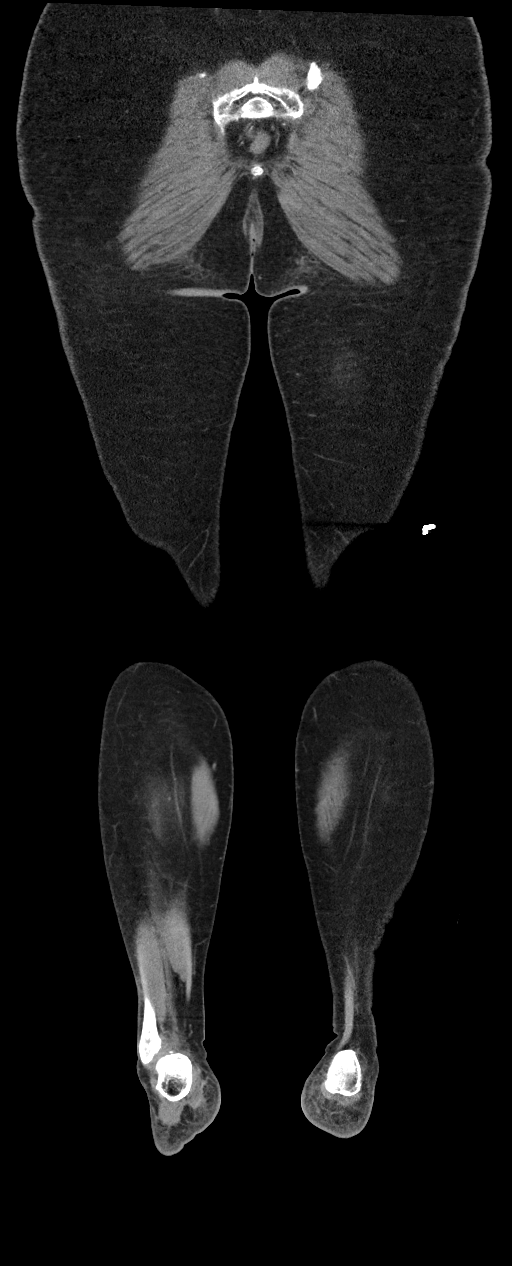

[11 of 46 positions shown; findings below may reference images not displayed]

FINDINGS: VASCULAR

Aortic bifurcation unremarkable

Bilateral iliac venous systems unremarkable. No aneurysm,
dissection, plaque, or stenosis.

Deep femoral branches, SFA, popliteal artery widely patent
bilaterally. There is contiguous three-vessel runoff on the left.
There is contiguous posterior tibial and peroneal runoff on the
right. The right anterior tibial artery is not seen below the lower
calf.

Venous phase imaging was not obtained.

On the left, enlarged venous structures posterior to femoral
condyles and distal femoral shaft confluent with the popliteal vein.
There is incomplete venous opacification to allow further
characterization.

Review of the MIP images confirms the above findings.

NONVASCULAR

Visualized portions of colon small bowel unremarkable. Bilateral
pelvic phleboliths. No pelvic ascites. Urinary bladder incompletely
distended. No adenopathy. Negative for fracture or worrisome bone
lesion. No soft tissue mass.
IMPRESSION: 1. Limited evaluation of venous anomaly around the left knee due to
arterial phase timing. I recommend re-scanning with venous phase
timing postcontrast to further characterize the venous anomaly.
Addendum to this report can be generated at that time.
2. Widely patent bilateral lower extremity arterial inflow and
outflow.
3. Three-vessel tibial runoff on the left. On the right, anterior
tibial artery is diminutive, not seen below the lower calf.

ADDENDUM:
Patient returned for repeat imaging after an additional contrast
bolus, and venous phase scanning.

There is limited additional characterization of the venous process
around the distal femur draining ultimately into the popliteal vein.
No subcutaneous or dermal component is identified. Overall, the the
studies characterize the lesion as a low-flow venous malformation.

*** End of Addendum ***
FINDINGS: VASCULAR

Aortic bifurcation unremarkable

Bilateral iliac venous systems unremarkable. No aneurysm,
dissection, plaque, or stenosis.

Deep femoral branches, SFA, popliteal artery widely patent
bilaterally. There is contiguous three-vessel runoff on the left.
There is contiguous posterior tibial and peroneal runoff on the
right. The right anterior tibial artery is not seen below the lower
calf.

Venous phase imaging was not obtained.

On the left, enlarged venous structures posterior to femoral
condyles and distal femoral shaft confluent with the popliteal vein.
There is incomplete venous opacification to allow further
characterization.

Review of the MIP images confirms the above findings.

NONVASCULAR

Visualized portions of colon small bowel unremarkable. Bilateral
pelvic phleboliths. No pelvic ascites. Urinary bladder incompletely
distended. No adenopathy. Negative for fracture or worrisome bone
lesion. No soft tissue mass.
IMPRESSION: 1. Limited evaluation of venous anomaly around the left knee due to
arterial phase timing. I recommend re-scanning with venous phase
timing postcontrast to further characterize the venous anomaly.
Addendum to this report can be generated at that time.
2. Widely patent bilateral lower extremity arterial inflow and
outflow.
3. Three-vessel tibial runoff on the left. On the right, anterior
tibial artery is diminutive, not seen below the lower calf.

## 2021-03-30 IMAGING — CT VASCULAR^CTA_LOWER_EXT_APH (ADULT)
3 of 6 series · 13 of 46 positions shown, 14 images · non-contrast
Comparison: Previous exam(s).

CLINICAL DATA: Right breast and right axilla tenderness. The
patient also developed redness and swelling in her right subareolar
breast. She has been treated with oral doxycycline for presumed
mastitis. She is currently on day 5 of her treatment, with
significantly decreased redness and swelling in her breast.

EXAM:
DIGITAL DIAGNOSTIC UNILATERAL RIGHT MAMMOGRAM WITH TOMOSYNTHESIS AND
CAD; ULTRASOUND RIGHT BREAST LIMITED
TECHNIQUE: Right digital diagnostic mammography and breast tomosynthesis was
performed. The images were evaluated with computer-aided detection.;
Targeted ultrasound examination of the right breast was performed

[Series 10: axial arterial · axial · arterial · 0.93mm/px · z∈[-1368,-684]mm · 4 of 380 slices shown, 5 images]
[im 76/380  soft-tissue]
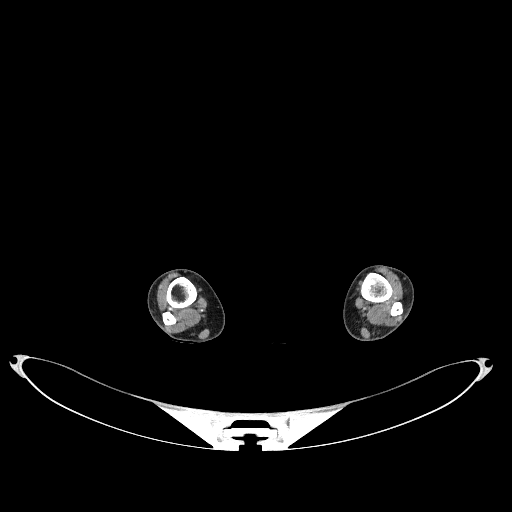
[im 76/380  bone]
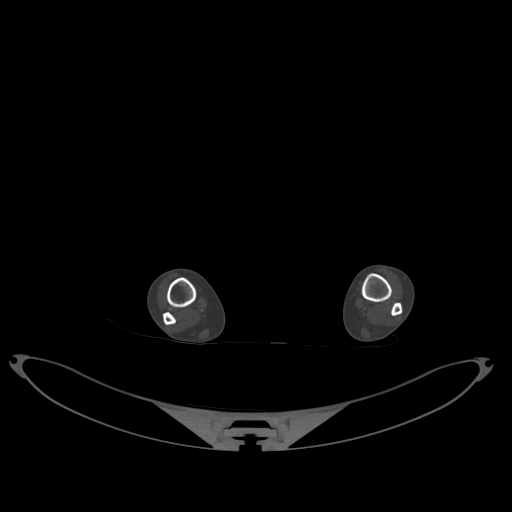
[im 152/380  soft-tissue]
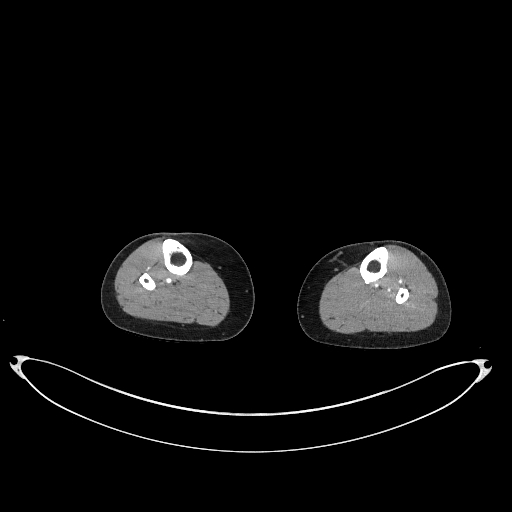
[im 228/380  soft-tissue]
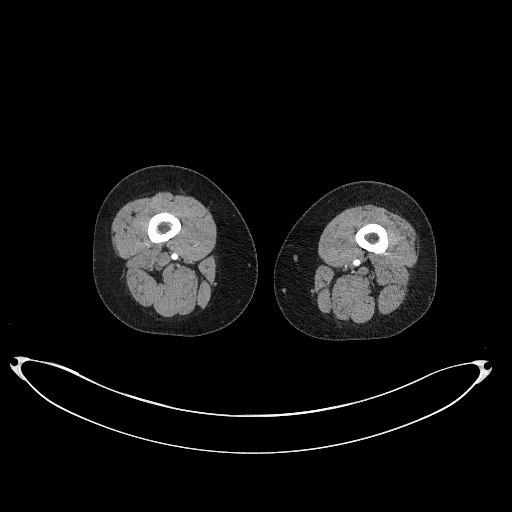
[im 304/380  soft-tissue]
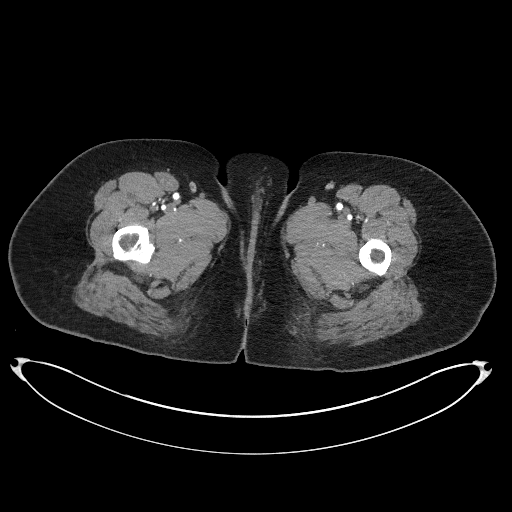

[Series 11: arterial thins · axial · arterial · 0.93mm/px · z∈[-1503,-820]mm · 6 of 1750 slices shown]
[im 140/1750  soft-tissue]
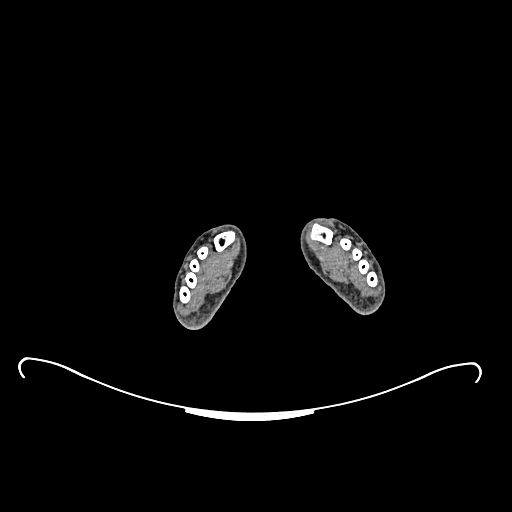
[im 350/1750  soft-tissue]
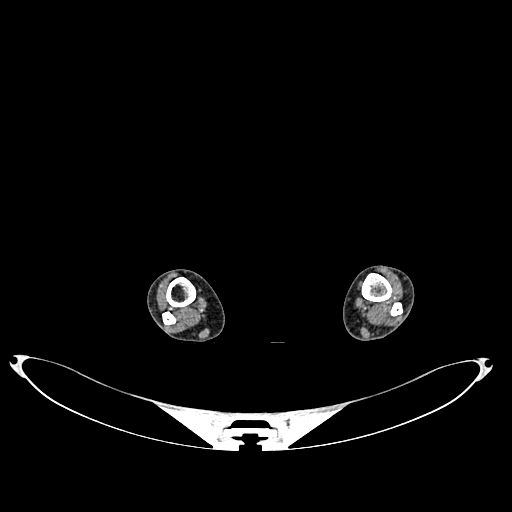
[im 560/1750  soft-tissue]
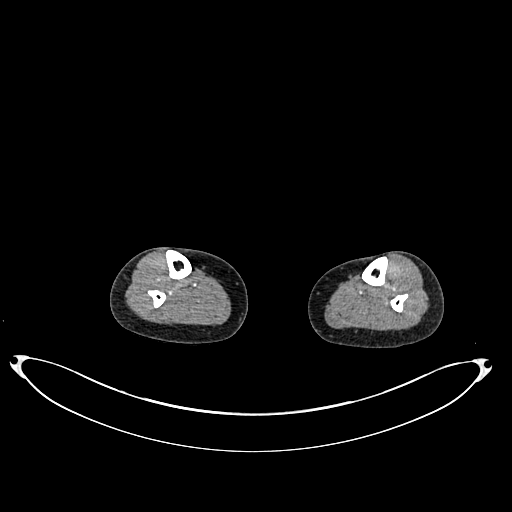
[im 770/1750  soft-tissue]
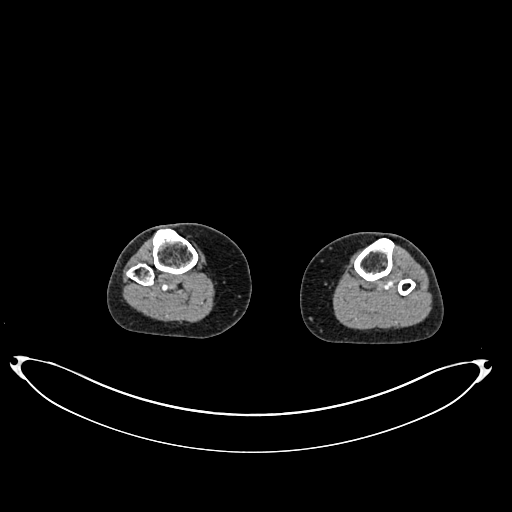
[im 980/1750  soft-tissue]
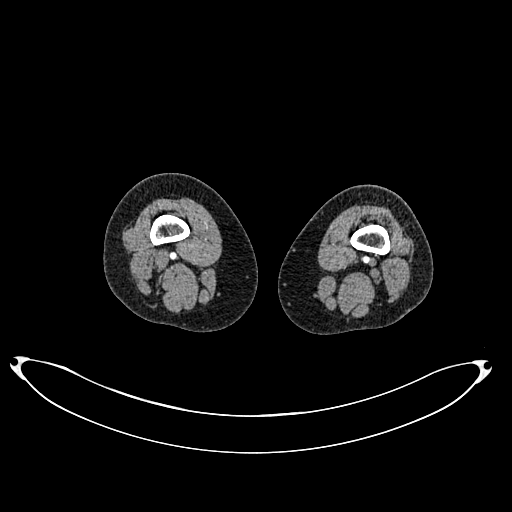
[im 1190/1750  soft-tissue]
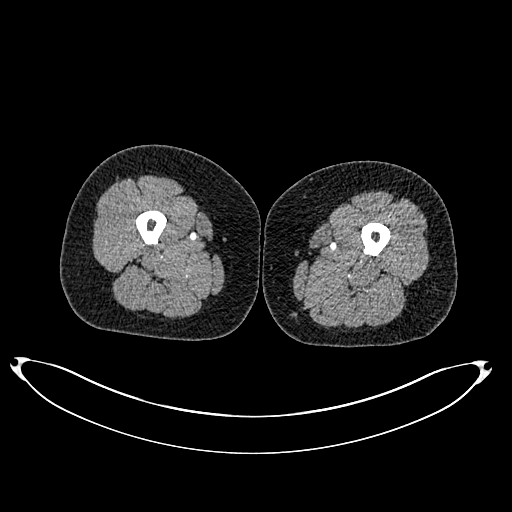

[Series 12: coronals · coronal · 0.91mm/px · 3 of 104 slices shown]
[im 26/104  soft-tissue]
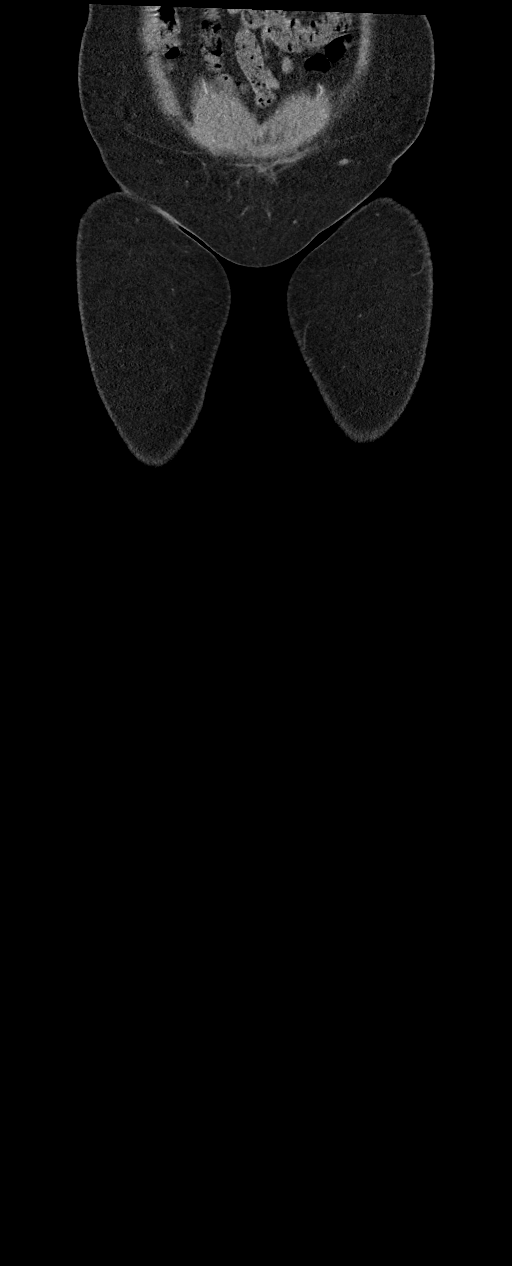
[im 52/104  soft-tissue]
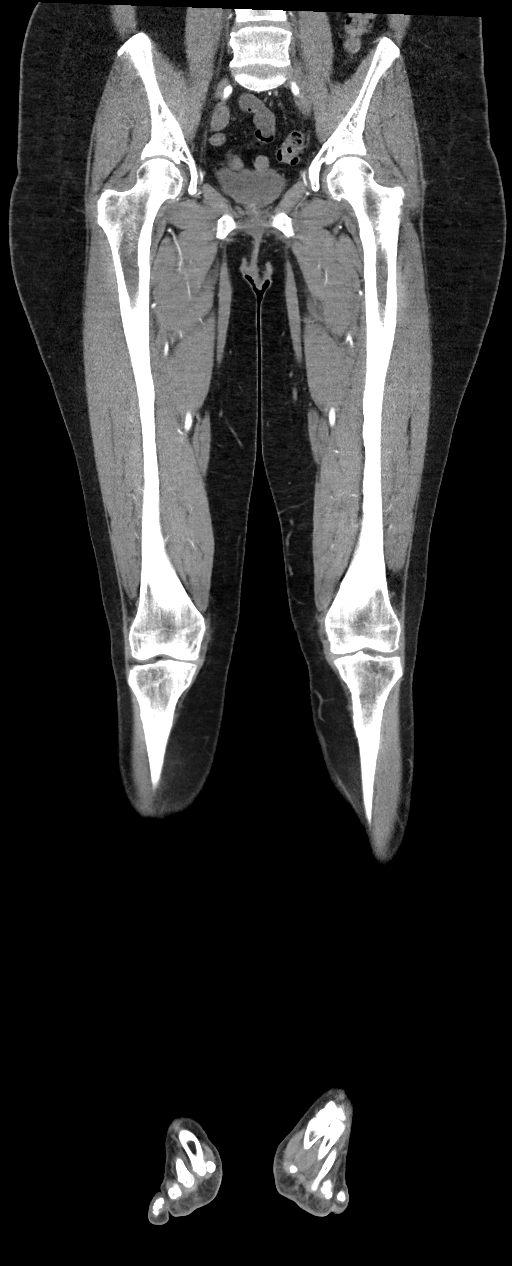
[im 78/104  soft-tissue]
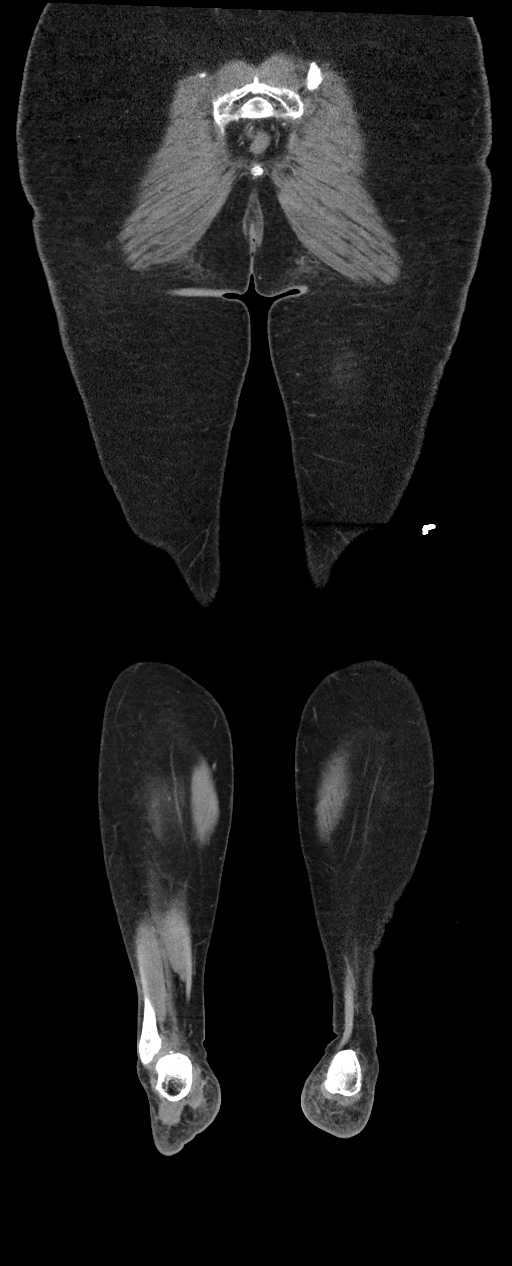

[13 of 46 positions shown; findings below may reference images not displayed]

ACR Breast Density Category c: The breast tissue is heterogeneously
dense, which may obscure small masses.
FINDINGS: Mammographically, there are no suspicious masses, areas of
architectural distortion or microcalcifications in the right breast.

On physical exam, there is an approximately 5 cm area of darker
discoloration in the right 1 o'clock breast, anterior depth, at the
nipple areolar margin with a central raised area of redness.

Targeted right breast ultrasound demonstrates mild skin and
trabecular thickening with a small subcutaneous fluid collection in
the right 1 o'clock breast, anterior depth. The findings are most
suggestive of mastitis. There is no evidence of right axillary
lymphadenopathy.
IMPRESSION: Probable right breast mastitis centered at 1 o'clock in the
subareolar breast.

Small subcutaneous fluid collection is not amenable to aspiration.

RECOMMENDATION:
Recommend sonographic follow-up of the right breast after completion
of the empiric antibiotic treatment.

I have discussed the findings and recommendations with the patient.
If applicable, a reminder letter will be sent to the patient
regarding the next appointment.

BI-RADS CATEGORY  3: Probably benign.

## 2021-03-30 MED ORDER — IOHEXOL 350 MG/ML SOLN
100.0000 mL | Freq: Once | INTRAVENOUS | Status: AC | PRN
  Administered 2021-03-30: 100 mL via INTRAVENOUS

## 2021-03-30 NOTE — Telephone Encounter (Signed)
Victorino Dike from [facility] called stating that pt requires refills of pre treatment for Contrast dye allergy. Per radiologist the venous phase needs to be evaluated. Victorino Dike will try and schedule pt next week for the scan.

## 2021-03-31 MED ORDER — PREDNISONE 50 MG PO TABS: ORAL_TABLET | ORAL | 0 refills | Status: DC

## 2021-03-31 MED ORDER — DIPHENHYDRAMINE HCL 50 MG PO TABS: ORAL_TABLET | ORAL | 0 refills | Status: DC

## 2021-03-31 NOTE — Telephone Encounter (Signed)
Prednisone and Benadryl refilled.

## 2021-03-31 NOTE — Addendum Note (Signed)
Addended by: Deliah Boston F on: 03/31/2021 12:12 PM   Modules accepted: Orders

## 2021-04-03 ENCOUNTER — Encounter: Payer: Self-pay | Admitting: Family Medicine

## 2021-04-03 ENCOUNTER — Other Ambulatory Visit: Payer: Self-pay | Admitting: Family Medicine

## 2021-04-03 DIAGNOSIS — F419 Anxiety disorder, unspecified: Secondary | ICD-10-CM

## 2021-04-03 DIAGNOSIS — F332 Major depressive disorder, recurrent severe without psychotic features: Secondary | ICD-10-CM

## 2021-04-03 IMAGING — CT CT ANGIO EXTREM LOW*L*
2 of 3 series · 10 of 33 positions shown · IV contrast (omnipaque)
Comparison: MR [DATE]
COMPARISON: MR [DATE]

Addendum:
CLINICAL DATA: Remote fall with left knee injury. AVM identified on
MRI.

EXAM:
CT ANGIOGRAPHY OF THE BILATERAL LOWER EXTREMITIES
TECHNIQUE: Multidetector CT imaging of the bilateral lower extremitieswas
performed using the standard protocol during bolus administration of
intravenous contrast. Multiplanar CT image reconstructions and MIPs
were obtained to evaluate the vascular anatomy.
CONTRAST:  100mL OMNIPAQUE IOHEXOL 350 MG/ML SOLN

[Series 5: axial st · axial · 0.59mm/px · z∈[-938,+36]mm · 9 of 577 slices shown]
[im 45/577  soft-tissue]
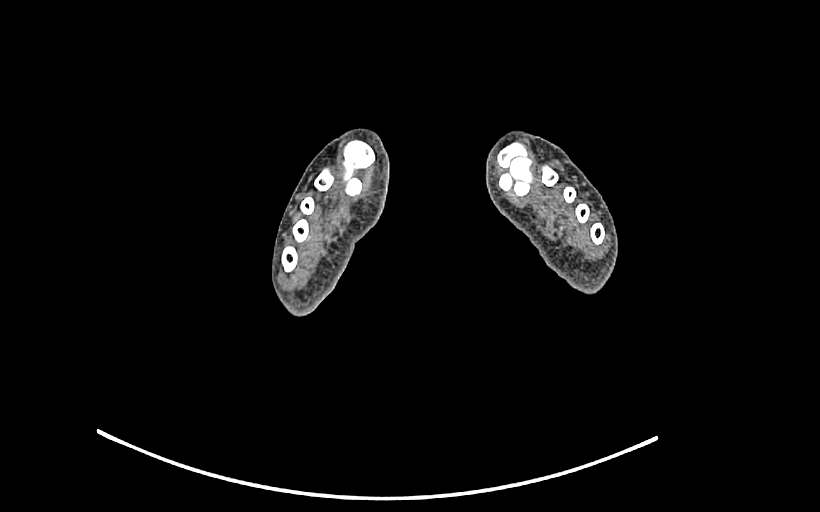
[im 133/577  bone]
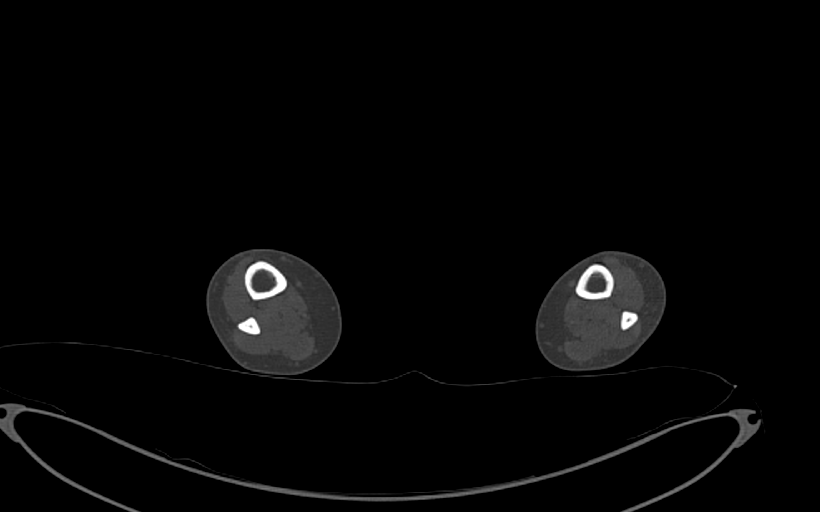
[im 178/577  soft-tissue]
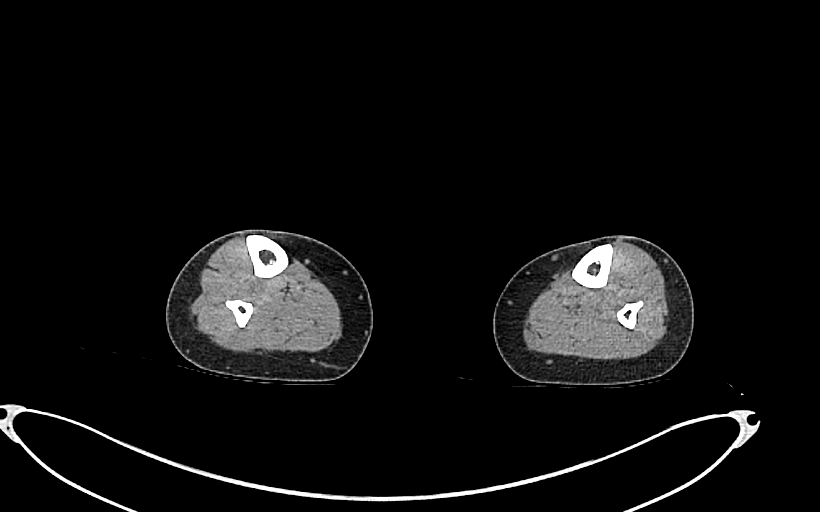
[im 222/577  bone]
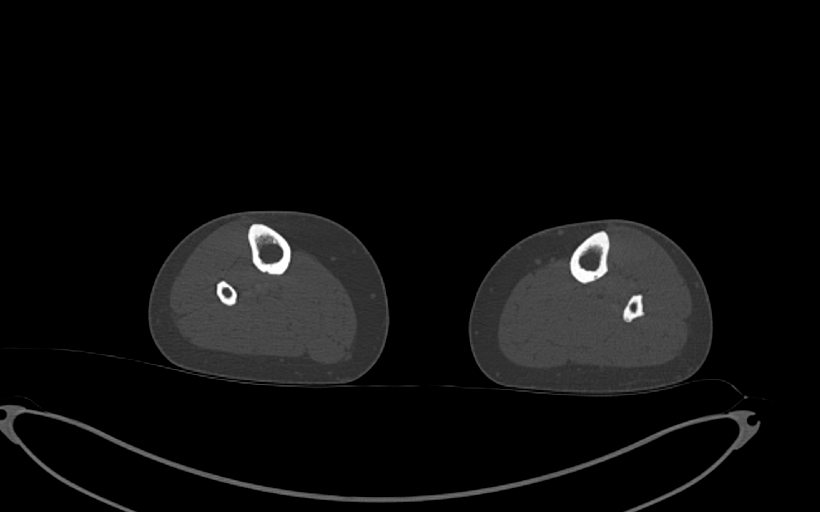
[im 311/577  soft-tissue]
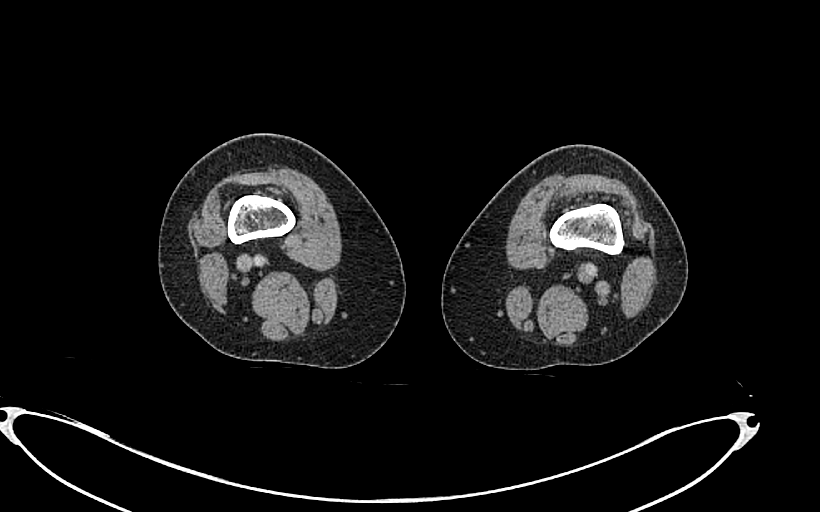
[im 355/577  bone]
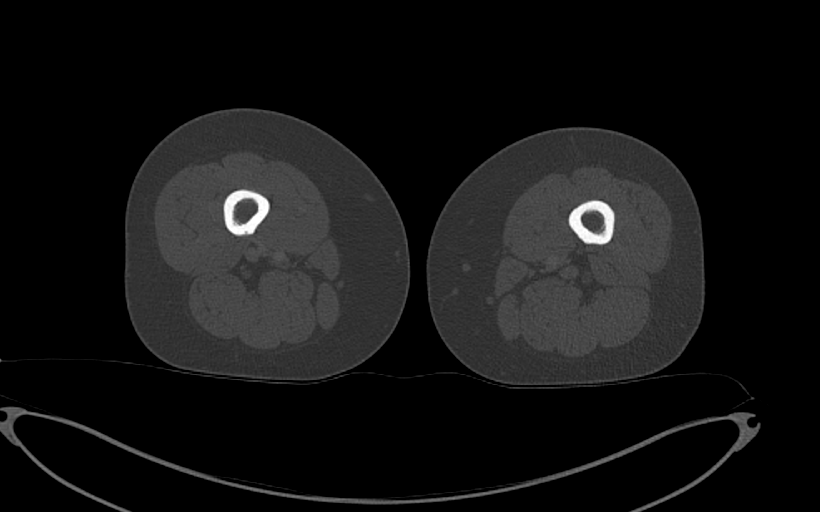
[im 399/577  soft-tissue]
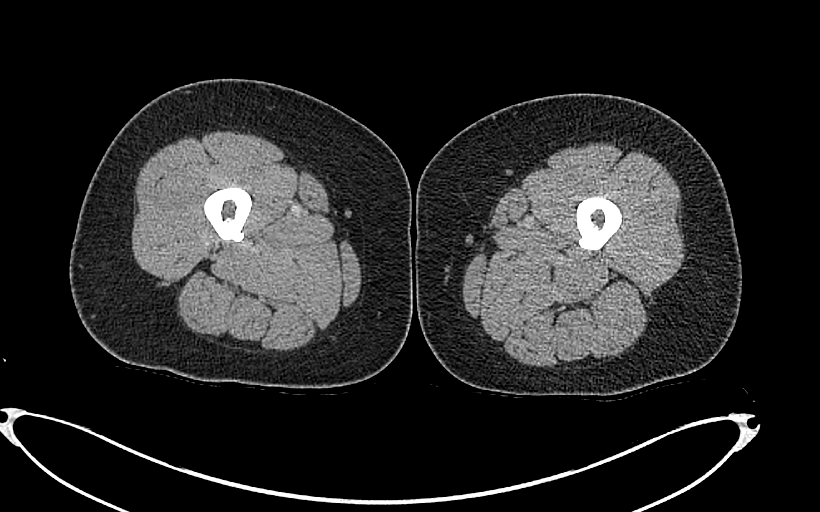
[im 488/577  bone]
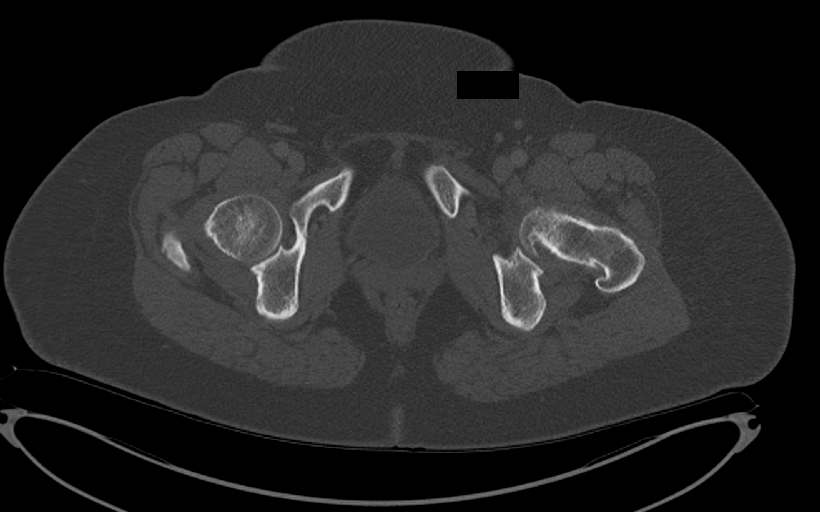
[im 532/577  soft-tissue]
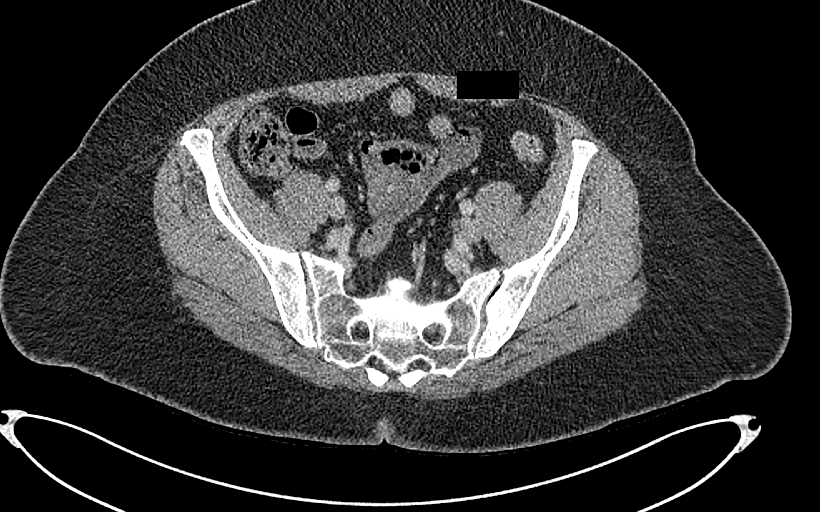

[Series 9: sag st · sagittal · 0.72mm/px · 1 of 233 slices shown]
[im 117/233  soft-tissue]
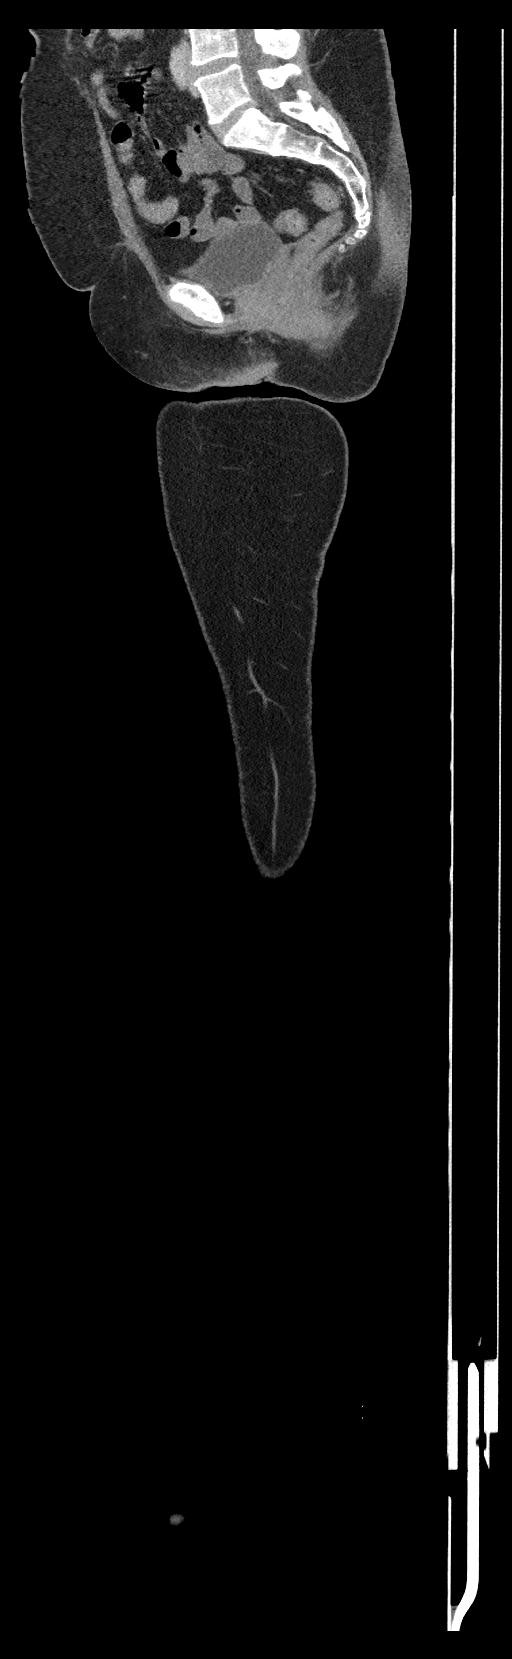

[10 of 33 positions shown; findings below may reference images not displayed]

FINDINGS: VASCULAR

Aortic bifurcation unremarkable

Bilateral iliac venous systems unremarkable. No aneurysm,
dissection, plaque, or stenosis.

Deep femoral branches, SFA, popliteal artery widely patent
bilaterally. There is contiguous three-vessel runoff on the left.
There is contiguous posterior tibial and peroneal runoff on the
right. The right anterior tibial artery is not seen below the lower
calf.

Venous phase imaging was not obtained.

On the left, enlarged venous structures posterior to femoral
condyles and distal femoral shaft confluent with the popliteal vein.
There is incomplete venous opacification to allow further
characterization.

Review of the MIP images confirms the above findings.

NONVASCULAR

Visualized portions of colon small bowel unremarkable. Bilateral
pelvic phleboliths. No pelvic ascites. Urinary bladder incompletely
distended. No adenopathy. Negative for fracture or worrisome bone
lesion. No soft tissue mass.
IMPRESSION: 1. Limited evaluation of venous anomaly around the left knee due to
arterial phase timing. I recommend re-scanning with venous phase
timing postcontrast to further characterize the venous anomaly.
Addendum to this report can be generated at that time.
2. Widely patent bilateral lower extremity arterial inflow and
outflow.
3. Three-vessel tibial runoff on the left. On the right, anterior
tibial artery is diminutive, not seen below the lower calf.

ADDENDUM:
Patient returned for repeat imaging after an additional contrast
bolus, and venous phase scanning.

There is limited additional characterization of the venous process
around the distal femur draining ultimately into the popliteal vein.
No subcutaneous or dermal component is identified. Overall, the the
studies characterize the lesion as a low-flow venous malformation.

*** End of Addendum ***
FINDINGS: VASCULAR

Aortic bifurcation unremarkable

Bilateral iliac venous systems unremarkable. No aneurysm,
dissection, plaque, or stenosis.

Deep femoral branches, SFA, popliteal artery widely patent
bilaterally. There is contiguous three-vessel runoff on the left.
There is contiguous posterior tibial and peroneal runoff on the
right. The right anterior tibial artery is not seen below the lower
calf.

Venous phase imaging was not obtained.

On the left, enlarged venous structures posterior to femoral
condyles and distal femoral shaft confluent with the popliteal vein.
There is incomplete venous opacification to allow further
characterization.

Review of the MIP images confirms the above findings.

NONVASCULAR

Visualized portions of colon small bowel unremarkable. Bilateral
pelvic phleboliths. No pelvic ascites. Urinary bladder incompletely
distended. No adenopathy. Negative for fracture or worrisome bone
lesion. No soft tissue mass.
IMPRESSION: 1. Limited evaluation of venous anomaly around the left knee due to
arterial phase timing. I recommend re-scanning with venous phase
timing postcontrast to further characterize the venous anomaly.
Addendum to this report can be generated at that time.
2. Widely patent bilateral lower extremity arterial inflow and
outflow.
3. Three-vessel tibial runoff on the left. On the right, anterior
tibial artery is diminutive, not seen below the lower calf.

## 2021-04-05 ENCOUNTER — Encounter: Payer: Self-pay | Admitting: Family Medicine

## 2021-04-07 ENCOUNTER — Ambulatory Visit (HOSPITAL_COMMUNITY): Payer: 59

## 2021-04-11 ENCOUNTER — Encounter: Payer: Self-pay | Admitting: Family Medicine

## 2021-04-12 ENCOUNTER — Ambulatory Visit: Payer: 59 | Admitting: Orthopedic Surgery

## 2021-04-13 ENCOUNTER — Telehealth: Payer: Self-pay | Admitting: Family Medicine

## 2021-04-13 ENCOUNTER — Other Ambulatory Visit: Payer: Self-pay | Admitting: Orthopedic Surgery

## 2021-04-13 DIAGNOSIS — Q279 Congenital malformation of peripheral vascular system, unspecified: Secondary | ICD-10-CM

## 2021-04-21 ENCOUNTER — Encounter: Payer: Self-pay | Admitting: Family Medicine

## 2021-04-24 ENCOUNTER — Encounter: Payer: Self-pay | Admitting: Family Medicine

## 2021-05-01 ENCOUNTER — Encounter: Payer: Self-pay | Admitting: Family Medicine

## 2021-05-04 ENCOUNTER — Ambulatory Visit
Admission: RE | Admit: 2021-05-04 | Discharge: 2021-05-04 | Disposition: A | Discharge status: Home / Self Care | Payer: Medicaid Other | Source: Ambulatory Visit | Attending: Orthopedic Surgery | Admitting: Orthopedic Surgery

## 2021-05-04 ENCOUNTER — Other Ambulatory Visit: Payer: Self-pay

## 2021-05-04 ENCOUNTER — Encounter: Payer: Self-pay | Admitting: Family Medicine

## 2021-05-04 ENCOUNTER — Encounter: Payer: Self-pay | Admitting: *Deleted

## 2021-05-04 DIAGNOSIS — Q279 Congenital malformation of peripheral vascular system, unspecified: Secondary | ICD-10-CM

## 2021-05-04 DIAGNOSIS — F419 Anxiety disorder, unspecified: Secondary | ICD-10-CM

## 2021-05-04 DIAGNOSIS — F332 Major depressive disorder, recurrent severe without psychotic features: Secondary | ICD-10-CM

## 2021-05-04 HISTORY — PX: IR RADIOLOGIST EVAL & MGMT: IMG5224

## 2021-05-04 MED ORDER — DULOXETINE HCL 30 MG PO CPEP: 30.0000 mg | ORAL_CAPSULE | Freq: Two times a day (BID) | ORAL | 2 refills | Status: DC

## 2021-05-04 NOTE — Consult Note (Signed)
Chief Complaint: Left knee pain and swelling, abnormal findings on MRI/CT  Referring Physician(s): Fuller Canada E  PCP: Deliah Boston, [place]  History of Present Illness: Olivia Zamora is a 50 y.o. female presenting as a scheduled consultation to [facility] today, kindly referred by Dr. Romeo Apple of orthopedic surgery.   Olivia Zamora is here today with her husband for the interview.   She tells me that she has had some ongoing pain in the left knee on and off for quite some time, which has been worsening over time. She has been falling recently, citing at least 15-18 events where she has fallen.  When I ask why, she tells me "weakness" and unsteadiness".    In addition to 7/10 pain in the left knee, she tells me that she has "stiffness", "heaviness", and "swelling."  She has no discoloration.  She has not worn compression stockings.  She denies any known history of DVT/PE.  She indicates that most of the discomfort at the left knee is on the medial aspect near the joint line, she indicates that most of the swelling is infrapatellar.   She tells me that she has been very active with exercise her whole life, even recently, and that she is frustrated that she has not been able to keep this up because of the worsening pain.    MRI was performed after negative Xray (12/28/20).  MRI shows that there are changes of osteoarthritis and evidence of radial tear of the posterior horn of medial meniscus.  However in addition, multiple venous/vascular structures are identified on the MRI that were worrisome for a vascular malformation.    Follow up CTA with delay was performed 03/30/21.  The CTA is negative for evidence of arterial abnormality, arterial venous shunting, dermal lesion, bone involvement with vascular malformation.  There is CT demonstration of the same multiple venous structures at the knee.  No venous lakes or spongiform mass.    Regarding additional history:   - Olivia Zamora tells me that  she has 2 sisters.  1 sister has a history of factor 5 leiden.  She herself has been tested for thrombophilia and is negative workup.   - She tells me that she has additional relatives that have been given vague diagnosis of "vascular/venous" problems - She as a teenager at 3 underwent a surgery in [place] of her right neck, with "vascular surgeon" operating for a lesion on the right neck that was apparently waxing and waning in size for months or years.  She can only tell me that the surgeon was surprised to find some sort of vascular lesion in the right neck with multiple components.  No problem since then.  - Her son was told during hospitalization that he has a "heart vascular malformation".  No further history - Both of her sisters have had history of stroke - She herself has had a stroke, 7 years ago, right sided, with some mild left upper and lower deficits. She is right hand dominant.  She tells me that ECHO was negative for shunt.    Other than not exercising as often as possible, she can do all of her other activities.   She denies any history of MI.   Social Hx: Smoked as a teenager "experimental" but quit.  Denies other drug use.  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   Mx Hx: Thyroid, asthma, stroke, HTN (not on therapy)  Past Medical History:  Diagnosis Date  .  Anxiety    Failed therapy with Paxil, Lexparo, Prozac, Wellbutrin, Xanax, and Ativan  . Arthritis   . Asthma   . Cardiac disease    Angina  . Chronic back pain   . Constipation 08/26/2019  . Depression    Failed therapy with Paxil, Lexparo, Prozac, Wellbutrin, and Ativan  . Disease of thyroid gland 12/28/2011   multiple thyroid nodules in 2013 - [place] on 03/08/14 showed heterogeneous appearance of thyroid gland without focal nodule  . Eczema   . Generalized seizure (HCC)    stress related  . GERD (gastroesophageal reflux disease)   . History of DVT (deep vein thrombosis) 1988  .  Hypertension   . IBS (irritable bowel syndrome)   . Internal derangement of left knee   . Lactose intolerance   . Migraine   . Sleep apnea    wears CPAP  . Vitamin D deficiency     Past Surgical History:  Procedure Laterality Date  . CESAREAN SECTION     x3  . COLONOSCOPY    . HEMANGIOMA EXCISION Right    shoulder  . IR RADIOLOGIST EVAL & MGMT  05/04/2021  . MASS EXCISION Right    lump from palm of hand  . OOPHORECTOMY    . OTHER SURGICAL HISTORY Right    Repair of tib-fib fracture  . TOTAL ABDOMINAL HYSTERECTOMY    . TUBAL LIGATION    . TUMOR EXCISION Right    Axilla - benign  . VASCULAR SURGERY Right 1989   blood clot removed from neck    Allergies: Iodine and Latex  Medications: Prior to Admission medications   Medication Sig Start Date End Date Taking? Authorizing Provider  albuterol (ACCUNEB) 0.63 MG/3ML nebulizer solution Take 1 ampule by nebulization every 6 (six) hours as needed for wheezing.    [provider]  aspirin EC 81 MG tablet Take 81 mg by mouth daily.    [provider]  b complex vitamins tablet Take 1 tablet by mouth daily.    [provider]  diphenhydrAMINE (BENADRYL) 50 MG tablet Take 50 mg (1 tablet) by mouth 1 hour before CT injection. 03/31/21   Gwenlyn Fudge, FNP  docusate sodium (COLACE) 100 MG capsule Take 100 mg by mouth.    [provider]  DULoxetine (CYMBALTA) 30 MG capsule Take 1 capsule (30 mg total) by mouth 2 (two) times daily. 05/04/21   Gwenlyn Fudge, FNP  lactulose (CHRONULAC) 10 GM/15ML solution Take 15 mLs (10 g total) by mouth daily as needed for mild constipation. 05/02/20   Gwenlyn Fudge, FNP  Multiple Vitamins-Minerals (MULTIVITAMIN ADULT PO) Take 1 tablet by mouth daily.    [provider]  polyethylene glycol (MIRALAX / GLYCOLAX) 17 g packet Take 17 g by mouth.    [provider]  predniSONE (DELTASONE) 50 MG tablet Take 50 mg (1 tablet) by mouth 13 hours before CT  injection, then 7 hours before, and finally 1 hour before. 03/31/21   Gwenlyn Fudge, FNP  Riboflavin (VITAMIN B-2 PO) Take 1 tablet by mouth daily.    [provider]  SYNTHROID 100 MCG tablet Take 1 tablet by mouth daily at 2 PM. 12/11/20   [provider]  SYNTHROID 112 MCG tablet Take 112 mcg by mouth daily. 07/27/20   [provider]  temazepam (RESTORIL) 30 MG capsule Take 1 capsule (30 mg total) by mouth at bedtime. 12/28/20   Gwenlyn Fudge, FNP  Vitamin D, Ergocalciferol, (  DRISDOL) 1.25 MG (50000 UT) CAPS capsule Take 50,000 Units by mouth every 7 (seven) days.    [provider]     Family History  Problem Relation Age of Onset  . Other Niece        antiphospholipid AB syndrome  . Factor V Leiden deficiency Sister   . CVA Sister        30s  . Thyroid disease Sister   . Breast cancer Sister   . Multiple sclerosis Sister   . Other Sister        guillain barre  . Depression Sister   . Migraines Sister   . Arthritis Mother   . Hypertension Mother   . Thyroid disease Mother   . Depression Mother   . Migraines Mother   . Arthritis Father   . Hypertension Father   . Depression Father   . Hypertension Maternal Grandmother   . Depression Maternal Grandmother   . Anxiety disorder Maternal Grandmother   . Hypertension Maternal Grandfather   . Hypertension Paternal Grandmother   . Hypertension Paternal Grandfather   . CVA Sister   . Thyroid disease Sister   . Depression Sister   . Migraines Sister   . Asthma Daughter   . Depression Daughter   . Anxiety disorder Daughter   . Irritable bowel syndrome Daughter   . Lactose intolerance Daughter   . Migraines Daughter   . Asthma Son   . Depression Son   . Anxiety disorder Son   . Irritable bowel syndrome Son   . Lactose intolerance Son   . Migraines Son   . Asthma Son   . Depression Son   . Anxiety disorder Son   . Irritable bowel syndrome Son   . Lactose intolerance Son   .  Migraines Son     Social History   Socioeconomic History  . Marital status: Married    Spouse name: Not on file  . Number of children: Not on file  . Years of education: Not on file  . Highest education level: Not on file  Occupational History  . Not on file  Tobacco Use  . Smoking status: Never Smoker  . Smokeless tobacco: Never Used  Substance and Sexual Activity  . Alcohol use: Never  . Drug use: Never  . Sexual activity: Not on file  Other Topics Concern  . Not on file  Social History Narrative  . Not on file   Social Determinants of Health   Financial Resource Strain: Not on file  Food Insecurity: Not on file  Transportation Needs: Not on file  Physical Activity: Not on file  Stress: Not on file  Social Connections: Not on file       Review of Systems: A 12 point ROS discussed and pertinent positives are indicated in the HPI above.  All other systems are negative.  Review of Systems  Vital Signs: BP 102/73 (BP Location: Right Arm)   Pulse 87   SpO2 99%   Physical Exam General: 50 yo female appearing stated age.  Well-developed, well-nourished.  No distress. HEENT: Atraumatic, normocephalic.  Conjugate gaze, extra-ocular motor intact. No scleral icterus or scleral injection. No lesions on external ears, nose, lips, or gums.  Oral mucosa moist, pink.  Neck: Symmetric with no goiter enlargement. She has a very faint horizontal scar at the base of the right neck, about 1.5inch.  Not in usual location for CEA.  Chest/Lungs:  Symmetric chest with inspiration/expiration.  No labored breathing.  Clear to auscultation with no wheezes, rhonchi, or rales.  Heart:  RRR, with no third heart sounds appreciated. No JVD appreciated.  Abdomen:  Soft, NT/ND, with + bowel sounds.   Genito-urinary: Deferred Neurologic: Alert & Oriented to person, place, and time.   Normal affect and insight.  Appropriate questions.  Moving all 4 extremities with gross sensory intact.   Extremities: No wound.  Capillary refill prompt. No superficial lesions.  No port wine stain or other telangiectasia.  No swelling.  No bruit appreciated at the left knee.  There is symmetric circumference of the calves There is slight asymmetry of the distal thigh.  The right is ~46cm, the left is ~47cm.    Imaging: IR Radiologist Eval & Mgmt  Result Date: 05/04/2021 Please refer to notes tab for details about interventional procedure. (Op Note)   Labs:  CBC: No results for input(s): WBC, HGB, HCT, PLT in the last 8760 hours.  COAGS: No results for input(s): INR, APTT in the last 8760 hours.  BMP: No results for input(s): NA, K, CL, CO2, GLUCOSE, BUN, CALCIUM, CREATININE, GFRNONAA, GFRAA in the last 8760 hours.  Invalid input(s): CMP  LIVER FUNCTION TESTS: No results for input(s): BILITOT, AST, ALT, ALKPHOS, PROT, ALBUMIN in the last 8760 hours.  TUMOR MARKERS: No results for input(s): AFPTM, CEA, CA199, CHROMGRNA in the last 8760 hours.  Assessment and Plan:  Olivia Forand is a 50 yo female with left knee pain and swelling, with incidental MRI finding of multiple venous collaterals, which are also present on CT.   We reviewed her symptoms today as well as both the MRI and the CT scan.    I had a lengthy discussion with her regarding the arterial, venous, and lymphatic anatomy, and the general considerations of vascular malformations.   I did let her know that these are generally, as is documented by the International Society for the Study of Vascular Anomalies (ISSVA), grouped by artery, vein, capillary, lymphatic, and combinations of the above.  Furthermore, these can be categorized into high flow and low flow malformations.  She has no evidence of a vascular tumor.   I did let her know that if anything, she might have a low flow malformation, perhaps entirely venous or combination of venous and lymphatic.  Alternative considerations might be that she has engorged or  prolific venous structures of the knee as a consequence of prior unknown DVT and/or reflux, or perhaps she just has native collection of prolific veins.   Given her symptoms, I think it is reasonable to proceed with a venous duplex with reflux study, bilateral lower extremity for better characterization.  After this, we can consider if there is any need for angiogram or other test.   She agrees and is appreciative of the input.   I did advise her that compression therapy might provide some relief, and also let her know that she is safe to continue usual activities including exercise.  I did let her know running might be unwise given her history of falls.   Plan: - Bilateral lower extremity venous duplex with reflux study, with Dr. Loreta Ave at upcoming clinic day, to evaluate for possible venous malformation.  - Continue current care.    Thank you for this interesting consult.  I greatly enjoyed meeting Shaton Lore and look forward to participating in their care.  A copy of this report was sent to the requesting provider on this date.  Electronically Signed: Gilmer Mor 05/04/2021, 4:49 PM  I spent a total of  60 Minutes   in face to face in clinical consultation, greater than 50% of which was counseling/coordinating care for left knee pain, possible vascular malformation.

## 2021-05-05 ENCOUNTER — Other Ambulatory Visit: Payer: Self-pay | Admitting: Interventional Radiology

## 2021-05-05 DIAGNOSIS — I872 Venous insufficiency (chronic) (peripheral): Secondary | ICD-10-CM

## 2021-05-27 ENCOUNTER — Encounter: Payer: Self-pay | Admitting: Family Medicine

## 2021-05-27 DIAGNOSIS — M25561 Pain in right knee: Secondary | ICD-10-CM

## 2021-05-27 DIAGNOSIS — G8929 Other chronic pain: Secondary | ICD-10-CM

## 2021-05-27 DIAGNOSIS — S83242D Other tear of medial meniscus, current injury, left knee, subsequent encounter: Secondary | ICD-10-CM

## 2021-06-05 ENCOUNTER — Encounter: Payer: Self-pay | Admitting: Family Medicine

## 2021-06-12 ENCOUNTER — Encounter: Payer: Self-pay | Admitting: Family Medicine

## 2021-06-13 ENCOUNTER — Encounter: Payer: Self-pay | Admitting: Plastic Surgery

## 2021-06-13 ENCOUNTER — Other Ambulatory Visit: Payer: Self-pay

## 2021-06-13 ENCOUNTER — Ambulatory Visit: Payer: 59 | Admitting: Plastic Surgery

## 2021-06-13 DIAGNOSIS — M542 Cervicalgia: Secondary | ICD-10-CM | POA: Diagnosis not present

## 2021-06-13 DIAGNOSIS — G8929 Other chronic pain: Secondary | ICD-10-CM | POA: Diagnosis not present

## 2021-06-13 DIAGNOSIS — M546 Pain in thoracic spine: Secondary | ICD-10-CM | POA: Diagnosis not present

## 2021-06-13 DIAGNOSIS — N62 Hypertrophy of breast: Secondary | ICD-10-CM

## 2021-06-13 DIAGNOSIS — M549 Dorsalgia, unspecified: Secondary | ICD-10-CM | POA: Insufficient documentation

## 2021-06-13 NOTE — Progress Notes (Addendum)
[name], female    DOB: [birth year], 50 y.o.   MRN: [id number]   Chief Complaint  Patient presents with   Advice Only   Breast Problem    Mammary Hyperplasia: The patient is a 50 y.o. female with a history of mammary hyperplasia for several years.  She has extremely large breasts causing symptoms that include the following: Back pain in the upper and lower back, including neck pain. She pulls or pins her bra straps to provide better lift and relief of the pressure and pain. She notices relief by holding her breast up manually.  Her shoulder straps cause grooves and pain and pressure that requires padding for relief. Pain medication is sometimes required with motrin and tylenol.  Activities that are hindered by enlarged breasts include: exercise and running.  She has tried supportive clothing as well as fitted bras without improvement.  Her breasts are extremely large with the left breast being significantly larger than the right.  She has hyperpigmentation of the inframammary area on both sides.  The sternal to nipple distance on the right is 26 cm and the left is 31 cm.  The IMF distance is 16 cm.  She is 5 feet 7 inches tall and weighs 208 pounds.  The BMI = 32.6.  Preoperative bra size = unsure due to asymmetry.  The estimated excess breast tissue to be removed at the time of surgery = 650 grams on the left and 600 grams on the right.  Mammogram history: 11/2020 negative.  Patient states she had 1 last month but I am not able to find that in the chart.  Family history of breast cancer:  sister.  Tobacco use:  quit.   The patient expresses the desire to pursue surgical intervention.  The patient had several C-sections and a hysterectomy.  She also has sleep apnea and uses CPAP.  She has a history of thyroid disease.  She had physical therapy in May for her back pain and we will see if we can get those results.     Review of Systems  Constitutional:  Positive for activity  change. Negative for appetite change.  Eyes: Negative.   Respiratory:  Negative for chest tightness and shortness of breath.   Cardiovascular: Negative.  Negative for leg swelling.  Gastrointestinal: Negative.   Endocrine: Negative.   Genitourinary: Negative.   Neurological: Negative.   Hematological: Negative.    Past Medical History:  Diagnosis Date   Anxiety    Failed therapy with Paxil, Lexparo, Prozac, Wellbutrin, Xanax, and Ativan   Arthritis    Asthma    Cardiac disease    Angina   Chronic back pain    Constipation 08/26/2019   Depression    Failed therapy with Paxil, Lexparo, Prozac, Wellbutrin, and Ativan   Disease of thyroid gland 12/28/2011   multiple thyroid nodules in 2013 - [place] on 03/08/14 showed heterogeneous appearance of thyroid gland without focal nodule   Eczema    Generalized seizure (HCC)    stress related   GERD (gastroesophageal reflux disease)    History of DVT (deep vein thrombosis) 1988   Hypertension    IBS (irritable bowel syndrome)    Internal derangement of left knee    Lactose intolerance    Migraine    Sleep apnea    wears CPAP   Vitamin D deficiency     Past Surgical History:  Procedure Laterality Date   CESAREAN SECTION  x3   COLONOSCOPY     HEMANGIOMA EXCISION Right    shoulder   IR RADIOLOGIST EVAL & MGMT  05/04/2021   MASS EXCISION Right    lump from palm of hand   OOPHORECTOMY     OTHER SURGICAL HISTORY Right    Repair of tib-fib fracture   TOTAL ABDOMINAL HYSTERECTOMY     TUBAL LIGATION     TUMOR EXCISION Right    Axilla - benign   VASCULAR SURGERY Right 1989   blood clot removed from neck      Current Outpatient Medications:    albuterol (ACCUNEB) 0.63 MG/3ML nebulizer solution, Take 1 ampule by nebulization every 6 (six) hours as needed for wheezing., Disp: , Rfl:    aspirin EC 81 MG tablet, Take 81 mg by mouth daily., Disp: , Rfl:    b complex vitamins tablet, Take 1 tablet by mouth daily., Disp: , Rfl:     diphenhydrAMINE (BENADRYL) 50 MG tablet, Take 50 mg (1 tablet) by mouth 1 hour before CT injection., Disp: 1 tablet, Rfl: 0   docusate sodium (COLACE) 100 MG capsule, Take 100 mg by mouth., Disp: , Rfl:    DULoxetine (CYMBALTA) 30 MG capsule, Take 1 capsule (30 mg total) by mouth 2 (two) times daily., Disp: 60 capsule, Rfl: 2   lactulose (CHRONULAC) 10 GM/15ML solution, Take 15 mLs (10 g total) by mouth daily as needed for mild constipation., Disp: 473 mL, Rfl: 1   Multiple Vitamins-Minerals (MULTIVITAMIN ADULT PO), Take 1 tablet by mouth daily., Disp: , Rfl:    polyethylene glycol (MIRALAX / GLYCOLAX) 17 g packet, Take 17 g by mouth., Disp: , Rfl:    predniSONE (DELTASONE) 50 MG tablet, Take 50 mg (1 tablet) by mouth 13 hours before CT injection, then 7 hours before, and finally 1 hour before., Disp: 3 tablet, Rfl: 0   Riboflavin (VITAMIN B-2 PO), Take 1 tablet by mouth daily., Disp: , Rfl:    SYNTHROID 100 MCG tablet, Take 1 tablet by mouth every other day., Disp: , Rfl:    SYNTHROID 112 MCG tablet, Take 112 mcg by mouth every other day., Disp: , Rfl:    temazepam (RESTORIL) 30 MG capsule, Take 1 capsule (30 mg total) by mouth at bedtime., Disp: 30 capsule, Rfl: 5   Vitamin D, Ergocalciferol, (DRISDOL) 1.25 MG (50000 UT) CAPS capsule, Take 50,000 Units by mouth every 7 (seven) days., Disp: , Rfl:    Objective:   Vitals:   06/13/21 1542  BP: (!) 131/95  Pulse: 93  SpO2: 98%    Physical Exam Vitals and nursing note reviewed.  Constitutional:      Appearance: Normal appearance.  HENT:     Head: Normocephalic and atraumatic.  Cardiovascular:     Rate and Rhythm: Normal rate.     Pulses: Normal pulses.  Pulmonary:     Effort: Pulmonary effort is normal.  Abdominal:     General: Abdomen is flat. There is no distension.     Tenderness: There is no abdominal tenderness.  Musculoskeletal:        General: No swelling or deformity.  Skin:    General: Skin is warm.     Capillary Refill:  Capillary refill takes less than 2 seconds.     Coloration: Skin is not jaundiced.     Findings: Rash present.  Neurological:     General: No focal deficit present.     Mental Status: She is alert and oriented to person, place,  and time.    Assessment & Plan:  Chronic bilateral thoracic back pain  Neck pain  Symptomatic mammary hypertrophy  The procedure the patient selected and that was best for the patient was discussed. The risk were discussed and include but not limited to the following:  Breast asymmetry, fluid accumulation, firmness of the breast, inability to breast feed, loss of nipple or areola, skin loss, change in skin and nipple sensation, fat necrosis of the breast tissue, bleeding, infection and healing delay.  There are risks of anesthesia and injury to nerves or blood vessels.  Allergic reaction to tape, suture and skin glue are possible.  There will be swelling.  Any of these can lead to the need for revisional surgery.  A breast reduction has potential to interfere with diagnostic procedures in the future.  This procedure is best done when the breast is fully developed.  Changes in the breast will continue to occur over time: pregnancy, weight gain or weigh loss.    Total time: 45 minutes. This includes time spent with the patient during the visit as well as time spent before and after the visit reviewing the chart, documenting the encounter and ordering pertinent studies. and literature emailed to the patient.   Physical therapy: 2022 and noted in the chart with a note from February 24, 2021 Mammogram: April 2022 and will need the results sent to [place]  The patient is a good candidate for bilateral breast reduction with possible liposuction. Will need PCP clearance for surgery due to her thyroid and sleep apnea.  Pictures were obtained of the patient and placed in the chart with the patient's or guardian's permission.   Alena Bills Adley Castello, DO

## 2021-06-18 ENCOUNTER — Encounter: Payer: Self-pay | Admitting: Family Medicine

## 2021-06-19 ENCOUNTER — Ambulatory Visit (INDEPENDENT_AMBULATORY_CARE_PROVIDER_SITE_OTHER): Payer: 59 | Admitting: Family Medicine

## 2021-06-19 ENCOUNTER — Encounter: Payer: Self-pay | Admitting: Family Medicine

## 2021-06-19 DIAGNOSIS — J988 Other specified respiratory disorders: Secondary | ICD-10-CM | POA: Diagnosis not present

## 2021-06-19 MED ORDER — LEVOCETIRIZINE DIHYDROCHLORIDE 5 MG PO TABS: 5.0000 mg | ORAL_TABLET | Freq: Every evening | ORAL | 2 refills | Status: DC

## 2021-06-19 MED ORDER — AZITHROMYCIN 250 MG PO TABS: ORAL_TABLET | ORAL | 0 refills | Status: DC

## 2021-06-19 MED ORDER — PREDNISONE 10 MG (21) PO TBPK: ORAL_TABLET | ORAL | 0 refills | Status: DC

## 2021-06-19 NOTE — Progress Notes (Signed)
   Virtual Visit  Note Due to COVID-19 pandemic this visit was conducted virtually. This visit type was conducted due to national recommendations for restrictions regarding the COVID-19 Pandemic (e.g. social distancing, sheltering in place) in an effort to limit this patient's exposure and mitigate transmission in our community. All issues noted in this document were discussed and addressed.  A physical exam was not performed with this format.  I connected with Olivia Zamora on 06/19/21 at 1258 by telephone and verified that I am speaking with the correct person using two identifiers. Olivia Zamora is currently located at home and her daughter is currently with her during the visit. The provider, Gabriel Earing, FNP is located in their office at time of visit.  I discussed the limitations, risks, security and privacy concerns of performing an evaluation and management service by telephone and the availability of in person appointments. I also discussed with the patient that there may be a patient responsible charge related to this service. The patient expressed understanding and agreed to proceed.  CC: sinus infection  History and Present Illness:  HPI Olivia Zamora reports congestion and cough for 2-3 months now. Her symptoms come and go but have gradually been getting worse. She reports coughing up tan for about 3 weeks now. She also reprots headaches, facial pressure, and ear pressure. She has taken sudafed, mucinex DM, chloicidin, delsym, and vitamin C. She has a history of allergies and asthma. She has been using her albuterol inhaler every 4 hours or so. She reports frequent sinus infections requiring antibiotics. She reports that zpak usually works well for her. She denies chest pain, shortness of breath, or fever.    ROS As per HPI.   Observations/Objective: Alert and oriented x 3. Able to speak in full sentences without difficulty. Coughing noted.   Assessment and Plan: Olivia Zamora was seen  today for sinusitis.  Diagnoses and all orders for this visit:  Respiratory infection Zpak and prednisone pack ordered. Xyzal ordered as well. Continue OTC measure for symptoms. Rest, hydration. -     azithromycin (ZITHROMAX Z-PAK) 250 MG tablet; As directed -     predniSONE (STERAPRED UNI-PAK 21 TAB) 10 MG (21) TBPK tablet; Use as directed on back of pill pack -     levocetirizine (XYZAL) 5 MG tablet; Take 1 tablet (5 mg total) by mouth every evening.    Follow Up Instructions: Return to office for new or worsening symptoms, or if symptoms persist.     I discussed the assessment and treatment plan with the patient. The patient was provided an opportunity to ask questions and all were answered. The patient agreed with the plan and demonstrated an understanding of the instructions.   The patient was advised to call back or seek an in-person evaluation if the symptoms worsen or if the condition fails to improve as anticipated.  The above assessment and management plan was discussed with the patient. The patient verbalized understanding of and has agreed to the management plan. Patient is aware to call the clinic if symptoms persist or worsen. Patient is aware when to return to the clinic for a follow-up visit. Patient educated on when it is appropriate to go to the emergency department.   Time call ended:  1310  I provided 12 minutes of  non face-to-face time during this encounter.    Gabriel Earing, FNP

## 2021-07-28 ENCOUNTER — Ambulatory Visit: Payer: 59 | Admitting: Family Medicine

## 2021-07-29 ENCOUNTER — Emergency Department (HOSPITAL_COMMUNITY)
Admission: EM | Admit: 2021-07-29 | Discharge: 2021-07-30 | Disposition: A | Discharge status: Home / Self Care | Payer: 59 | Attending: Emergency Medicine | Admitting: Emergency Medicine

## 2021-07-29 ENCOUNTER — Other Ambulatory Visit: Payer: Self-pay

## 2021-07-29 ENCOUNTER — Emergency Department (HOSPITAL_COMMUNITY): Payer: 59

## 2021-07-29 ENCOUNTER — Encounter: Payer: Self-pay | Admitting: Family Medicine

## 2021-07-29 ENCOUNTER — Encounter (HOSPITAL_COMMUNITY): Payer: Self-pay

## 2021-07-29 DIAGNOSIS — I10 Essential (primary) hypertension: Secondary | ICD-10-CM | POA: Insufficient documentation

## 2021-07-29 DIAGNOSIS — R42 Dizziness and giddiness: Secondary | ICD-10-CM | POA: Diagnosis not present

## 2021-07-29 DIAGNOSIS — Z87891 Personal history of nicotine dependence: Secondary | ICD-10-CM | POA: Insufficient documentation

## 2021-07-29 DIAGNOSIS — R519 Headache, unspecified: Secondary | ICD-10-CM | POA: Diagnosis not present

## 2021-07-29 DIAGNOSIS — J45909 Unspecified asthma, uncomplicated: Secondary | ICD-10-CM | POA: Insufficient documentation

## 2021-07-29 DIAGNOSIS — Z91040 Latex allergy status: Secondary | ICD-10-CM | POA: Diagnosis not present

## 2021-07-29 DIAGNOSIS — Z7982 Long term (current) use of aspirin: Secondary | ICD-10-CM | POA: Diagnosis not present

## 2021-07-29 DIAGNOSIS — R0789 Other chest pain: Secondary | ICD-10-CM | POA: Insufficient documentation

## 2021-07-29 DIAGNOSIS — Z20822 Contact with and (suspected) exposure to covid-19: Secondary | ICD-10-CM | POA: Diagnosis not present

## 2021-07-29 IMAGING — DX DG CHEST 1V
1 series · 1 of 1 positions shown · non-contrast
Comparison: None.

CLINICAL DATA: Shortness of breath and dizziness.

EXAM:
CHEST  1 VIEW

[chest ap]
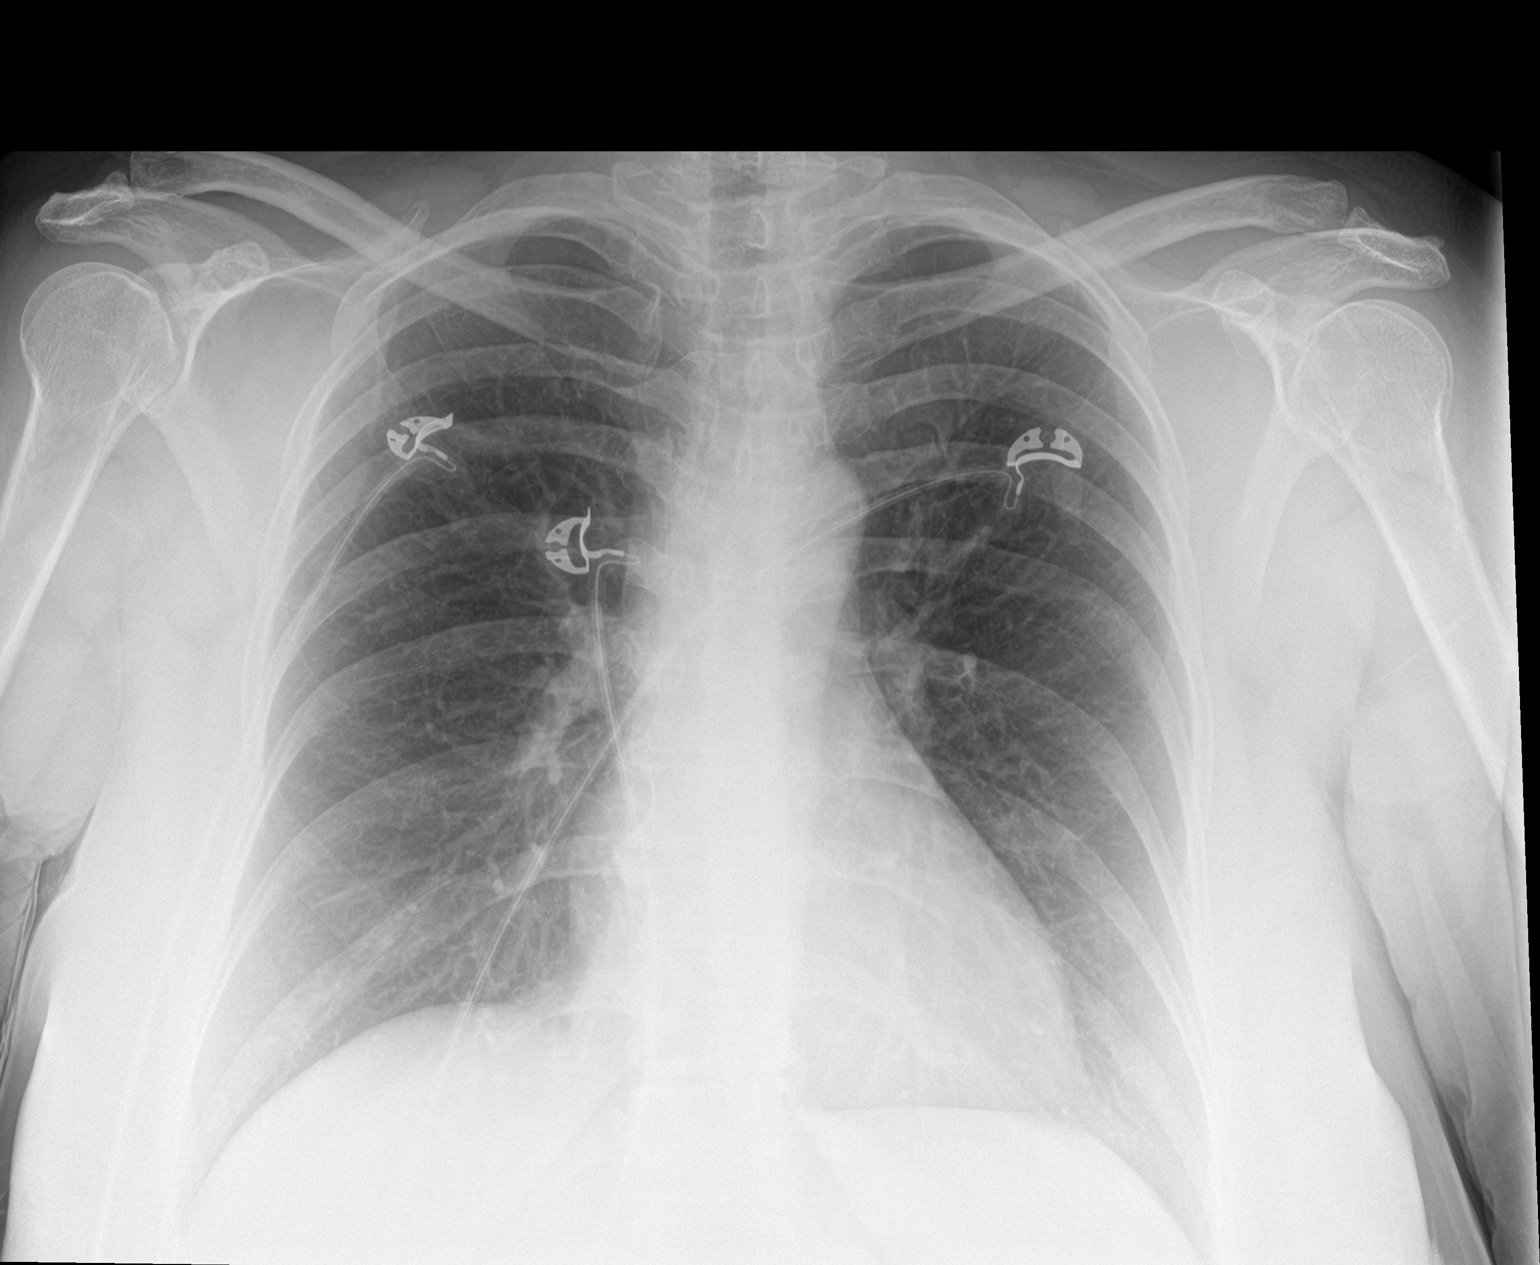

[1 of 1 positions shown; findings below may reference images not displayed]

FINDINGS: The heart size and mediastinal contours are within normal limits.
Both lungs are clear. The visualized skeletal structures are
unremarkable.
IMPRESSION: No active disease.

## 2021-07-29 IMAGING — CT CT HEAD W/O CM
3 series · 16 of 47 positions shown, 19 images · non-contrast
Comparison: None.

CLINICAL DATA: Dizziness, shortness of breath, chest pressure,
headache

EXAM:
CT HEAD WITHOUT CONTRAST
TECHNIQUE: Contiguous axial images were obtained from the base of the skull
through the vertex without intravenous contrast.

[Series 2: head w o · axial · 0.42mm/px · z∈[-12,+123]mm · 10 of 33 slices shown, 13 images]
[im 3/33  brain]
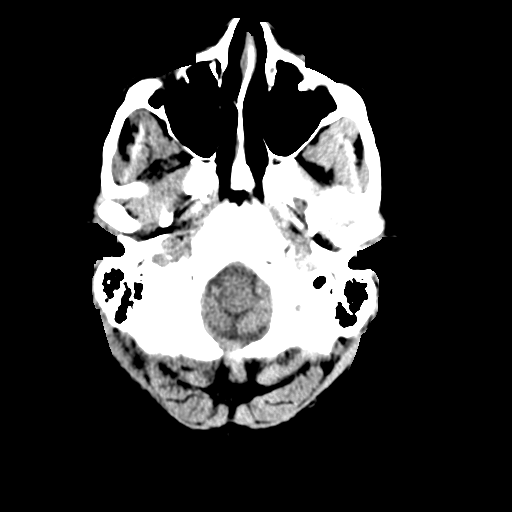
[im 3/33  bone]
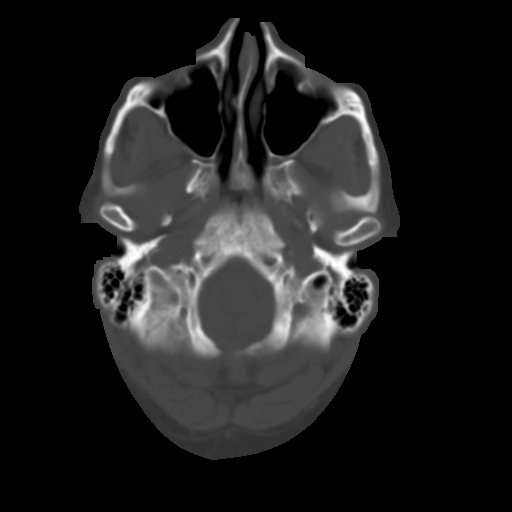
[im 6/33  brain]
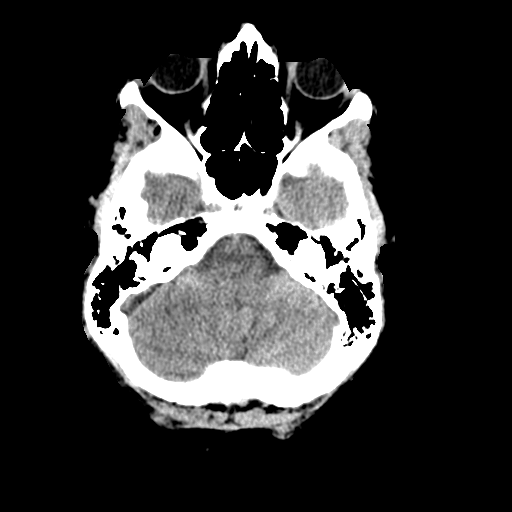
[im 9/33  brain]
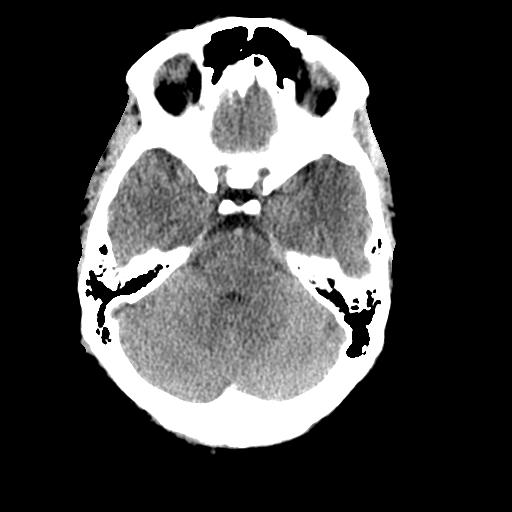
[im 12/33  brain]
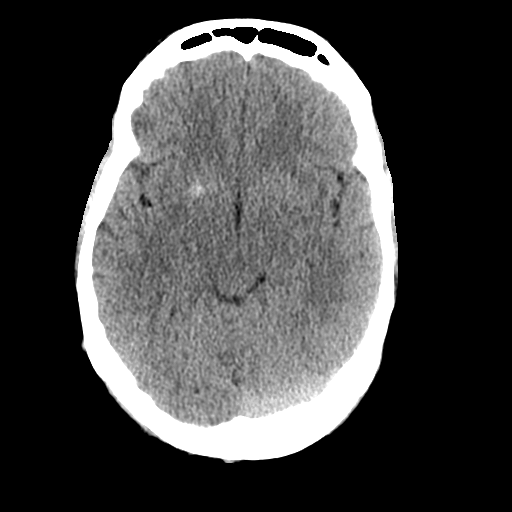
[im 15/33  brain]
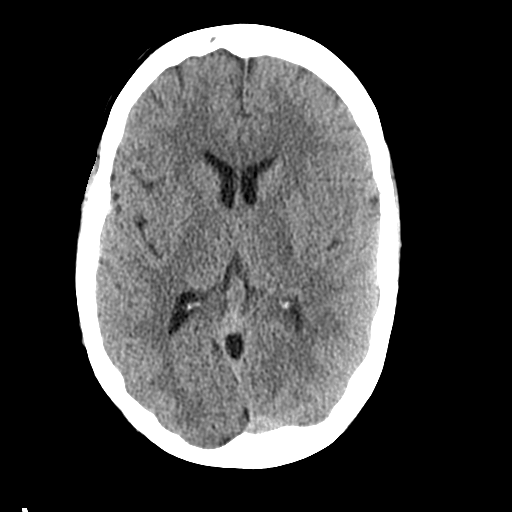
[im 15/33  bone]
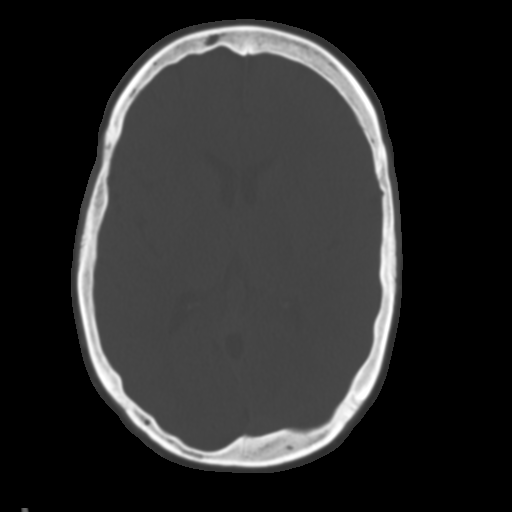
[im 18/33  brain]
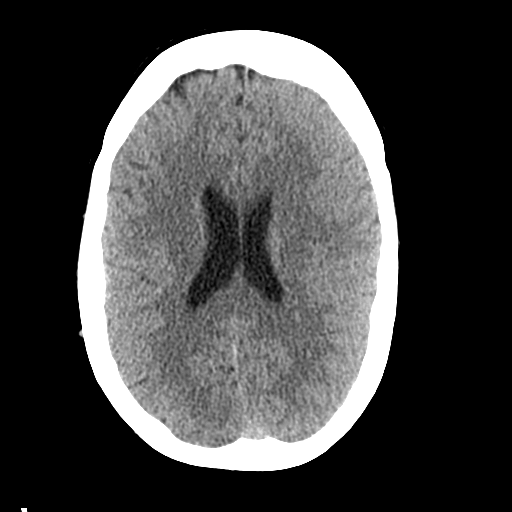
[im 21/33  brain]
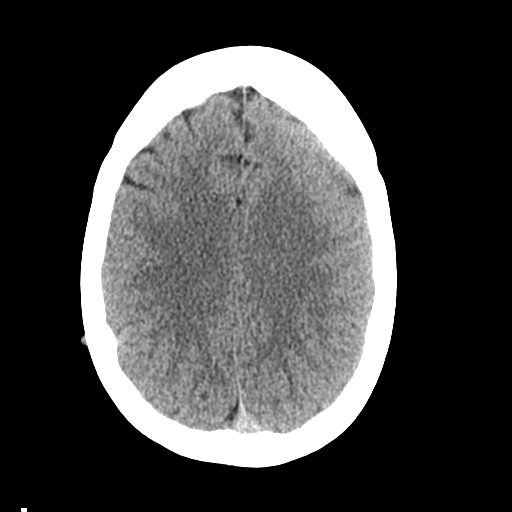
[im 25/33  brain]
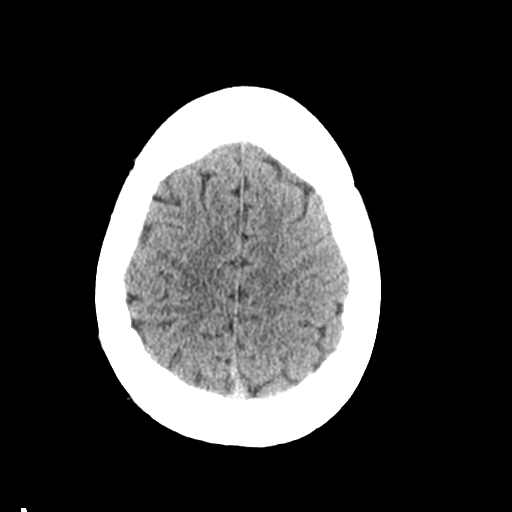
[im 27/33  brain]
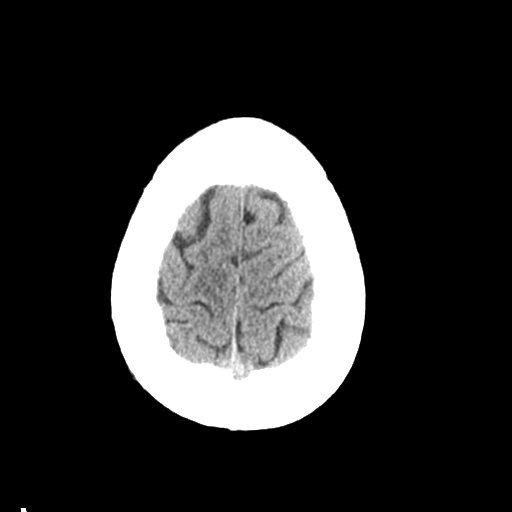
[im 27/33  bone]
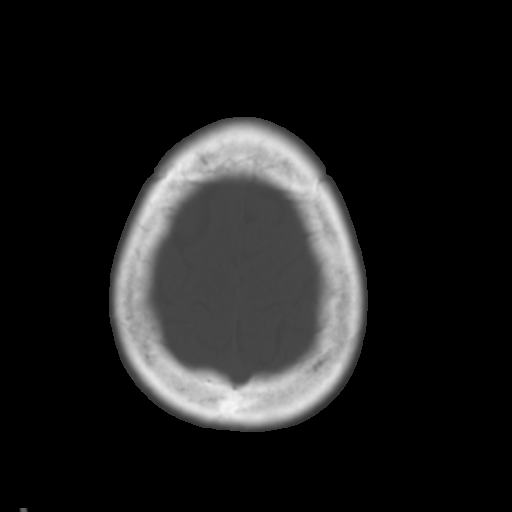
[im 30/33  brain]
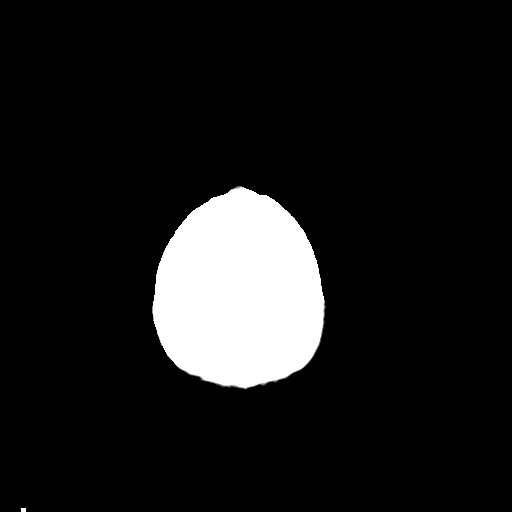

[Series 4: coronal soft · coronal · 0.33mm/px · 3 of 70 slices shown]
[im 24/70  brain]
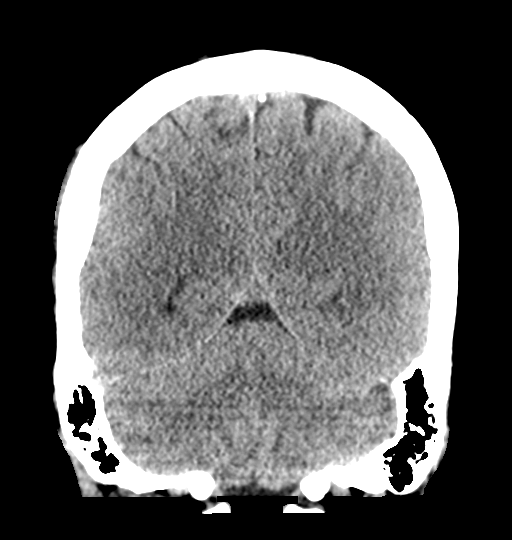
[im 31/70  brain]
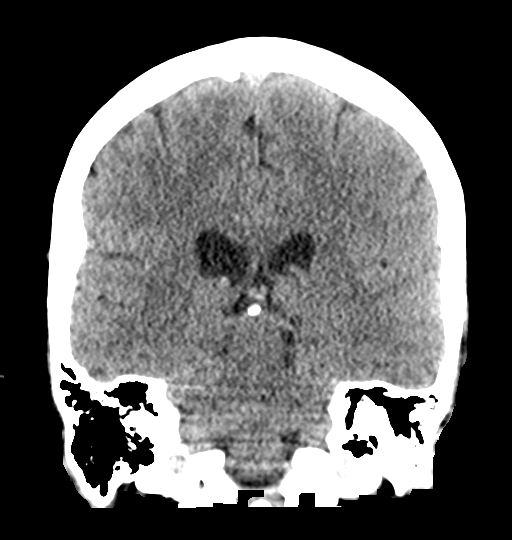
[im 39/70  brain]
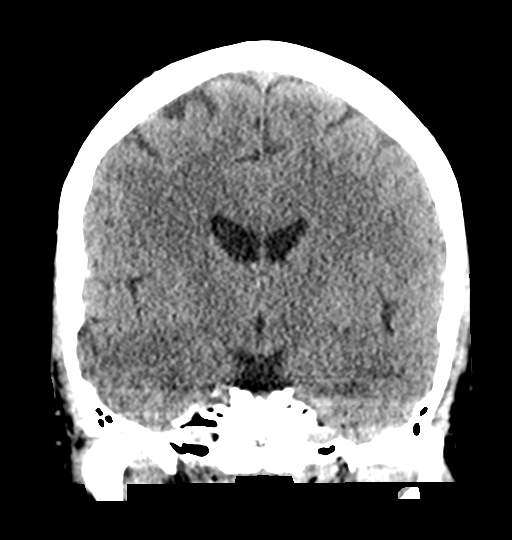

[Series 5: sagittal soft · sagittal · 0.34mm/px · 3 of 56 slices shown]
[im 19/56  brain]
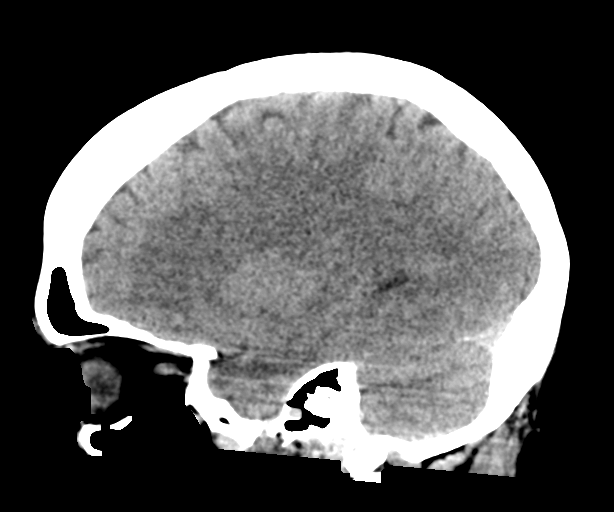
[im 28/56  brain]
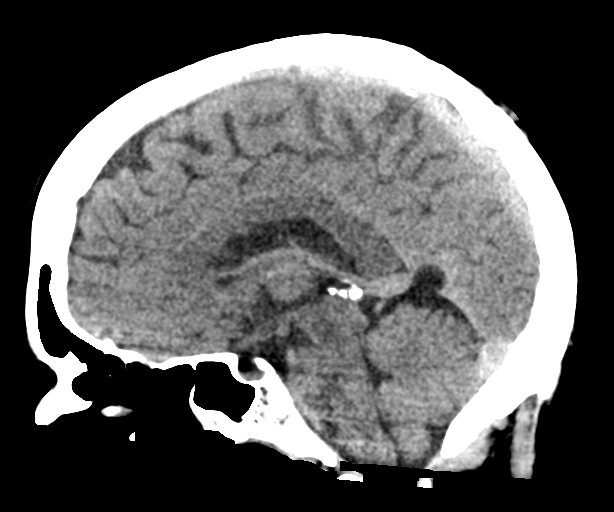
[im 37/56  brain]
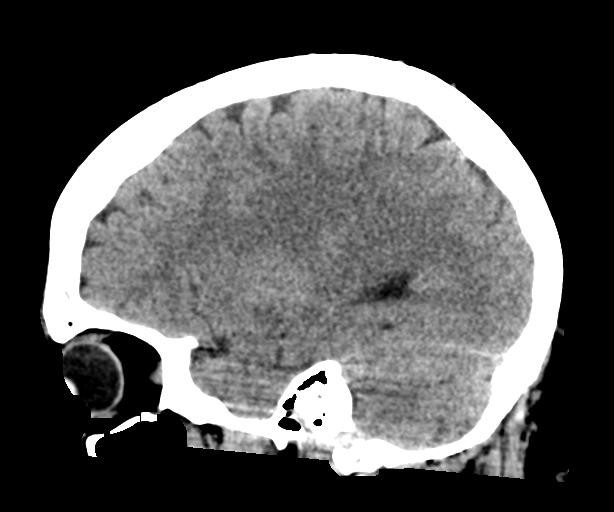

[16 of 47 positions shown; findings below may reference images not displayed]

FINDINGS: Brain: 5 mm hyperdensity within the right basal ganglia most
consistent with calcification. More subtle calcification within the
left basal ganglia. No acute infarct or hemorrhage. Lateral
ventricles and remaining midline structures are unremarkable. No
acute extra-axial fluid collections. No mass effect.

Vascular: No hyperdense vessel or unexpected calcification.

Skull: Normal. Negative for fracture or focal lesion.

Sinuses/Orbits: No acute finding.

Other: None.
IMPRESSION: 1. No acute infarct or hemorrhage.

## 2021-07-29 MED ORDER — DIPHENHYDRAMINE HCL 50 MG/ML IJ SOLN
12.5000 mg | Freq: Once | INTRAMUSCULAR | Status: AC
  Administered 2021-07-30: 12.5 mg via INTRAVENOUS
  Filled 2021-07-29: qty 1

## 2021-07-29 MED ORDER — PROCHLORPERAZINE EDISYLATE 10 MG/2ML IJ SOLN
10.0000 mg | Freq: Once | INTRAMUSCULAR | Status: AC
  Administered 2021-07-30: 10 mg via INTRAVENOUS
  Filled 2021-07-29: qty 2

## 2021-07-29 NOTE — ED Triage Notes (Addendum)
Pt. States they were walking outside when they started to feel dizzy, SHOB, chest pressure, cough and a headache. Pt. States their left calf hurts and that their fingers are cold.

## 2021-07-30 ENCOUNTER — Encounter: Payer: Self-pay | Admitting: Family Medicine

## 2021-07-30 ENCOUNTER — Emergency Department (HOSPITAL_COMMUNITY): Payer: 59

## 2021-07-30 LAB — CBC WITH DIFFERENTIAL/PLATELET
Abs Immature Granulocytes: 0.01 10*3/uL (ref 0.00–0.07)
Basophils Absolute: 0 10*3/uL (ref 0.0–0.1)
Basophils Relative: 0 %
Eosinophils Absolute: 0.1 10*3/uL (ref 0.0–0.5)
Eosinophils Relative: 2 %
HCT: 41.2 % (ref 36.0–46.0)
Hemoglobin: 14.1 g/dL (ref 12.0–15.0)
Immature Granulocytes: 0 %
Lymphocytes Relative: 31 %
Lymphs Abs: 1.4 10*3/uL (ref 0.7–4.0)
MCH: 31.5 pg (ref 26.0–34.0)
MCHC: 34.2 g/dL (ref 30.0–36.0)
MCV: 92.2 fL (ref 80.0–100.0)
Monocytes Absolute: 0.5 10*3/uL (ref 0.1–1.0)
Monocytes Relative: 11 %
Neutro Abs: 2.5 10*3/uL (ref 1.7–7.7)
Neutrophils Relative %: 56 %
Platelets: 146 10*3/uL — ABNORMAL LOW (ref 150–400)
RBC: 4.47 MIL/uL (ref 3.87–5.11)
RDW: 12.6 % (ref 11.5–15.5)
WBC: 4.5 10*3/uL (ref 4.0–10.5)
nRBC: 0 % (ref 0.0–0.2)

## 2021-07-30 LAB — BASIC METABOLIC PANEL
Anion gap: 8 (ref 5–15)
BUN: 17 mg/dL (ref 6–20)
CO2: 27 mmol/L (ref 22–32)
Calcium: 8.9 mg/dL (ref 8.9–10.3)
Chloride: 105 mmol/L (ref 98–111)
Creatinine, Ser: 0.7 mg/dL (ref 0.44–1.00)
GFR, Estimated: >60 mL/min (ref >60)
Glucose, Bld: 108 mg/dL — ABNORMAL HIGH (ref 70–99)
Potassium: 4 mmol/L (ref 3.5–5.1)
Sodium: 140 mmol/L (ref 135–145)

## 2021-07-30 LAB — D-DIMER, QUANTITATIVE: D-Dimer, Quant: 0.82 ug/mL-FEU — ABNORMAL HIGH (ref 0.00–0.50)

## 2021-07-30 LAB — TROPONIN I (HIGH SENSITIVITY)
Troponin I (High Sensitivity): 2 ng/L (ref <18)
Troponin I (High Sensitivity): 2 ng/L (ref <18)

## 2021-07-30 LAB — RESP PANEL BY RT-PCR (FLU A&B, COVID) ARPGX2
Influenza A by PCR: NEGATIVE
Influenza B by PCR: NEGATIVE
SARS Coronavirus 2 by RT PCR: NEGATIVE

## 2021-07-30 MED ORDER — ACETAMINOPHEN 325 MG PO TABS
650.0000 mg | ORAL_TABLET | Freq: Once | ORAL | Status: AC
  Administered 2021-07-30: 650 mg via ORAL
  Filled 2021-07-30: qty 2

## 2021-07-30 MED ORDER — IOHEXOL 350 MG/ML SOLN: 100.0000 mL | Freq: Once | INTRAVENOUS | Status: DC | PRN

## 2021-07-30 NOTE — ED Provider Notes (Signed)
[facility]   CSN: [id number] Arrival date & time: 07/29/21  2022     History Chief Complaint  Patient presents with   Hypertension    Olivia CumminsSherri Zamora is a 50 y.o. female.  HPI     This is a 50 year old female with history of DVT, hypertension, stroke who presents with concerns for high blood pressure.  Patient reports multiple complaints.  She states she has had worsening symptoms "lately" but over the last day she has had worsening symptoms.  She describes posterior headache and vertigo.  She has had the symptoms in the past but over the last several months they have worsened.  She has a history of migraines.  She is not taking anything for symptoms.  She reports left ear ringing that is associated with this.  Additionally she reports chest pressure and decreased exercise tolerance.  She states that she has noted that her blood pressure has been more elevated than her normal at home.  She reports her normal blood pressure is 90/60.  Patient also reports generalized fatigue and cough.  She states she is tested negative for COVID.  No known sick contacts.  She rates her headache at 10 out of 10.  Chest pain at this time.  Denies leg swelling but does have a history of blood clots.  Not currently on any medications.  Past Medical History:  Diagnosis Date   Anxiety    Failed therapy with Paxil, Lexparo, Prozac, Wellbutrin, Xanax, and Ativan   Arthritis    Asthma    Cardiac disease    Angina   Chronic back pain    Constipation 08/26/2019   Depression    Failed therapy with Paxil, Lexparo, Prozac, Wellbutrin, and Ativan   Disease of thyroid gland 12/28/2011   multiple thyroid nodules in 2013 - US on 03/08/14 showed heterogeneous appearance of thyroid gland without focal nodule   Eczema    Generalized seizure (HCC)    stress related   GERD (gastroesophageal reflux disease)    History of DVT (deep vein thrombosis) 1988   Hypertension    IBS  (irritable bowel syndrome)    Internal derangement of left knee    Lactose intolerance    Migraine    Sleep apnea    wears CPAP   Vitamin D deficiency     Patient Active Problem List   Diagnosis Date Noted   Back pain 06/13/2021   Neck pain 06/13/2021   Symptomatic mammary hypertrophy 06/13/2021   Constipation 08/26/2019   Vitamin D deficiency    Sleep apnea    Depression    Anxiety    Asthma    Disease of thyroid gland 12/28/2011    Past Surgical History:  Procedure Laterality Date   CESAREAN SECTION     x3   COLONOSCOPY     HEMANGIOMA EXCISION Right    shoulder   IR RADIOLOGIST EVAL & MGMT  05/04/2021   MASS EXCISION Right    lump from palm of hand   OOPHORECTOMY     OTHER SURGICAL HISTORY Right    Repair of tib-fib fracture   TOTAL ABDOMINAL HYSTERECTOMY     TUBAL LIGATION     TUMOR EXCISION Right    Axilla - benign   VASCULAR SURGERY Right 1989   blood clot removed from neck     OB History   No obstetric history on file.     Family History  Problem Relation Age of Onset  Other Niece        antiphospholipid AB syndrome   Factor V Leiden deficiency Sister    CVA Sister        30s   Thyroid disease Sister    Breast cancer Sister    Multiple sclerosis Sister    Other Sister        guillain barre   Depression Sister    Migraines Sister    Arthritis Mother    Hypertension Mother    Thyroid disease Mother    Depression Mother    Migraines Mother    Arthritis Father    Hypertension Father    Depression Father    Hypertension Maternal Grandmother    Depression Maternal Grandmother    Anxiety disorder Maternal Grandmother    Hypertension Maternal Grandfather    Hypertension Paternal Grandmother    Hypertension Paternal Grandfather    CVA Sister    Thyroid disease Sister    Depression Sister    Migraines Sister    Asthma Daughter    Depression Daughter    Anxiety disorder Daughter    Irritable bowel syndrome Daughter    Lactose intolerance  Daughter    Migraines Daughter    Asthma Son    Depression Son    Anxiety disorder Son    Irritable bowel syndrome Son    Lactose intolerance Son    Migraines Son    Asthma Son    Depression Son    Anxiety disorder Son    Irritable bowel syndrome Son    Lactose intolerance Son    Migraines Son     Social History   Tobacco Use   Smoking status: Former    Types: Cigarettes   Smokeless tobacco: Never  Substance Use Topics   Alcohol use: Never   Drug use: Never    Home Medications Prior to Admission medications   Medication Sig Start Date End Date Taking? Authorizing Provider  albuterol (ACCUNEB) 0.63 MG/3ML nebulizer solution Take 1 ampule by nebulization every 6 (six) hours as needed for wheezing.    [provider]  aspirin EC 81 MG tablet Take 81 mg by mouth daily.    [provider]  azithromycin (ZITHROMAX Z-PAK) 250 MG tablet As directed 06/19/21   Gabriel Earing, FNP  b complex vitamins tablet Take 1 tablet by mouth daily.    [provider]  diphenhydrAMINE (BENADRYL) 50 MG tablet Take 50 mg (1 tablet) by mouth 1 hour before CT injection. 03/31/21   Gwenlyn Fudge, FNP  docusate sodium (COLACE) 100 MG capsule Take 100 mg by mouth.    [provider]  DULoxetine (CYMBALTA) 30 MG capsule Take 1 capsule (30 mg total) by mouth 2 (two) times daily. 05/04/21   Gwenlyn Fudge, FNP  lactulose (CHRONULAC) 10 GM/15ML solution Take 15 mLs (10 g total) by mouth daily as needed for mild constipation. 05/02/20   Gwenlyn Fudge, FNP  levocetirizine (XYZAL) 5 MG tablet Take 1 tablet (5 mg total) by mouth every evening. 06/19/21   Gabriel Earing, FNP  Multiple Vitamins-Minerals (MULTIVITAMIN ADULT PO) Take 1 tablet by mouth daily.    [provider]  polyethylene glycol (MIRALAX / GLYCOLAX) 17 g packet Take 17 g by mouth.    [provider]  predniSONE (STERAPRED UNI-PAK 21 TAB) 10 MG (21) TBPK tablet Use as directed on back of  pill pack 06/19/21   Gabriel Earing, FNP  Riboflavin (VITAMIN B-2 PO) Take 1  tablet by mouth daily.    [provider]  SYNTHROID 100 MCG tablet Take 1 tablet by mouth every other day. 12/11/20   [provider]  SYNTHROID 112 MCG tablet Take 112 mcg by mouth every other day. 07/27/20   [provider]  temazepam (RESTORIL) 30 MG capsule Take 1 capsule (30 mg total) by mouth at bedtime. 12/28/20   Gwenlyn Fudge, FNP  Vitamin D, Ergocalciferol, (DRISDOL) 1.25 MG (50000 UT) CAPS capsule Take 50,000 Units by mouth every 7 (seven) days.    [provider]    Allergies    Iodine, Iohexol, and Latex  Review of Systems   Review of Systems  Constitutional:  Negative for fever.  Respiratory:  Positive for chest tightness and shortness of breath.   Cardiovascular:  Positive for palpitations. Negative for chest pain and leg swelling.  Gastrointestinal:  Positive for nausea. Negative for abdominal pain and vomiting.  Genitourinary:  Negative for dysuria.  Neurological:  Positive for dizziness and headaches. Negative for syncope and weakness.  All other systems reviewed and are negative.  Physical Exam Updated Vital Signs BP 127/76   Pulse 65   Temp 98.4 F (36.9 C) (Oral)   Resp 16   Ht 1.676 m (5\' 6" )   Wt 94.3 kg   SpO2 96%   BMI 33.57 kg/m   Physical Exam Vitals and nursing note reviewed.  Constitutional:      Appearance: She is well-developed. She is obese. She is not ill-appearing.  HENT:     Head: Normocephalic and atraumatic.     Nose: Nose normal.     Mouth/Throat:     Mouth: Mucous membranes are moist.  Eyes:     Pupils: Pupils are equal, round, and reactive to light.  Cardiovascular:     Rate and Rhythm: Normal rate and regular rhythm.     Heart sounds: Normal heart sounds.  Pulmonary:     Effort: Pulmonary effort is normal. No respiratory distress.     Breath sounds: No wheezing.  Abdominal:     General: Bowel sounds are normal.      Palpations: Abdomen is soft.  Musculoskeletal:     Cervical back: Neck supple.     Right lower leg: No edema.     Left lower leg: No edema.  Skin:    General: Skin is warm and dry.  Neurological:     Mental Status: She is alert and oriented to person, place, and time.     Comments: Cranial nerves II through XII intact, 5 out of 5 strength in all 4 extremities, no dysmetria to finger-nose-finger  Psychiatric:        Mood and Affect: Mood normal.    ED Results / Procedures / Treatments   Labs (all labs ordered are listed, but only abnormal results are displayed) Labs Reviewed  CBC WITH DIFFERENTIAL/PLATELET - Abnormal; Notable for the following components:      Result Value   Platelets 146 (*)    All other components within normal limits  BASIC METABOLIC PANEL - Abnormal; Notable for the following components:   Glucose, Bld 108 (*)    All other components within normal limits  D-DIMER, QUANTITATIVE - Abnormal; Notable for the following components:   D-Dimer, Quant 0.82 (*)    All other components within normal limits  RESP PANEL BY RT-PCR (FLU A&B, COVID) [id number]  TROPONIN I (HIGH SENSITIVITY)  TROPONIN I (HIGH SENSITIVITY)    EKG EKG Interpretation  Date/Time:  Saturday July 29 2021 20:47:29 EDT Ventricular Rate:  85 PR Interval:  158 QRS Duration: 76 QT Interval:  368 QTC Calculation: 437 R Axis:   81 Text Interpretation: Normal sinus rhythm Normal ECG Confirmed by Ross Marcus ([id number]) on 07/29/2021 10:57:52 PM  Radiology No results found.  Procedures Procedures   Medications Ordered in ED Medications  prochlorperazine (COMPAZINE) injection 10 mg (10 mg Intravenous Given 07/30/21 0006)  diphenhydrAMINE (BENADRYL) injection 12.5 mg (12.5 mg Intravenous Given 07/30/21 0006)  acetaminophen (TYLENOL) tablet 650 mg (650 mg Oral Given 07/30/21 0120)    ED Course  I have reviewed the triage vital signs and the nursing notes.  Pertinent labs & imaging  results that were available during my care of the patient were reviewed by me and considered in my medical decision making (see chart for details).    MDM Rules/Calculators/A&P                           Patient presents with multiple complaints.  She attributes this to hypertension.  She reports headache, chest discomfort, dizziness.  She is nontoxic and vital signs are largely reassuring.  Blood pressure initially 139/91.  Her physical exam is benign.  No signs of symptoms of stroke.  Normal neurologic exam.  EKG without acute ischemic changes.  Chest x-ray without pneumothorax or pneumonia.  Troponin x2 negative.  Doubt ACS.  COVID and influenza testing negative.  No significant metabolic derangements and CBC normal.  D-dimer to rule out blood clots was obtained.  It is slightly elevated at 0.82.  Patient is not hypoxic.  This is below 1.0.  She has a significant contrast allergy.  I still feel this is a very low possibility.  Discussed with patient this and whether or not she would like to proceed with CT imaging.  Patient declined.  States she feels much better after treatment of migraine.  Has a history of migraines.  Do not see any signs or symptoms of hypertensive urgency or emergency.  After history, exam, and medical workup I feel the patient has been appropriately medically screened and is safe for discharge home. Pertinent diagnoses were discussed with the patient. Patient was given return precautions.  Final Clinical Impression(s) / ED Diagnoses Final diagnoses:  Dizziness  Acute nonintractable headache, unspecified headache type  Atypical chest pain  Primary hypertension    Rx / DC Orders ED Discharge Orders     None        Shon Baton, MD 08/01/21 [phone]

## 2021-07-30 NOTE — ED Notes (Signed)
Denies relief from iv medications.

## 2021-07-30 NOTE — Discharge Instructions (Addendum)
Seen today for multiple complaints.  Your work-up is overall reassuring.  Your screening test for blood clots was slightly elevated; however, you are very low risk and I doubt that this is the cause of your symptoms.  Your heart testing is normal and your CT of the head was negative.  Follow-up with your primary doctor for blood pressure recheck to see if we need to initiate medications.

## 2021-07-31 ENCOUNTER — Encounter: Payer: Self-pay | Admitting: Family Medicine

## 2021-07-31 ENCOUNTER — Ambulatory Visit: Payer: 59 | Admitting: Family Medicine

## 2021-08-01 ENCOUNTER — Ambulatory Visit: Payer: 59 | Admitting: Family

## 2021-08-01 ENCOUNTER — Other Ambulatory Visit: Payer: Self-pay

## 2021-08-01 ENCOUNTER — Ambulatory Visit: Payer: 59 | Admitting: Family Medicine

## 2021-08-01 ENCOUNTER — Encounter: Payer: Self-pay | Admitting: Family

## 2021-08-01 VITALS — BP 112/74 | HR 98 | Temp 97.0°F | Ht 66.0 in | Wt 206.4 lb

## 2021-08-01 DIAGNOSIS — Z86718 Personal history of other venous thrombosis and embolism: Secondary | ICD-10-CM | POA: Diagnosis not present

## 2021-08-01 DIAGNOSIS — R079 Chest pain, unspecified: Secondary | ICD-10-CM | POA: Diagnosis not present

## 2021-08-01 DIAGNOSIS — R0602 Shortness of breath: Secondary | ICD-10-CM

## 2021-08-01 DIAGNOSIS — T508X5D Adverse effect of diagnostic agents, subsequent encounter: Secondary | ICD-10-CM

## 2021-08-01 DIAGNOSIS — Z8673 Personal history of transient ischemic attack (TIA), and cerebral infarction without residual deficits: Secondary | ICD-10-CM | POA: Diagnosis not present

## 2021-08-01 DIAGNOSIS — Z09 Encounter for follow-up examination after completed treatment for conditions other than malignant neoplasm: Secondary | ICD-10-CM

## 2021-08-01 DIAGNOSIS — R7989 Other specified abnormal findings of blood chemistry: Secondary | ICD-10-CM

## 2021-08-01 MED ORDER — DIPHENHYDRAMINE HCL 25 MG PO CAPS: 50.0000 mg | ORAL_CAPSULE | Freq: Once | ORAL | 0 refills | Status: DC

## 2021-08-01 MED ORDER — PREDNISONE 50 MG PO TABS: ORAL_TABLET | ORAL | 0 refills | Status: DC

## 2021-08-01 NOTE — Patient Instructions (Signed)
Shortness of Breath, Adult Shortness of breath is when a person has trouble breathing enough air or when a person feels like she or he is having trouble breathing in enough air.Shortness of breath could be a sign of a medical problem. Follow these instructions at home:  Pay attention to any changes in your symptoms. Do not use any products that contain nicotine or tobacco, such as cigarettes, e-cigarettes, and chewing tobacco. Do not smoke. Smoking is a common cause of shortness of breath. If you need help quitting, ask your health care provider. Avoid things that can irritate your airways, such as: Mold. Dust. Air pollution. Chemical fumes. Things that can cause allergy symptoms (allergens), if you have allergies. Keep your living space clean and free of mold and dust. Rest as needed. Slowly return to your usual activities. Take over-the-counter and prescription medicines only as told by your health care provider. This includes oxygen therapy and inhaled medicines. Keep all follow-up visits as told by your health care provider. This is important. Contact a health care provider if: Your condition does not improve as soon as expected. You have a hard time doing your normal activities, even after you rest. You have new symptoms. Get help right away if: Your shortness of breath gets worse. You have shortness of breath when you are resting. You feel light-headed or you faint. You have a cough that is not controlled with medicines. You cough up blood. You have pain with breathing. You have pain in your chest, arms, shoulders, or abdomen. You have a fever. You cannot walk up stairs or exercise the way that you normally do. These symptoms may represent a serious problem that is an emergency. Do not wait to see if the symptoms will go away. Get medical help right away. Call your local emergency services (911 in the [place] Do not drive yourself to the hospital. Summary Shortness of breath is  when a person has trouble breathing enough air. It can be a sign of a medical problem. Avoid things that irritate your lungs, such as smoking, pollution, mold, and dust. Pay attention to changes in your symptoms and contact your health care provider if you have a hard time completing daily activities because of shortness of breath. This information is not intended to replace advice given to you by your health care provider. Make sure you discuss any questions you have with your healthcare provider. Document Revised: 04/28/2018 Document Reviewed: 04/28/2018 Elsevier Patient Education  2022 [employer]  

## 2021-08-01 NOTE — Progress Notes (Addendum)
Subjective:    [name], female    DOB: 1971/06/16, 50 y.o.   MRN: [id number]  Chief Complaint  Patient presents with   ER follow up    8/20 AP- HTN, chest pain & left calf pain   Pt presents to the office today for ED follow up. She went to the ED on 07/29/21 with chest pain, SOB, left calf pain, and headache.   She was complaining generalized fatigue and cough.  She was negative for COVID and flu.  No known sick contacts.  She rates her headache at 10 out of 10.  She has hx migraines and had a negative CT head without contrast.   She has hx of DVT and CVA. Her D-dimer was slightly elevated. Troponin was negative. She is reporting left calf pain of a 6 out 10. Denies any swelling or heat.  Shortness of Breath This is a new problem. The current episode started in the past 7 days. The problem has been gradually worsening. Associated symptoms include chest pain, headaches and leg pain (left). Pertinent negatives include no ear pain, fever, leg swelling or sputum production.  Chest Pain  This is a new problem. The problem occurs constantly. The problem has been unchanged. The pain is present in the substernal region. The pain is at a severity of 7/10. The pain is moderate. The quality of the pain is described as heavy. Associated symptoms include headaches, leg pain (left) and shortness of breath. Pertinent negatives include no fever or sputum production.     Review of Systems  Constitutional:  Negative for fever.  HENT:  Negative for ear pain.   Respiratory:  Positive for shortness of breath. Negative for sputum production.   Cardiovascular:  Positive for chest pain. Negative for leg swelling.  Neurological:  Positive for headaches.  All other systems reviewed and are negative.  Family History  Problem Relation Age of Onset   Other Niece        antiphospholipid AB syndrome   Factor V Leiden deficiency Sister    CVA Sister        30s   Thyroid disease Sister     Breast cancer Sister    Multiple sclerosis Sister    Other Sister        guillain barre   Depression Sister    Migraines Sister    Arthritis Mother    Hypertension Mother    Thyroid disease Mother    Depression Mother    Migraines Mother    Arthritis Father    Hypertension Father    Depression Father    Hypertension Maternal Grandmother    Depression Maternal Grandmother    Anxiety disorder Maternal Grandmother    Hypertension Maternal Grandfather    Hypertension Paternal Grandmother    Hypertension Paternal Grandfather    CVA Sister    Thyroid disease Sister    Depression Sister    Migraines Sister    Asthma Daughter    Depression Daughter    Anxiety disorder Daughter    Irritable bowel syndrome Daughter    Lactose intolerance Daughter    Migraines Daughter    Asthma Son    Depression Son    Anxiety disorder Son    Irritable bowel syndrome Son    Lactose intolerance Son    Migraines Son    Asthma Son    Depression Son    Anxiety disorder Son    Irritable bowel syndrome Son    Lactose  intolerance Son    Migraines Son    Social History   Socioeconomic History   Marital status: Married    Spouse name: Not on file   Number of children: Not on file   Years of education: Not on file   Highest education level: Not on file  Occupational History   Not on file  Tobacco Use   Smoking status: Former    Types: Cigarettes   Smokeless tobacco: Never  Substance and Sexual Activity   Alcohol use: Never   Drug use: Never   Sexual activity: Not on file  Other Topics Concern   Not on file  Social History Narrative   Not on file   Social Determinants of Health   Financial Resource Strain: Not on file  Food Insecurity: Not on file  Transportation Needs: Not on file  Physical Activity: Not on file  Stress: Not on file  Social Connections: Not on file       Objective:   Physical Exam Vitals reviewed.  Constitutional:      General: She is not in acute  distress.    Appearance: She is well-developed.  HENT:     Head: Normocephalic and atraumatic.  Eyes:     Pupils: Pupils are equal, round, and reactive to light.  Neck:     Thyroid: No thyromegaly.  Cardiovascular:     Rate and Rhythm: Normal rate and regular rhythm.     Heart sounds: Normal heart sounds. No murmur heard. Pulmonary:     Effort: Pulmonary effort is normal. No respiratory distress.     Breath sounds: Normal breath sounds. No wheezing.     Comments: Sob with talking Abdominal:     General: Bowel sounds are normal. There is no distension.     Palpations: Abdomen is soft.     Tenderness: There is no abdominal tenderness.  Musculoskeletal:        General: No tenderness. Normal range of motion.     Cervical back: Normal range of motion and neck supple.     Comments: Positive homan's sign  Skin:    General: Skin is warm and dry.  Neurological:     Mental Status: She is alert and oriented to person, place, and time.     Cranial Nerves: No cranial nerve deficit.     Deep Tendon Reflexes: Reflexes are normal and symmetric.  Psychiatric:        Behavior: Behavior normal.        Thought Content: Thought content normal.        Judgment: Judgment normal.      BP (!) 141/86   Pulse 98   Temp (!) 97 F (36.1 C) (Temporal)   Ht 5\' 6"  (1.676 m)   Wt 206 lb 6.4 oz (93.6 kg)   SpO2 97%   BMI 33.31 kg/m      Assessment & Plan:  Olivia Zamora comes in today with chief complaint of ER follow up (8/20 AP- HTN, chest pain & left calf pain)   Diagnosis and orders addressed:  1. Hospital discharge follow-up  2. SOB (shortness of breath) - CT Angio Chest Pulmonary Embolism (PE) W or WO Contrast; Future  3. Chest pain, unspecified type - CT Angio Chest Pulmonary Embolism (PE) W or WO Contrast; Future  4. History of DVT (deep vein thrombosis) - CT Angio Chest Pulmonary Embolism (PE) W or WO Contrast; Future  5. H/O: CVA (cerebrovascular accident)  6. Positive D  dimer - CT  Angio Chest Pulmonary Embolism (PE) W or WO Contrast; Future  7. Allergic reaction to contrast material, subsequent encounter - predniSONE (DELTASONE) 50 MG tablet; Take 50 mg PO 13 hours before scan, then 7 hours, and one hour before scan  Dispense: 3 tablet; Refill: 0 - diphenhydrAMINE (BENADRYL ALLERGY) 25 mg capsule; Take 2 capsules (50 mg total) by mouth once for 1 dose.  Dispense: 30 capsule; Refill: 0   Labs reviewed from ED Given continued SOB, hx of DVT, and chest pain will rule out PE. She does have allergy so we are pre medicating her. Discussed in length the times to take medication. If SOB or chest pain worsens go to ED.  Health Maintenance reviewed Diet and exercise encouraged Approx 45 mins spent with patient and trying to coordinate care.   Follow up plan: Follow up with PCP in 2 days after scan.   Jannifer Rodney, FNP

## 2021-08-02 ENCOUNTER — Ambulatory Visit: Payer: 59 | Admitting: Family Medicine

## 2021-08-02 ENCOUNTER — Encounter (HOSPITAL_COMMUNITY): Payer: Self-pay | Admitting: Radiology

## 2021-08-02 ENCOUNTER — Ambulatory Visit (HOSPITAL_COMMUNITY)
Admission: RE | Admit: 2021-08-02 | Discharge: 2021-08-02 | Disposition: A | Discharge status: Home / Self Care | Payer: 59 | Source: Ambulatory Visit | Attending: Family | Admitting: Family

## 2021-08-02 DIAGNOSIS — Z86718 Personal history of other venous thrombosis and embolism: Secondary | ICD-10-CM | POA: Insufficient documentation

## 2021-08-02 DIAGNOSIS — R079 Chest pain, unspecified: Secondary | ICD-10-CM | POA: Diagnosis present

## 2021-08-02 DIAGNOSIS — R7989 Other specified abnormal findings of blood chemistry: Secondary | ICD-10-CM | POA: Diagnosis present

## 2021-08-02 DIAGNOSIS — R0602 Shortness of breath: Secondary | ICD-10-CM | POA: Insufficient documentation

## 2021-08-02 IMAGING — CT CT ANGIO CHEST
2 of 6 series · 19 of 46 positions shown · IV contrast (Omnipaque or Isovue)
Comparison: None.

CLINICAL DATA: Chest pain and pressure, shortness of breath,
elevated D-dimer

EXAM:
CT ANGIOGRAPHY CHEST WITH CONTRAST
TECHNIQUE: Multidetector CT imaging of the chest was performed using the
standard protocol during bolus administration of intravenous
contrast. Multiplanar CT image reconstructions and MIPs were
obtained to evaluate the vascular anatomy.
CONTRAST:  75mL OMNIPAQUE IOHEXOL 350 MG/ML SOLN

[Series 6: pe axial thins · axial · 0.66mm/px · z∈[+1229,+1492]mm · 16 of 359 slices shown]
[im 15/359  lung]
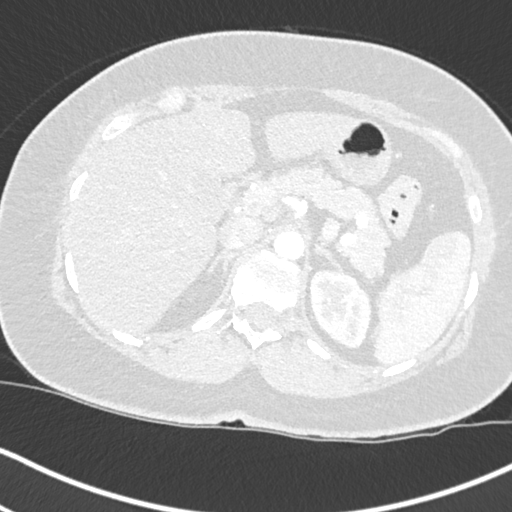
[im 45/359  soft-tissue]
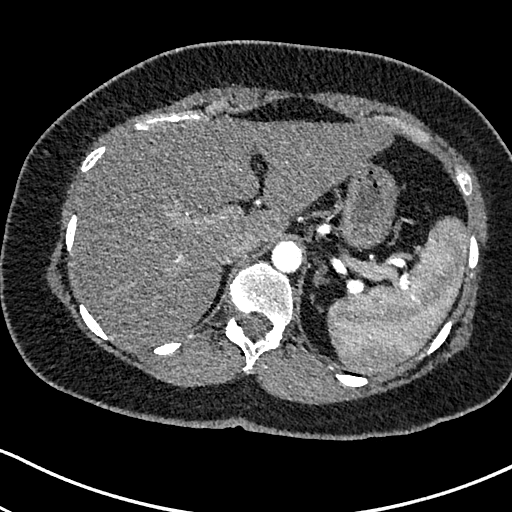
[im 60/359  lung]
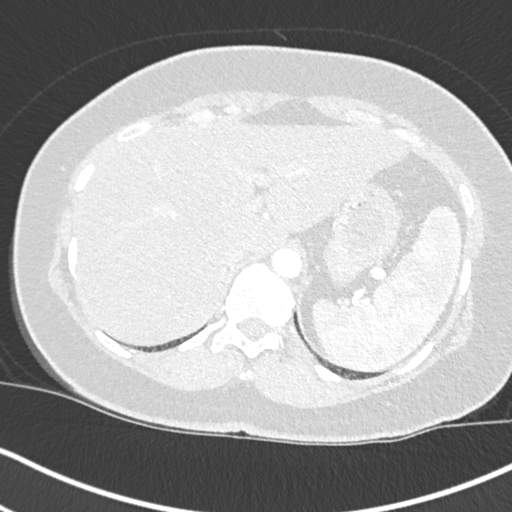
[im 90/359  soft-tissue]
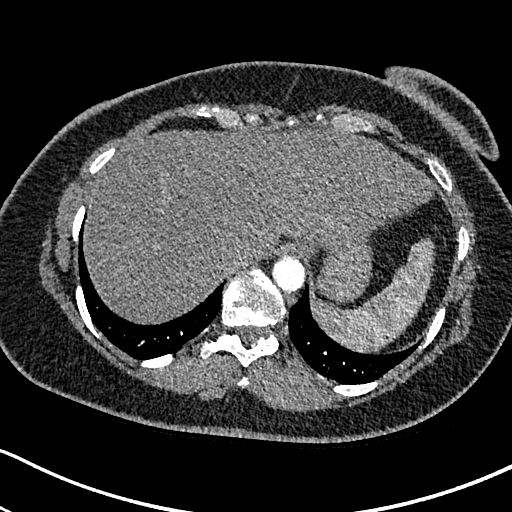
[im 105/359  lung]
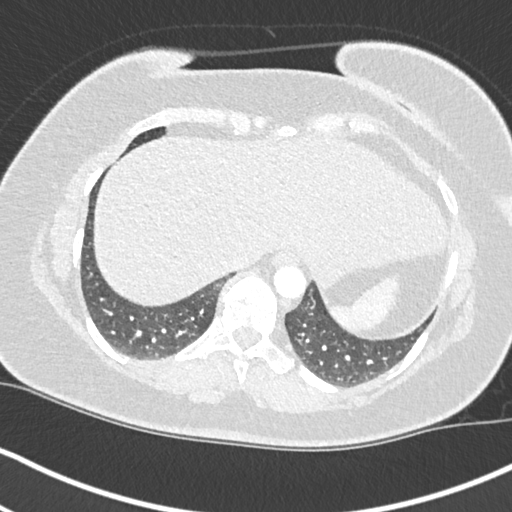
[im 120/359  soft-tissue]
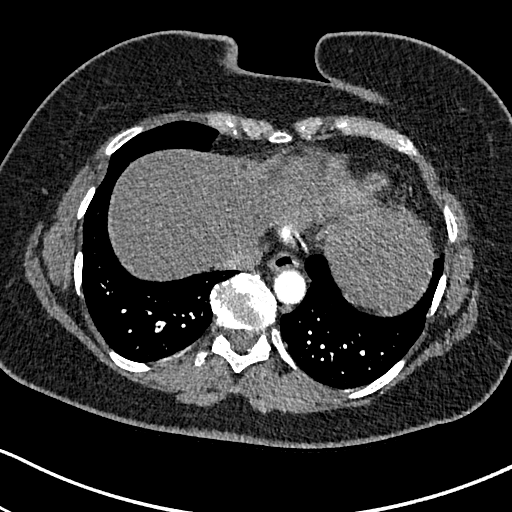
[im 150/359  lung]
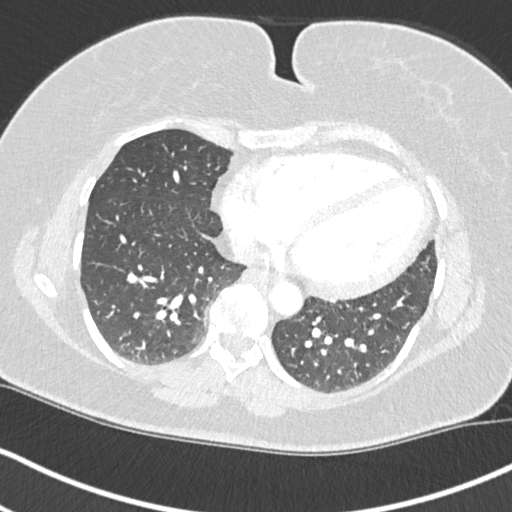
[im 165/359  soft-tissue]
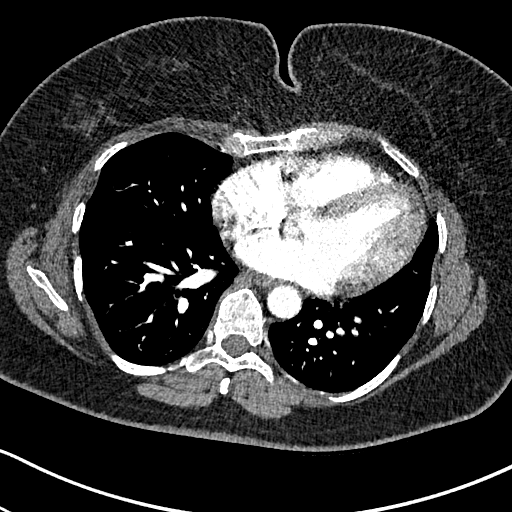
[im 194/359  lung]
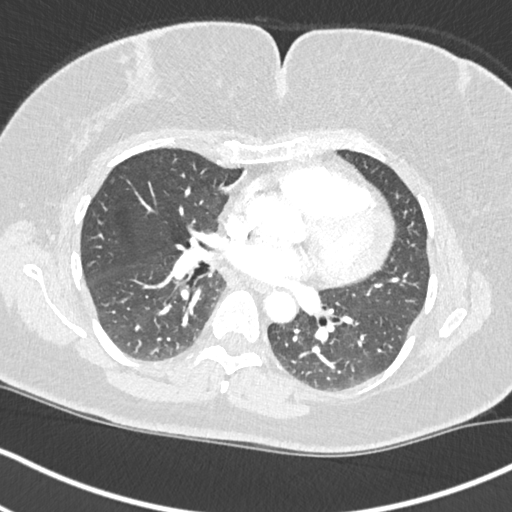
[im 209/359  soft-tissue]
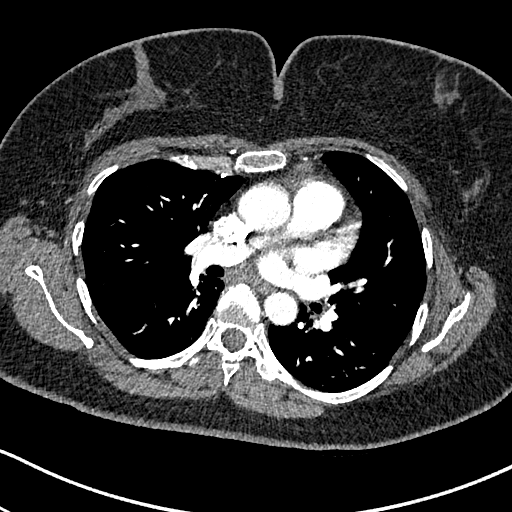
[im 239/359  lung]
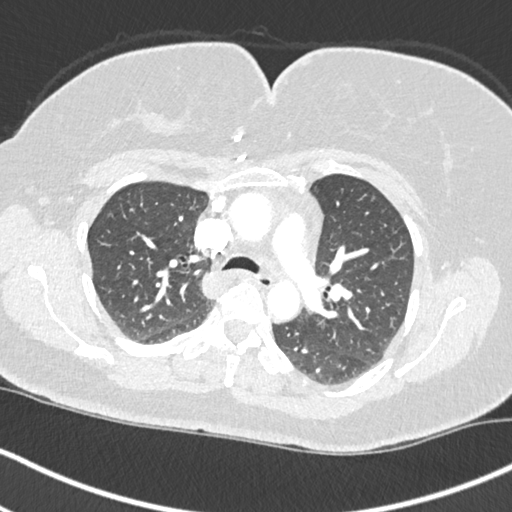
[im 254/359  soft-tissue]
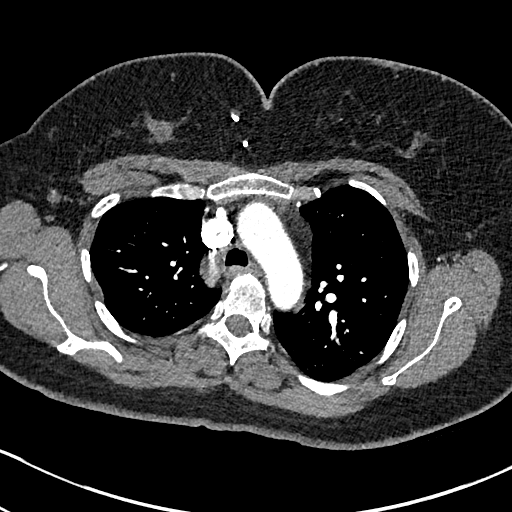
[im 269/359  lung]
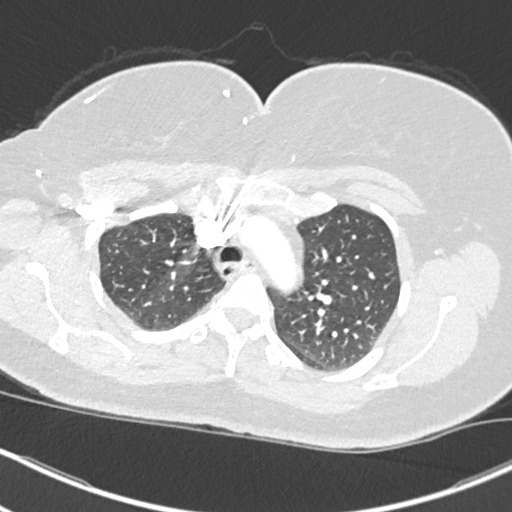
[im 299/359  soft-tissue]
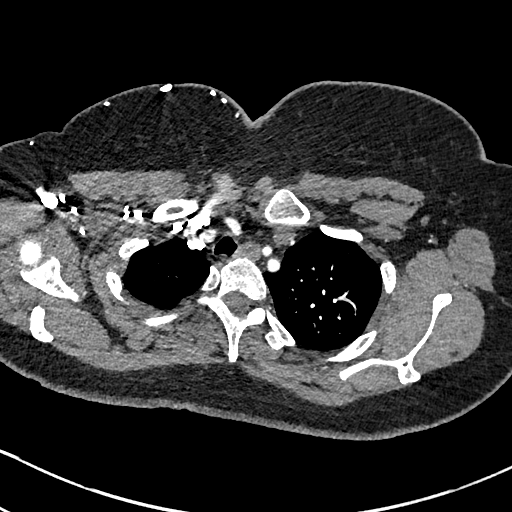
[im 314/359  lung]
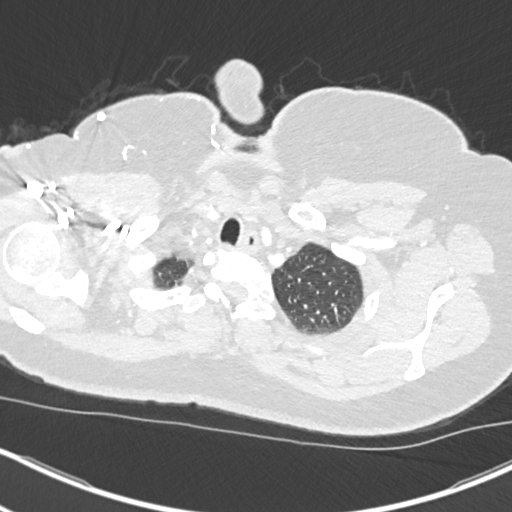
[im 344/359  soft-tissue]
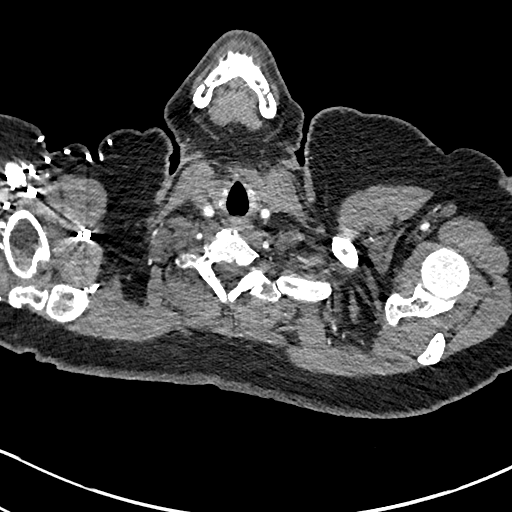

[Series 7: cor soft · coronal · 0.62mm/px · 3 of 151 slices shown]
[im 38/151  soft-tissue]
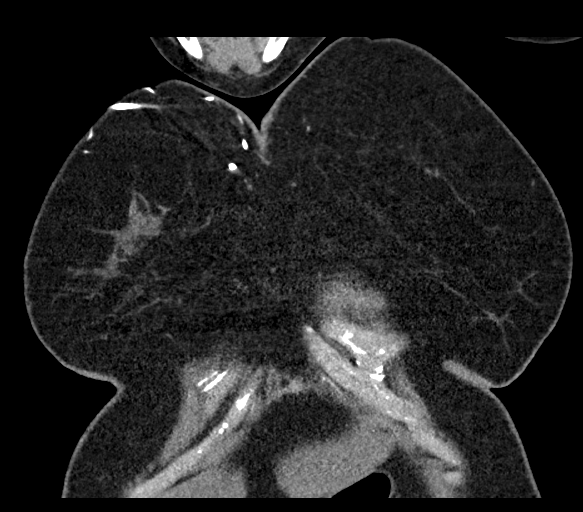
[im 76/151  soft-tissue]
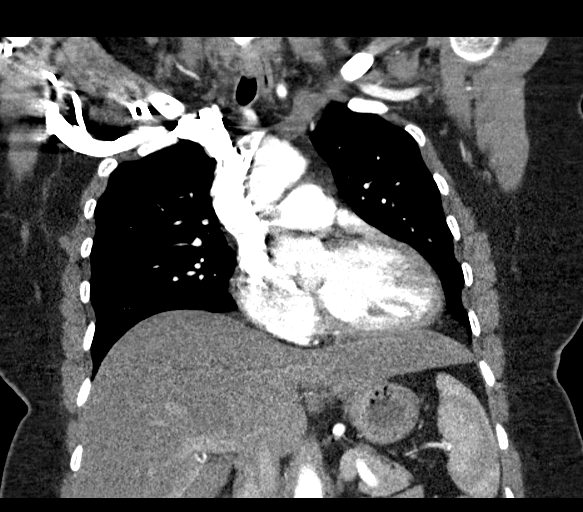
[im 113/151  soft-tissue]
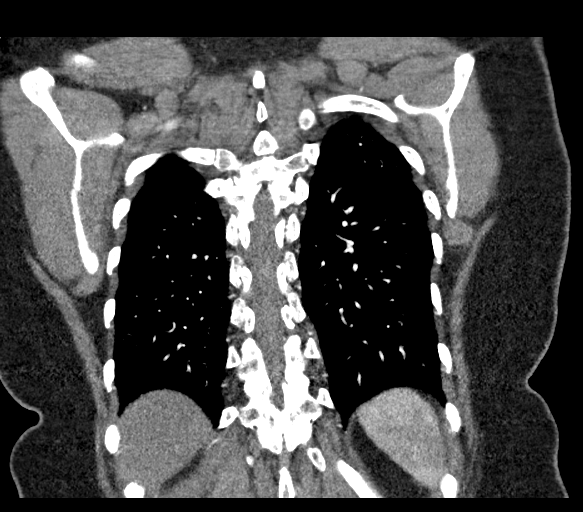

[19 of 46 positions shown; findings below may reference images not displayed]

FINDINGS: Cardiovascular: Heart size normal. No pericardial effusion. The
heart is not dilated. Satisfactory opacification of pulmonary
arteries noted, and there is no evidence of pulmonary emboli.
Adequate contrast opacification of the thoracic aorta with no
evidence of dissection, aneurysm, or stenosis. There is classic
3-vessel brachiocephalic arch anatomy without proximal stenosis.

Mediastinum/Nodes: No mass or adenopathy.

Lungs/Pleura: No pleural effusion. No pneumothorax. Lungs are clear.

Upper Abdomen: Fatty liver.  No acute findings.

Musculoskeletal: No chest wall abnormality. No acute or significant
osseous findings.

Review of the MIP images confirms the above findings.
IMPRESSION: 1. No acute PE or thoracic aortic dissection.
2. Fatty liver

## 2021-08-02 MED ORDER — IOHEXOL 350 MG/ML SOLN
100.0000 mL | Freq: Once | INTRAVENOUS | Status: AC | PRN
  Administered 2021-08-02: 75 mL via INTRAVENOUS

## 2021-08-03 ENCOUNTER — Other Ambulatory Visit: Payer: Self-pay | Admitting: Family

## 2021-08-03 ENCOUNTER — Encounter: Payer: Self-pay | Admitting: Family Medicine

## 2021-08-03 ENCOUNTER — Ambulatory Visit: Payer: 59 | Admitting: Family Medicine

## 2021-08-16 ENCOUNTER — Other Ambulatory Visit: Payer: Self-pay | Admitting: *Deleted

## 2021-08-16 DIAGNOSIS — F332 Major depressive disorder, recurrent severe without psychotic features: Secondary | ICD-10-CM

## 2021-08-16 DIAGNOSIS — F419 Anxiety disorder, unspecified: Secondary | ICD-10-CM

## 2021-08-16 MED ORDER — DULOXETINE HCL 30 MG PO CPEP: 30.0000 mg | ORAL_CAPSULE | Freq: Two times a day (BID) | ORAL | 0 refills | Status: DC

## 2022-03-01 DIAGNOSIS — Q2730 Arteriovenous malformation, site unspecified: Secondary | ICD-10-CM | POA: Insufficient documentation

## 2022-04-02 ENCOUNTER — Other Ambulatory Visit: Payer: Self-pay | Admitting: Family Medicine

## 2022-04-02 ENCOUNTER — Encounter: Payer: Self-pay | Admitting: Family Medicine

## 2022-04-02 ENCOUNTER — Other Ambulatory Visit: Payer: Self-pay

## 2022-04-02 DIAGNOSIS — I10 Essential (primary) hypertension: Secondary | ICD-10-CM

## 2022-04-02 MED ORDER — ALBUTEROL SULFATE 0.63 MG/3ML IN NEBU
1.0000 | INHALATION_SOLUTION | Freq: Four times a day (QID) | RESPIRATORY_TRACT | 1 refills | Status: DC | PRN
  Filled 2022-04-02: qty 75, 7d supply, fill #0

## 2022-04-02 NOTE — Telephone Encounter (Signed)
Hi Ms. Apps, ? ?I have researched your information and our files show [employer] is the only insurance as well as the [facility] is the only pharmacy we have listed.   ?

## 2022-04-04 ENCOUNTER — Encounter: Payer: Self-pay | Admitting: Family Medicine

## 2022-04-04 MED ORDER — LOSARTAN POTASSIUM 50 MG PO TABS: 50.0000 mg | ORAL_TABLET | Freq: Every day | ORAL | 0 refills | Status: DC

## 2022-04-05 ENCOUNTER — Ambulatory Visit: Payer: 59 | Admitting: Family Medicine

## 2022-04-05 ENCOUNTER — Encounter: Payer: Self-pay | Admitting: Family Medicine

## 2022-04-05 VITALS — BP 125/88 | HR 72 | Temp 98.3°F | Ht 66.0 in | Wt 204.6 lb

## 2022-04-05 DIAGNOSIS — M6289 Other specified disorders of muscle: Secondary | ICD-10-CM

## 2022-04-05 DIAGNOSIS — F332 Major depressive disorder, recurrent severe without psychotic features: Secondary | ICD-10-CM | POA: Diagnosis not present

## 2022-04-05 DIAGNOSIS — R269 Unspecified abnormalities of gait and mobility: Secondary | ICD-10-CM

## 2022-04-05 DIAGNOSIS — I10 Essential (primary) hypertension: Secondary | ICD-10-CM

## 2022-04-05 DIAGNOSIS — J4540 Moderate persistent asthma, uncomplicated: Secondary | ICD-10-CM

## 2022-04-05 DIAGNOSIS — R159 Full incontinence of feces: Secondary | ICD-10-CM

## 2022-04-05 DIAGNOSIS — R1319 Other dysphagia: Secondary | ICD-10-CM

## 2022-04-05 DIAGNOSIS — Z820 Family history of epilepsy and other diseases of the nervous system: Secondary | ICD-10-CM

## 2022-04-05 DIAGNOSIS — R5383 Other fatigue: Secondary | ICD-10-CM

## 2022-04-05 DIAGNOSIS — Z91041 Radiographic dye allergy status: Secondary | ICD-10-CM

## 2022-04-05 DIAGNOSIS — R202 Paresthesia of skin: Secondary | ICD-10-CM

## 2022-04-05 DIAGNOSIS — H539 Unspecified visual disturbance: Secondary | ICD-10-CM

## 2022-04-05 DIAGNOSIS — R0602 Shortness of breath: Secondary | ICD-10-CM

## 2022-04-05 MED ORDER — PREDNISONE 50 MG PO TABS: ORAL_TABLET | ORAL | 0 refills | Status: DC

## 2022-04-05 MED ORDER — BUPROPION HCL ER (XL) 150 MG PO TB24: 150.0000 mg | ORAL_TABLET | Freq: Every day | ORAL | 2 refills | Status: DC

## 2022-04-05 MED ORDER — DIPHENHYDRAMINE HCL 25 MG PO TABS: 50.0000 mg | ORAL_TABLET | Freq: Once | ORAL | 0 refills | Status: DC

## 2022-04-05 NOTE — Progress Notes (Signed)
? ?Assessment & Plan:  ?1. Essential hypertension ?Much improved today. Continue Losartan 50 mg daily.  ?- Anemia Profile B ?- CMP14+EGFR ?- Lipid panel ?- TSH ? ?2. Severe episode of recurrent major depressive disorder, without psychotic features ([place]) ?Uncontrolled. Starting Wellbutrin. ?- buPROPion (WELLBUTRIN XL) 150 MG 24 hr tablet; Take 1 tablet (150 mg total) by mouth daily.  Dispense: 30 tablet; Refill: 2 ? ?3. Moderate persistent asthma without complication ?Continue Albuterol as needed. ? ?4. Muscle fatigue ?- Anemia Profile B ?- CMP14+EGFR ?- TSH ?- Brain natriuretic peptide ?- T4, free ?- C-reactive protein ?- Sedimentation rate ?- ANA ?- CK ?- MR Brain W Wo Contrast; Future ? ?5. Exhaustion ?- TSH ?- Brain natriuretic peptide ?- T4, free ?- C-reactive protein ?- Sedimentation rate ?- ANA ?- CK ?- MR Brain W Wo Contrast; Future ? ?6. Shortness of breath ?- Anemia Profile B ?- CMP14+EGFR ?- Brain natriuretic peptide ?- MR Brain W Wo Contrast; Future ? ?7. Vision changes ?- MR Brain W Wo Contrast; Future ? ?8. Incontinence of feces, unspecified fecal incontinence type ?- MR Brain W Wo Contrast; Future ? ?9. Tingling ?- Anemia Profile B ?- CMP14+EGFR ?- TSH ?- T4, free ?- MR Brain W Wo Contrast; Future ? ?10. Gait abnormality ?- MR Brain W Wo Contrast; Future ? ?11. Esophageal dysphagia ?- MR Brain W Wo Contrast; Future ? ?12. Allergy to intravenous contrast ?- predniSONE (DELTASONE) 50 MG tablet; Take 50 mg by mouth 13 hours before scan, then 7 hours, and one hour.  Dispense: 3 tablet; Refill: 0 ?- diphenhydrAMINE (BENADRYL ALLERGY) 25 MG tablet; Take 2 tablets (50 mg total) by mouth once for 1 dose.  Dispense: 2 tablet; Refill: 0 ? ?13. Family history of multiple sclerosis ?- MR Brain W Wo Contrast; Future ? ? ?I have spent 50 minutes with patient and her husband discussing her symptoms and work-up.  ? ?Return as directed after labs and imaging. ? ?[name], MSN, APRN, FNP-C ?[facility] ? ?Subjective:  ? ? [name], female    DOB: October 23, 1971, 51 y.o.   MRN: [id number] ? ?Patient Care Team: ?Loman Brooklyn, FNP as PCP - General (Family Medicine)  ? ?Chief Complaint:  ?Chief Complaint  ?Patient presents with  ? Medical Management of Chronic Issues  ? Depression  ?  Been off Cymbalta x 1 year but would like to be put on Wellbutrin. Has been having panic attacks, snapping and feels like she has been more obsessive.    ? ? ?HPI: ?Olivia Zamora is a 51 y.o. female presenting on 04/05/2022 for Medical Management of Chronic Issues and Depression (Been off Cymbalta x 1 year but would like to be put on Wellbutrin. Has been having panic attacks, snapping and feels like she has been more obsessive.  ) ? ?Patient is accompanied by her husband, who she is okay with being present. ? ?Hypertension: patient has been getting elevated readings at home ([id number]/[phone]). Patient reports she has been having tingling in her hands and feet in addition to pain in her neck. She has palpitations and shortness of breath. Yesterday she was started on Losartan 50 mg once daily after sending her blood pressure readings via MyChart.  ? ?Depression: previously weaned herself off Cymbalta as she was unable to afford her medications without insurance. She would like to try Wellbutrin, as this has been effective for her in the past. She feels the depression is due to her current state, which  is explained below. She is angry that she is unable to take care of herself and her family.  ? ?Asthma: patient reports shortness of breath with all activities. She feels the shortness of breath she experiences all the time is different from her asthma shortness of breath. Asthma shortness of breath still improves with Albuterol, the chronic shortness of breath does not.  ? ?Patient last saw her endocrinologist at the end of last year. She is scheduled to see her again next month.  ? ?Patient has had a significant  decline. She has an array of new onset symptoms for which she cannot explain. She feels fatigued all the time. Her body feels heavy; she tries to explain it like she has cement in her legs, or that she is walking through thick mud or in deep water. She feels her movements are slow and resistant. The shortness of breath doesn't let up. She feels like she is full of air, but that she can't breath. Albuterol is not helpful for this shortness of breath. She feels she has had vision changes and her eyes ache. She reports everything around her looks like it is moving in slow motion. Other people speaking sound very slow and she feels she is talking very fast, although she isn't to those around her. She reports feeling brain jog, ringing in her ears, vertigo, nausea, tingling and cold sensations through her body, drooling, tripping and stumbling. She is unable to do basic ADLs and has to have assistance from her husband dressing and getting ready. She has had incontinence of stool. She feels like it is getting harder to swallow so she eats very soft foods. She tries to wash the dishes and can only wash one plate before she has to go sit down from exhaustion. She reports a family history of MS in her sister, mother, and aunt. She also has a sister with Guillain-Barre. ? ? ?Social history: ? ?Relevant past medical, surgical, family and social history reviewed and updated as indicated. Interim medical history since our last visit reviewed. ? ?Allergies and medications reviewed and updated. ? ?DATA REVIEWED: CHART IN EPIC ? ?ROS: Negative unless specifically indicated above in HPI.  ? ? ?Current Outpatient Medications:  ?  albuterol (ACCUNEB) 0.63 MG/3ML nebulizer solution, Take 3 mLs (0.63 mg total) by nebulization every 6 (six) hours as needed for wheezing., Disp: 75 mL, Rfl: 1 ?  aspirin EC 81 MG tablet, Take 81 mg by mouth daily., Disp: , Rfl:  ?  b complex vitamins tablet, Take 1 tablet by mouth daily., Disp: , Rfl:  ?   diphenhydrAMINE (BENADRYL ALLERGY) 25 mg capsule, Take 2 capsules (50 mg total) by mouth once for 1 dose., Disp: 30 capsule, Rfl: 0 ?  docusate sodium (COLACE) 100 MG capsule, Take 100 mg by mouth., Disp: , Rfl:  ?  DULoxetine (CYMBALTA) 30 MG capsule, Take 1 capsule (30 mg total) by mouth 2 (two) times daily. (NEEDS TO BE SEEN BEFORE NEXT REFILL), Disp: 60 capsule, Rfl: 0 ?  lactulose (CHRONULAC) 10 GM/15ML solution, Take 15 mLs (10 g total) by mouth daily as needed for mild constipation., Disp: 473 mL, Rfl: 1 ?  levocetirizine (XYZAL) 5 MG tablet, Take 1 tablet (5 mg total) by mouth every evening., Disp: 90 tablet, Rfl: 2 ?  losartan (COZAAR) 50 MG tablet, Take 1 tablet (50 mg total) by mouth daily., Disp: 30 tablet, Rfl: 0 ?  Multiple Vitamins-Minerals (MULTIVITAMIN ADULT PO), Take 1 tablet by mouth daily., Disp: ,  Rfl:  ?  polyethylene glycol (MIRALAX / GLYCOLAX) 17 g packet, Take 17 g by mouth., Disp: , Rfl:  ?  predniSONE (DELTASONE) 50 MG tablet, Take 50 mg PO 13 hours before scan, then 7 hours, and one hour before scan, Disp: 3 tablet, Rfl: 0 ?  Riboflavin (VITAMIN B-2 PO), Take 1 tablet by mouth daily., Disp: , Rfl:  ?  SYNTHROID 100 MCG tablet, Take 1 tablet by mouth every other day., Disp: , Rfl:  ?  SYNTHROID 112 MCG tablet, Take 112 mcg by mouth every other day., Disp: , Rfl:  ?  temazepam (RESTORIL) 30 MG capsule, Take 1 capsule (30 mg total) by mouth at bedtime., Disp: 30 capsule, Rfl: 5 ?  Vitamin D, Ergocalciferol, (DRISDOL) 1.25 MG (50000 UT) CAPS capsule, Take 50,000 Units by mouth every 7 (seven) days., Disp: , Rfl:   ? ?Allergies  ?Allergen Reactions  ? Iodine Anaphylaxis  ? Iohexol Anaphylaxis  ?  Pre meds given in past with no reaction noted.    ? Latex Anaphylaxis  ? ?Past Medical History:  ?Diagnosis Date  ? Anxiety   ? Failed therapy with Paxil, Lexparo, Prozac, Wellbutrin, Xanax, and Ativan  ? Arthritis   ? Asthma   ? Cardiac disease   ? Angina  ? Chronic back pain   ? Constipation  08/26/2019  ? Depression   ? Failed therapy with Paxil, Lexparo, Prozac, Wellbutrin, and Ativan  ? Disease of thyroid gland 12/28/2011  ? multiple thyroid nodules in 2013 - [place] on 03/08/14 showed heterogeneous appea

## 2022-04-06 ENCOUNTER — Ambulatory Visit (HOSPITAL_COMMUNITY)
Admission: RE | Admit: 2022-04-06 | Discharge: 2022-04-06 | Disposition: A | Discharge status: Home / Self Care | Payer: 59 | Source: Ambulatory Visit | Attending: Family Medicine | Admitting: Family Medicine

## 2022-04-06 DIAGNOSIS — R202 Paresthesia of skin: Secondary | ICD-10-CM | POA: Diagnosis present

## 2022-04-06 DIAGNOSIS — R269 Unspecified abnormalities of gait and mobility: Secondary | ICD-10-CM | POA: Diagnosis present

## 2022-04-06 DIAGNOSIS — H539 Unspecified visual disturbance: Secondary | ICD-10-CM

## 2022-04-06 DIAGNOSIS — M6289 Other specified disorders of muscle: Secondary | ICD-10-CM | POA: Diagnosis present

## 2022-04-06 DIAGNOSIS — Z820 Family history of epilepsy and other diseases of the nervous system: Secondary | ICD-10-CM | POA: Diagnosis present

## 2022-04-06 DIAGNOSIS — R0602 Shortness of breath: Secondary | ICD-10-CM | POA: Diagnosis present

## 2022-04-06 DIAGNOSIS — R159 Full incontinence of feces: Secondary | ICD-10-CM | POA: Diagnosis present

## 2022-04-06 DIAGNOSIS — R1319 Other dysphagia: Secondary | ICD-10-CM

## 2022-04-06 LAB — ANEMIA PROFILE B
Basophils Absolute: 0 10*3/uL (ref 0.0–0.2)
Basos: 0 %
EOS (ABSOLUTE): 0.1 10*3/uL (ref 0.0–0.4)
Eos: 1 %
Ferritin: 329 ng/mL — ABNORMAL HIGH (ref 15–150)
Folate: 12.1 ng/mL (ref >3.0)
Hematocrit: 47 % — ABNORMAL HIGH (ref 34.0–46.6)
Hemoglobin: 16.1 g/dL — ABNORMAL HIGH (ref 11.1–15.9)
Immature Grans (Abs): 0 10*3/uL (ref 0.0–0.1)
Immature Granulocytes: 0 %
Iron Saturation: 46 % (ref 15–55)
Iron: 155 ug/dL (ref 27–159)
Lymphocytes Absolute: 1.7 10*3/uL (ref 0.7–3.1)
Lymphs: 33 %
MCH: 31.7 pg (ref 26.6–33.0)
MCHC: 34.3 g/dL (ref 31.5–35.7)
MCV: 93 fL (ref 79–97)
Monocytes Absolute: 0.4 10*3/uL (ref 0.1–0.9)
Monocytes: 7 %
Neutrophils Absolute: 3.1 10*3/uL (ref 1.4–7.0)
Neutrophils: 59 %
Platelets: 184 10*3/uL (ref 150–450)
RBC: 5.08 x10E6/uL (ref 3.77–5.28)
RDW: 13 % (ref 11.7–15.4)
Retic Ct Pct: 1.9 % (ref 0.6–2.6)
Total Iron Binding Capacity: 335 ug/dL (ref 250–450)
UIBC: 180 ug/dL (ref 131–425)
Vitamin B-12: 319 pg/mL (ref 232–1245)
WBC: 5.3 10*3/uL (ref 3.4–10.8)

## 2022-04-06 LAB — LIPID PANEL
Chol/HDL Ratio: 5.4 ratio — ABNORMAL HIGH (ref 0.0–4.4)
Cholesterol, Total: 212 mg/dL — ABNORMAL HIGH (ref 100–199)
HDL: 39 mg/dL — ABNORMAL LOW (ref >39)
LDL Chol Calc (NIH): 152 mg/dL — ABNORMAL HIGH (ref 0–99)
Triglycerides: 116 mg/dL (ref 0–149)
VLDL Cholesterol Cal: 21 mg/dL (ref 5–40)

## 2022-04-06 LAB — SEDIMENTATION RATE: Sed Rate: 2 mm/hr (ref 0–40)

## 2022-04-06 LAB — CMP14+EGFR
ALT: 39 IU/L — ABNORMAL HIGH (ref 0–32)
AST: 28 IU/L (ref 0–40)
Albumin/Globulin Ratio: 2.4 — ABNORMAL HIGH (ref 1.2–2.2)
Albumin: 4.7 g/dL (ref 3.8–4.9)
Alkaline Phosphatase: 78 IU/L (ref 44–121)
BUN/Creatinine Ratio: 23 (ref 9–23)
BUN: 16 mg/dL (ref 6–24)
Bilirubin Total: 1 mg/dL (ref 0.0–1.2)
CO2: 23 mmol/L (ref 20–29)
Calcium: 10.1 mg/dL (ref 8.7–10.2)
Chloride: 103 mmol/L (ref 96–106)
Creatinine, Ser: 0.71 mg/dL (ref 0.57–1.00)
Globulin, Total: 2 g/dL (ref 1.5–4.5)
Glucose: 109 mg/dL — ABNORMAL HIGH (ref 70–99)
Potassium: 4.7 mmol/L (ref 3.5–5.2)
Sodium: 142 mmol/L (ref 134–144)
Total Protein: 6.7 g/dL (ref 6.0–8.5)
eGFR: 102 mL/min/{1.73_m2} (ref >59)

## 2022-04-06 LAB — TSH: TSH: 0.539 u[IU]/mL (ref 0.450–4.500)

## 2022-04-06 LAB — CK: Total CK: 98 U/L (ref 32–182)

## 2022-04-06 LAB — ANA: Anti Nuclear Antibody (ANA): NEGATIVE

## 2022-04-06 LAB — BRAIN NATRIURETIC PEPTIDE: BNP: 4.3 pg/mL (ref 0.0–100.0)

## 2022-04-06 LAB — C-REACTIVE PROTEIN: CRP: 3 mg/L (ref 0–10)

## 2022-04-06 LAB — T4, FREE: Free T4: 2.2 ng/dL — ABNORMAL HIGH (ref 0.82–1.77)

## 2022-04-06 IMAGING — MR MR HEAD WO/W CM
15 of 16 series · 38 of 48 positions shown · IV contrast (Gadavist)
Comparison: Head CT [DATE].

CLINICAL DATA: 51-year-old female with muscle fatigue, vision
changes, tingling sensation, abnormal gait, swallowing difficulty.
Family history of multiple sclerosis.

EXAM:
MRI HEAD WITHOUT AND WITH CONTRAST
TECHNIQUE: Multiplanar, multiecho pulse sequences of the brain and surrounding
structures were obtained without and with intravenous contrast.
CONTRAST:  9mL GADAVIST GADOBUTROL 1 MMOL/ML IV SOLN

[Series 5: DWI · axial · 3.0mm · 0.77mm/px · z∈[-55,+91]mm · 2 of 50 slices shown (1 of 4)]
[im 1/50]
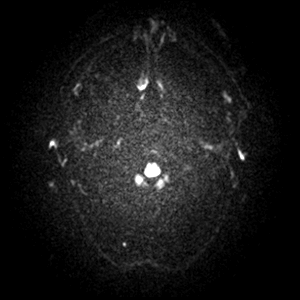
[im 50/50]
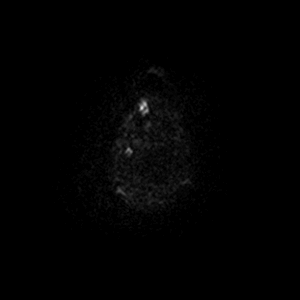

[Series 6: DWI · axial · 3.0mm · 0.77mm/px · z∈[-55,+91]mm · 2 of 50 slices shown (2 of 4)]
[im 1/50]
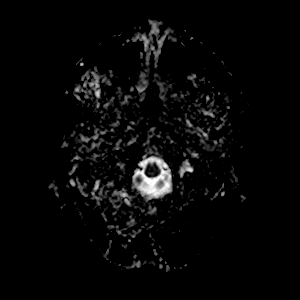
[im 50/50]
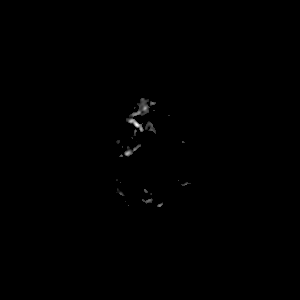

[Series 7: DWI · coronal · 5.0mm · 0.88mm/px · 2 of 28 slices shown (3 of 4)]
[im 1/28]
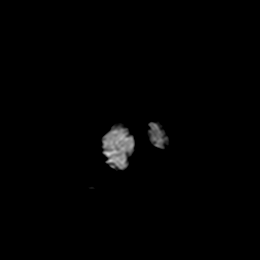
[im 28/28]
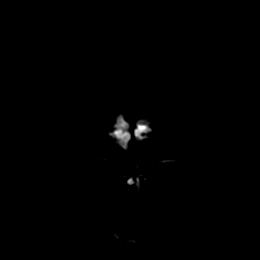

[Series 8: DWI · coronal · 5.0mm · 0.88mm/px · 2 of 28 slices shown (4 of 4)]
[im 1/28]
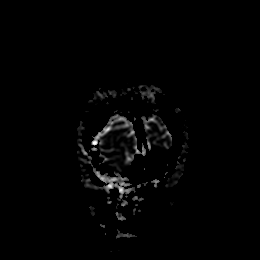
[im 28/28]
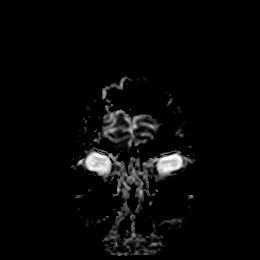

[Series 9: T1 · sagittal · 5.0mm · 0.75mm/px · 1 of 21 slices shown (1 of 2)]
[im 1/21]
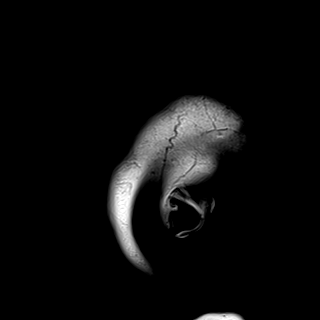

[Series 10: FLAIR · sagittal · 5.0mm · 0.94mm/px · 1 of 21 slices shown (1 of 2)]
[im 1/21]
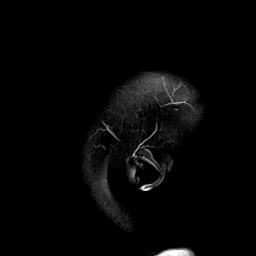

[Series 11: T2 · axial · 5.0mm · 0.72mm/px · 1 of 23 slices shown (1 of 2)]
[im 1/23]
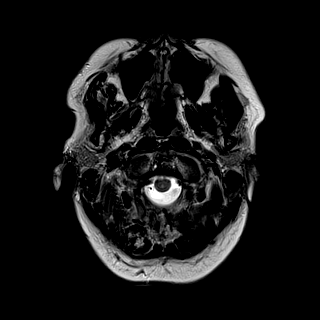

[Series 12: mag_images · axial · 3.0mm · 0.90mm/px · z∈[-70,+106]mm · 3 of 60 slices shown]
[im 1/60]
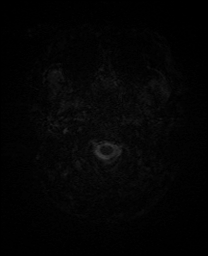
[im 30/60]
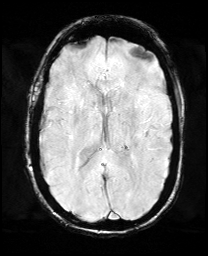
[im 60/60]
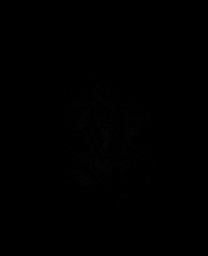

[Series 13: pha_images · axial · 3.0mm · 0.90mm/px · z∈[-70,+100]mm · 3 of 58 slices shown]
[im 1/58]
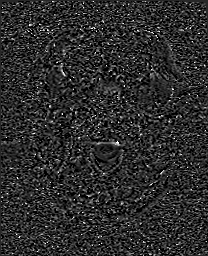
[im 29/58]
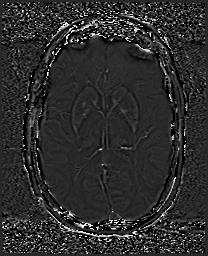
[im 58/58]
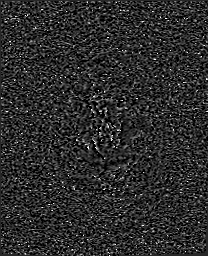

[Series 14: swi_images · axial · 3.0mm · 0.90mm/px · z∈[-70,+106]mm · 3 of 60 slices shown]
[im 1/60]
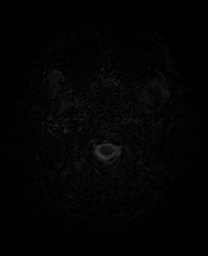
[im 30/60]
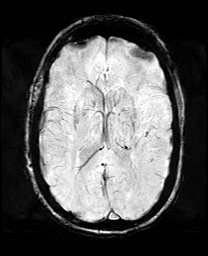
[im 60/60]
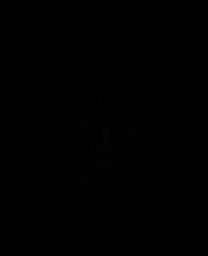

[Series 16: FLAIR · axial · 3.0mm · 0.45mm/px · z∈[-55,+91]mm · 3 of 50 slices shown (2 of 2)]
[im 1/50]
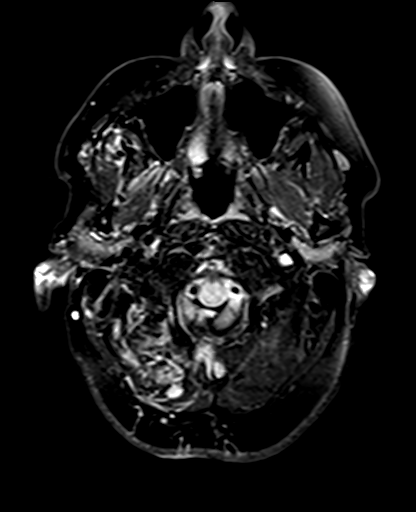
[im 25/50]
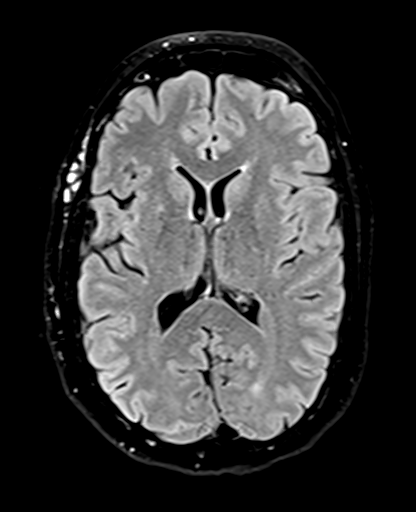
[im 50/50]
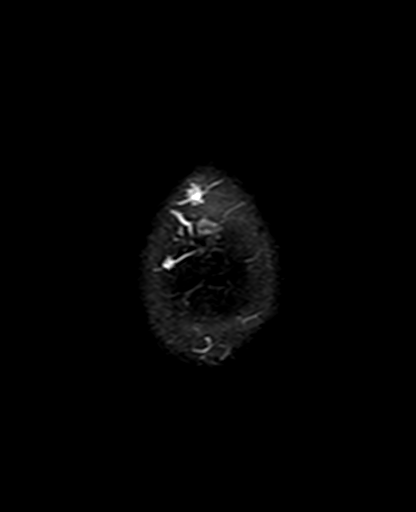

[Series 17: T1 · axial · 1.0mm · 0.98mm/px · z∈[-68,+106]mm · 10 of 176 slices shown (2 of 2)]
[im 1/176]
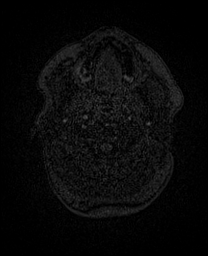
[im 20/176]
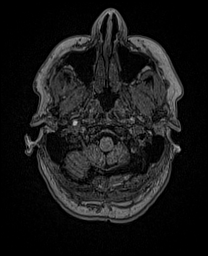
[im 39/176]
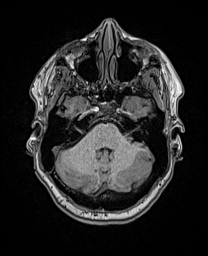
[im 59/176]
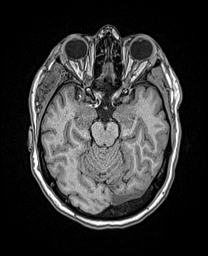
[im 78/176]
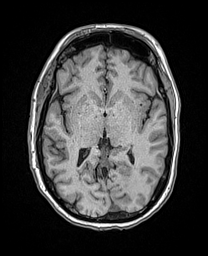
[im 98/176]
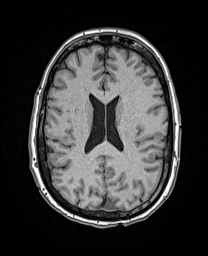
[im 117/176]
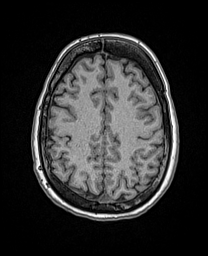
[im 137/176]
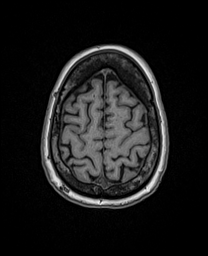
[im 156/176]
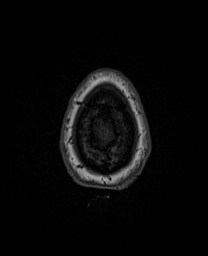
[im 176/176]
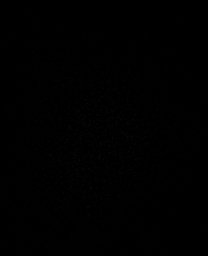

[Series 18: T2 · coronal · 5.0mm · 0.72mm/px · 2 of 28 slices shown (2 of 2)]
[im 1/28]
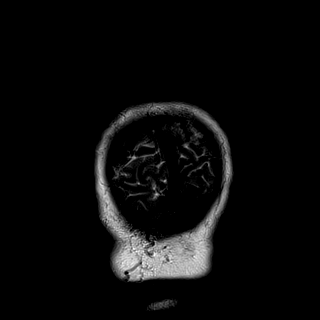
[im 28/28]
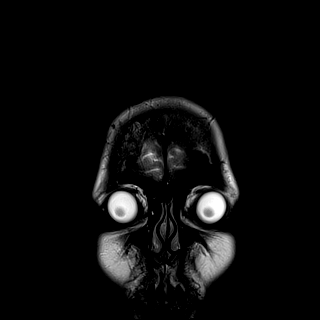

[Series 20: T1 post-contrast · coronal · 5.0mm · 0.34mm/px · 2 of 28 slices shown (1 of 2)]
[im 1/28]
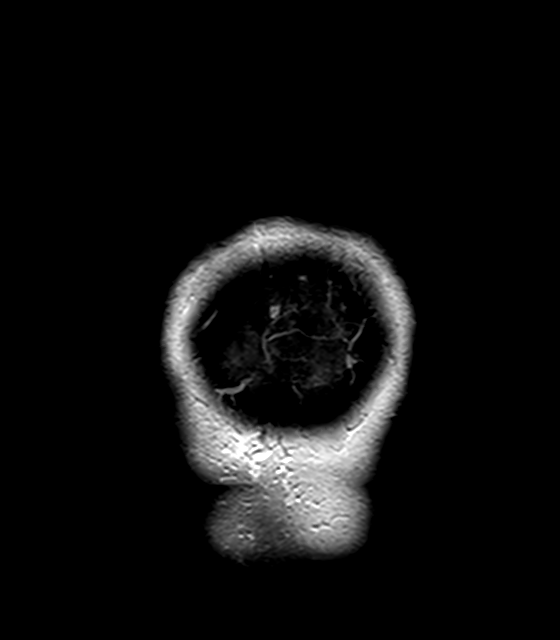
[im 28/28]
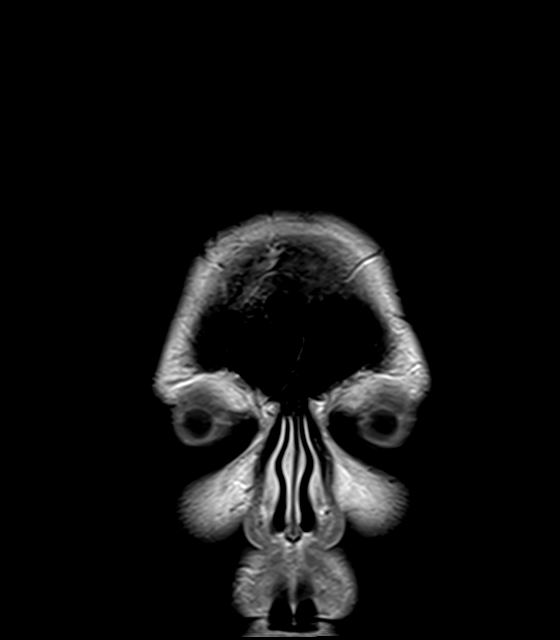

[Series 21: T1 post-contrast · sagittal · 5.0mm · 0.75mm/px · 1 of 21 slices shown (2 of 2)]
[im 1/21]
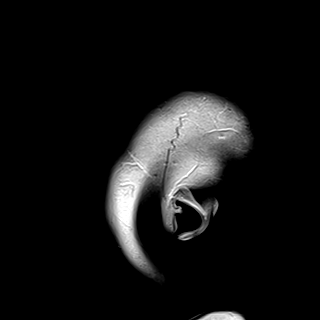

[38 of 48 positions shown; findings below may reference images not displayed]

FINDINGS: Brain: Cerebral volume is within normal limits for age. No
restricted diffusion to suggest acute infarction. No midline shift,
mass effect, evidence of mass lesion, ventriculomegaly, extra-axial
collection or acute intracranial hemorrhage. Cervicomedullary
junction and pituitary are within normal limits.

Gray and white matter signal is within normal limits for age
throughout the brain.

Incidental multiple developmental venous anomalies are noted (normal
variation) including in the left hemisphere on series 20, image 12,
right hemisphere image 17, dorsal brainstem series 19, image 49, and
right cerebellum series 20 image 11. No associated cavernous venous
malformations of the brain. No chronic cerebral blood products or
cortical encephalomalacia identified. Deep gray matter nuclei,
brainstem are within normal limits.

No abnormal enhancement identified.  No dural thickening identified.

Vascular: Major intracranial vascular flow voids are preserved. The
major dural venous sinuses are enhancing and appear to be patent.

Skull and upper cervical spine: Negative visible cervical spine.
Visualized bone marrow signal is within normal limits.

Sinuses/Orbits: Negative orbits.  Paranasal sinuses are clear.

Other: Mastoids are clear. Visible internal auditory structures
appear normal.

There is an intramuscular vascular malformation of the temporalis
muscle on the right (series 18, image 21 and series 16, images 15
through 22) with numerous enhancing vessels (series 19, image 69)
which seem to be communicating with an increased number of calvarium
vessels in that region also. No definite scalp edema. Negative
visible face soft tissues.
IMPRESSION: 1. No acute intracranial abnormality. Normal for age non contrast
MRI appearance of the brain. No evidence of demyelinating disease.

2. Vascular Malformation within the Right Temporalis Muscle,
possibly a high-flow AVM or alternatively a slow flow venous
malformation.
Also incidentally there are multiple developmental venous anomalies
of the brain, but those are unrelated normal anatomic variation.

## 2022-04-06 MED ORDER — GADOBUTROL 1 MMOL/ML IV SOLN
9.0000 mL | Freq: Once | INTRAVENOUS | Status: AC | PRN
  Administered 2022-04-06: 9 mL via INTRAVENOUS

## 2022-04-09 ENCOUNTER — Encounter: Payer: Self-pay | Admitting: Family Medicine

## 2022-04-09 DIAGNOSIS — Q283 Other malformations of cerebral vessels: Secondary | ICD-10-CM

## 2022-04-10 MED ORDER — PREDNISONE 20 MG PO TABS: 40.0000 mg | ORAL_TABLET | Freq: Every day | ORAL | 0 refills | Status: AC

## 2022-04-12 ENCOUNTER — Ambulatory Visit: Payer: 59 | Admitting: Family Medicine

## 2022-04-20 ENCOUNTER — Encounter: Payer: Self-pay | Admitting: Family Medicine

## 2022-04-20 ENCOUNTER — Telehealth: Payer: Self-pay | Admitting: Family Medicine

## 2022-04-20 DIAGNOSIS — Q283 Other malformations of cerebral vessels: Secondary | ICD-10-CM

## 2022-04-20 NOTE — Telephone Encounter (Signed)
I received the following message from [facility] regarding a referral placed to vascular surgery: ?Thank you for considering our office for this referral however this referral is most appropriate for the [facility] system per one of our surgeons. He stated that they have a vascular malformation clinic. I will give your office a call and close this referral. Please give our office a call if you have additional questions. Thank you  ? ?Therefore I am replacing referral to [facility]. Please make patient aware. ?

## 2022-04-23 NOTE — Telephone Encounter (Signed)
I left detailed message about the change in referral  ?

## 2022-04-24 NOTE — Telephone Encounter (Signed)
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 Can they address both the knee and the brain? ?

## 2022-04-25 ENCOUNTER — Ambulatory Visit: Payer: 59 | Admitting: Family Medicine

## 2022-04-25 ENCOUNTER — Encounter: Payer: Self-pay | Admitting: Family Medicine

## 2022-04-25 VITALS — BP 114/78 | HR 85 | Temp 96.8°F | Ht 66.0 in | Wt 206.0 lb

## 2022-04-25 DIAGNOSIS — I10 Essential (primary) hypertension: Secondary | ICD-10-CM | POA: Diagnosis not present

## 2022-04-25 DIAGNOSIS — H539 Unspecified visual disturbance: Secondary | ICD-10-CM

## 2022-04-25 DIAGNOSIS — R202 Paresthesia of skin: Secondary | ICD-10-CM

## 2022-04-25 DIAGNOSIS — R269 Unspecified abnormalities of gait and mobility: Secondary | ICD-10-CM

## 2022-04-25 DIAGNOSIS — R5383 Other fatigue: Secondary | ICD-10-CM

## 2022-04-25 DIAGNOSIS — F332 Major depressive disorder, recurrent severe without psychotic features: Secondary | ICD-10-CM

## 2022-04-25 DIAGNOSIS — M6289 Other specified disorders of muscle: Secondary | ICD-10-CM

## 2022-04-25 DIAGNOSIS — R0602 Shortness of breath: Secondary | ICD-10-CM

## 2022-04-25 MED ORDER — PREDNISONE 5 MG PO TABS: 5.0000 mg | ORAL_TABLET | Freq: Every day | ORAL | 0 refills | Status: DC

## 2022-04-25 NOTE — Progress Notes (Signed)
Assessment & Plan:  1. Essential hypertension Well controlled on current regimen.   2. Severe episode of recurrent major depressive disorder, without psychotic features ([place]) Improving. Continue Wellbutrin.  3-8. Muscle fatigue/Exhaustion/Shortness of breath/Vision changes/Tingling/Gait abnormality Referring to neurology to assist with diagnosis.  Symptoms do improve with steroids.  Patient has significant family history of neurological disease including multiple sisters with MS and Ethelene Hal. - Ambulatory referral to Neurology   Return Patient to message me with an update on lower dose of prednisone.  [name], MSN, APRN, FNP-C [facility]  Subjective:    [name], female    DOB: [birth year], 51 y.o.   MRN: [id number]  Patient Care Team: Loman Brooklyn, FNP as PCP - General (Family Medicine)   Chief Complaint:  Chief Complaint  Patient presents with   muscle weakness    Patient was supposed to take 10 days of prednisone but only took it for 4 days due to side effects- not sleeping, sweating and heart racing. Patient states that the prednisone also helped with migraines and improved her vision.     HPI: Olivia Zamora is a 51 y.o. female presenting on 04/25/2022 for muscle weakness (Patient was supposed to take 10 days of prednisone but only took it for 4 days due to side effects- not sleeping, sweating and heart racing. Patient states that the prednisone also helped with migraines and improved her vision. )  Patient is accompanied by her husband who she is okay with being present.  She is here to follow-up on symptoms after being prescribed prednisone x10 days.  She initially presented experiencing muscle fatigue, exhaustion, shortness of breath, vision changes, tingling, and gait abnormality.  Brain MRI showed a vascular malformation for which she has been referred to a [facility] vascular clinic.  She reports she was only able to  tolerate 4 days of the prednisone due to side effects of severe sweating, inability to sleep, heart racing, and irritability.  She reports some improvement in her symptoms that she has been experiencing, however she does feel they have worsened since stopping the prednisone, though not to the extent they were when she started it.  Hypertension: Patient has been monitoring her blood pressure at home and reports she is now getting good readings.  Depression: Started on Wellbutrin at her last visit.  States she is feeling much better from this perspective.     04/25/2022    4:29 PM 12/28/2020    2:46 PM 08/05/2020    3:54 PM  Depression screen PHQ 2/9  Decreased Interest 2 0 3  Down, Depressed, Hopeless 1 0 2  PHQ - 2 Score 3 0 5  Altered sleeping 2 0 3  Tired, decreased energy _0 Change in appetite 1 0 3  Feeling bad or failure about yourself  1 0 1  Trouble concentrating 0 0 2  Moving slowly or fidgety/restless 1 0 2  Suicidal thoughts 0 0 0  PHQ-9 Score _1 Difficult doing work/chores Somewhat difficult Not difficult at all     New complaints: None   Social history:  Relevant past medical, surgical, family and social history reviewed and updated as indicated. Interim medical history since our last visit reviewed.  Allergies and medications reviewed and updated.  DATA REVIEWED: CHART IN EPIC  ROS: Negative unless specifically indicated above in HPI.    Current Outpatient Medications:    albuterol (ACCUNEB) 0.63 MG/3ML nebulizer solution, Take 3  mLs (0.63 mg total) by nebulization every 6 (six) hours as needed for wheezing., Disp: 75 mL, Rfl: 1   aspirin EC 81 MG tablet, Take 81 mg by mouth daily., Disp: , Rfl:    buPROPion (WELLBUTRIN XL) 150 MG 24 hr tablet, Take 1 tablet (150 mg total) by mouth daily., Disp: 30 tablet, Rfl: 2   docusate sodium (COLACE) 100 MG capsule, Take 100 mg by mouth., Disp: , Rfl:    losartan (COZAAR) 50 MG tablet, Take 1 tablet (50 mg  total) by mouth daily., Disp: 30 tablet, Rfl: 0   Multiple Vitamins-Minerals (MULTIVITAMIN ADULT PO), Take 1 tablet by mouth daily., Disp: , Rfl:    SYNTHROID 100 MCG tablet, Take 1 tablet by mouth every other day., Disp: , Rfl:    SYNTHROID 112 MCG tablet, Take 112 mcg by mouth every other day., Disp: , Rfl:    Vitamin D, Ergocalciferol, (DRISDOL) 1.25 MG (50000 UT) CAPS capsule, Take 50,000 Units by mouth every 7 (seven) days., Disp: , Rfl:    diphenhydrAMINE (BENADRYL ALLERGY) 25 MG tablet, Take 2 tablets (50 mg total) by mouth once for 1 dose. (Patient not taking: Reported on 04/25/2022), Disp: 2 tablet, Rfl: 0   Allergies  Allergen Reactions   Iodine Anaphylaxis   Iohexol Anaphylaxis    Pre meds given in past with no reaction noted.     Latex Anaphylaxis   Past Medical History:  Diagnosis Date   Anxiety    Failed therapy with Paxil, Lexparo, Prozac, Wellbutrin, Xanax, and Ativan   Arthritis    Asthma    Cardiac disease    Angina   Chronic back pain    Constipation 08/26/2019   Depression    Failed therapy with Paxil, Lexparo, Prozac, Wellbutrin, and Ativan   Disease of thyroid gland 12/28/2011   multiple thyroid nodules in 2013 - [place] on 03/08/14 showed heterogeneous appearance of thyroid gland without focal nodule   Eczema    Generalized seizure ([place])    stress related   GERD (gastroesophageal reflux disease)    History of DVT (deep vein thrombosis) 1988   Hypertension    IBS (irritable bowel syndrome)    Internal derangement of left knee    Lactose intolerance    Migraine    Sleep apnea    wears CPAP   Vitamin D deficiency     Past Surgical History:  Procedure Laterality Date   CESAREAN SECTION     x3   COLONOSCOPY     HEMANGIOMA EXCISION Right    shoulder   IR RADIOLOGIST EVAL & MGMT  05/04/2021   MASS EXCISION Right    lump from palm of hand   OOPHORECTOMY     OTHER SURGICAL HISTORY Right    Repair of tib-fib fracture   TOTAL ABDOMINAL HYSTERECTOMY     TUBAL  LIGATION     TUMOR EXCISION Right    Axilla - benign   VASCULAR SURGERY Right 1989   blood clot removed from neck    Social History   Socioeconomic History   Marital status: Married    Spouse name: Not on file   Number of children: Not on file   Years of education: Not on file   Highest education level: Not on file  Occupational History   Not on file  Tobacco Use   Smoking status: Former    Types: Cigarettes   Smokeless tobacco: Never  Substance and Sexual Activity   Alcohol use: Never  Drug use: Never   Sexual activity: Not on file  Other Topics Concern   Not on file  Social History Narrative   Not on file   Social Determinants of Health   Financial Resource Strain: Not on file  Food Insecurity: Not on file  Transportation Needs: Not on file  Physical Activity: Not on file  Stress: Not on file  Social Connections: Not on file  Intimate Partner Violence: Not on file        Objective:    BP 114/78   Pulse 85   Temp (!) 96.8 F (36 C) (Temporal)   Ht _0  (1.676 m)   Wt 206 lb (93.4 kg)   SpO2 94%   BMI 33.25 kg/m   Wt Readings from Last 3 Encounters:  04/25/22 206 lb (93.4 kg)  04/05/22 204 lb 9.6 oz (92.8 kg)  08/01/21 206 lb 6.4 oz (93.6 kg)    Physical Exam Vitals reviewed.  Constitutional:      General: She is not in acute distress.    Appearance: Normal appearance. She is obese. She is not ill-appearing, toxic-appearing or diaphoretic.  HENT:     Head: Normocephalic and atraumatic.  Eyes:     General: No scleral icterus.       Right eye: No discharge.        Left eye: No discharge.     Conjunctiva/sclera: Conjunctivae normal.  Cardiovascular:     Rate and Rhythm: Normal rate and regular rhythm.     Heart sounds: Normal heart sounds. No murmur heard.   No friction rub. No gallop.  Pulmonary:     Effort: Pulmonary effort is normal. No respiratory distress.     Breath sounds: Normal breath sounds. No stridor. No wheezing, rhonchi or  rales.  Musculoskeletal:        General: Normal range of motion.     Cervical back: Normal range of motion.  Skin:    General: Skin is warm and dry.     Capillary Refill: Capillary refill takes less than 2 seconds.  Neurological:     General: No focal deficit present.     Mental Status: She is alert and oriented to person, place, and time. Mental status is at baseline.     Motor: Weakness present.     Gait: Gait abnormal.  Psychiatric:        Mood and Affect: Mood normal.        Behavior: Behavior normal.        Thought Content: Thought content normal.        Judgment: Judgment normal.    Lab Results  Component Value Date   TSH 0.539 04/05/2022   Lab Results  Component Value Date   WBC 5.3 04/05/2022   HGB 16.1 (H) 04/05/2022   HCT 47.0 (H) 04/05/2022   MCV 93 04/05/2022   PLT 184 04/05/2022   Lab Results  Component Value Date   NA 142 04/05/2022   K 4.7 04/05/2022   CO2 23 04/05/2022   GLUCOSE 109 (H) 04/05/2022   BUN 16 04/05/2022   CREATININE 0.71 04/05/2022   BILITOT 1.0 04/05/2022   ALKPHOS 78 04/05/2022   AST 28 04/05/2022   ALT 39 (H) 04/05/2022   PROT 6.7 04/05/2022   ALBUMIN 4.7 04/05/2022   CALCIUM 10.1 04/05/2022   ANIONGAP 8 07/29/2021   EGFR 102 04/05/2022   Lab Results  Component Value Date   CHOL 212 (H) 04/05/2022   Lab Results  Component  Value Date   HDL 39 (L) 04/05/2022   Lab Results  Component Value Date   LDLCALC 152 (H) 04/05/2022   Lab Results  Component Value Date   TRIG 116 04/05/2022   Lab Results  Component Value Date   CHOLHDL 5.4 (H) 04/05/2022   No results found for: HGBA1C

## 2022-04-29 ENCOUNTER — Encounter: Payer: Self-pay | Admitting: Family Medicine

## 2022-05-02 ENCOUNTER — Other Ambulatory Visit: Payer: Self-pay | Admitting: Family Medicine

## 2022-05-02 DIAGNOSIS — I10 Essential (primary) hypertension: Secondary | ICD-10-CM

## 2022-05-02 MED ORDER — LOSARTAN POTASSIUM 50 MG PO TABS: 50.0000 mg | ORAL_TABLET | Freq: Every day | ORAL | 0 refills | Status: DC

## 2022-05-03 ENCOUNTER — Encounter: Payer: Self-pay | Admitting: Family Medicine

## 2022-05-16 ENCOUNTER — Ambulatory Visit: Payer: 59 | Admitting: Family Medicine

## 2022-05-21 ENCOUNTER — Encounter: Payer: Self-pay | Admitting: Family Medicine

## 2022-05-21 ENCOUNTER — Other Ambulatory Visit: Payer: 59

## 2022-05-30 ENCOUNTER — Other Ambulatory Visit: Payer: Self-pay | Admitting: Family Medicine

## 2022-05-30 DIAGNOSIS — I10 Essential (primary) hypertension: Secondary | ICD-10-CM

## 2022-05-30 MED ORDER — LOSARTAN POTASSIUM 50 MG PO TABS: 50.0000 mg | ORAL_TABLET | Freq: Every day | ORAL | 1 refills | Status: DC

## 2022-06-08 ENCOUNTER — Encounter: Payer: Self-pay | Admitting: Family Medicine

## 2022-06-13 NOTE — Patient Instructions (Signed)
Our records indicate that you are due for your annual mammogram/breast imaging. While there is no way to prevent breast cancer, early detection provides the best opportunity for curing it. For women over the age of 40, the American Cancer Society recommends a yearly clinical breast exam and a yearly mammogram. These practices have saved thousands of lives. We need your help to ensure that you are receiving optimal medical care. Please call the imaging location that has done you previous mammograms. Please remember to list us as your primary care. This helps make sure we receive a report and can update your chart.  Below is the contact information for several local breast imaging centers. You may call the location that works best for you, and they will be happy to assistance in making you an appointment. You do not need an order for a regular screening mammogram. However, if you are having any problems or concerns with you breast area, please let your primary care provider know, and appropriate orders will be placed. Please let our office know if you have any questions or concerns. Or if you need information for another imaging center not on this list or outside of the area. We are commented to working with you on your health care journey.   The mobile unit/bus ([facility]) - they come twice a month to our location.  These appointments can be made through our office or by call The Breast Center  [facility]  [address] [place], [place] [address] Phone [phone]  [facility] Radiology Department  [address]  [place], [place] [address] [phone]  [facility])  [address] [place], [place] [address] [phone]  [facility] [place]  [address] [place] [address] [phone]  [facility]  [address], [address] [place] [address] [phone]  [facility] [place]  [address] , [place] [address] [phone]  [facility] [address] [place], [place] [address] [phone]  [facility] [address]  [address] [place], [place] [address] [phone]  [facility] [address] [place] [address] [phone]     

## 2022-06-15 ENCOUNTER — Encounter: Payer: Self-pay | Admitting: Family Medicine

## 2022-06-20 ENCOUNTER — Ambulatory Visit: Payer: 59 | Admitting: Family Medicine

## 2022-06-20 VITALS — BP 125/86 | HR 94 | Temp 96.5°F | Ht 66.0 in | Wt 203.2 lb

## 2022-06-20 DIAGNOSIS — Q2730 Arteriovenous malformation, site unspecified: Secondary | ICD-10-CM

## 2022-06-20 DIAGNOSIS — Z79899 Other long term (current) drug therapy: Secondary | ICD-10-CM

## 2022-06-20 DIAGNOSIS — I10 Essential (primary) hypertension: Secondary | ICD-10-CM | POA: Diagnosis not present

## 2022-06-20 DIAGNOSIS — F332 Major depressive disorder, recurrent severe without psychotic features: Secondary | ICD-10-CM | POA: Diagnosis not present

## 2022-06-20 DIAGNOSIS — E782 Mixed hyperlipidemia: Secondary | ICD-10-CM | POA: Diagnosis not present

## 2022-06-20 DIAGNOSIS — R300 Dysuria: Secondary | ICD-10-CM

## 2022-06-20 DIAGNOSIS — F5101 Primary insomnia: Secondary | ICD-10-CM

## 2022-06-20 LAB — URINALYSIS, ROUTINE W REFLEX MICROSCOPIC
Bilirubin, UA: NEGATIVE
Glucose, UA: NEGATIVE
Ketones, UA: NEGATIVE
Leukocytes,UA: NEGATIVE
Nitrite, UA: NEGATIVE
Protein,UA: NEGATIVE
RBC, UA: NEGATIVE
Specific Gravity, UA: 1.025 (ref 1.005–1.030)
Urobilinogen, Ur: 0.2 mg/dL (ref 0.2–1.0)
pH, UA: 5 (ref 5.0–7.5)

## 2022-06-20 MED ORDER — BUPROPION HCL ER (XL) 300 MG PO TB24: 300.0000 mg | ORAL_TABLET | Freq: Every day | ORAL | 1 refills | Status: DC

## 2022-06-20 MED ORDER — DOCUSATE SODIUM 100 MG PO CAPS: 100.0000 mg | ORAL_CAPSULE | Freq: Every day | ORAL | 1 refills | Status: AC

## 2022-06-20 MED ORDER — LOSARTAN POTASSIUM 50 MG PO TABS: 50.0000 mg | ORAL_TABLET | Freq: Every day | ORAL | 1 refills | Status: DC

## 2022-06-20 MED ORDER — ZOLPIDEM TARTRATE 5 MG PO TABS: 5.0000 mg | ORAL_TABLET | Freq: Every evening | ORAL | 5 refills | Status: DC | PRN

## 2022-06-20 MED ORDER — ALBUTEROL SULFATE 0.63 MG/3ML IN NEBU: 1.0000 | INHALATION_SOLUTION | Freq: Four times a day (QID) | RESPIRATORY_TRACT | 1 refills | Status: DC | PRN

## 2022-06-20 NOTE — Progress Notes (Signed)
Assessment & Plan:  1. Essential hypertension Well controlled on current regimen.  - losartan (COZAAR) 50 MG tablet; Take 1 tablet (50 mg total) by mouth daily.  Dispense: 90 tablet; Refill: 1  2. Mixed hyperlipidemia Medication not yet indicated.  3. Severe episode of recurrent major depressive disorder, without psychotic features ([place]) Improving. Wellbutrin increased from 150 mg to 300 mg daily. - buPROPion (WELLBUTRIN XL) 300 MG 24 hr tablet; Take 1 tablet (300 mg total) by mouth daily.  Dispense: 90 tablet; Refill: 1  4. AVM (arteriovenous malformation) Managed by [facility].  5. Primary insomnia Restarting Ambien.  6. Controlled substance agreement signed Controlled substance agreement signed today for Ambien. I forgot to collect a urine drug screen. PDMP reviewed with no concerning findings.  - zolpidem (AMBIEN) 5 MG tablet; Take 1 tablet (5 mg total) by mouth at bedtime as needed for sleep.  Dispense: 30 tablet; Refill: 5  7. Dysuria Negative UA. - Urinalysis, Routine w reflex microscopic   Return when you have insurance again, for follow-up of chronic medication conditions.  [name], MSN, APRN, FNP-C [facility]  Subjective:    [name], female    DOB: [birth year], 51 y.o.   MRN: [id number]  Patient Care Team: Loman Brooklyn, FNP as PCP - General (Family Medicine)   Chief Complaint:  Chief Complaint  Patient presents with   Medical Management of Chronic Issues   Depression    Would like to increase her Wellbutrin    Dysuria    X 2 weeks    buzzing in right ear    Ongoing buzzing but the pain has gotten worse.     HPI: Olivia Zamora is a 51 y.o. female presenting on 06/20/2022 for Medical Management of Chronic Issues, Depression (Would like to increase her Wellbutrin ), Dysuria (X 2 weeks ), and buzzing in right ear (Ongoing buzzing but the pain has gotten worse. )  Patient is accompanied by her husband, who she is  okay with being present.   Hypertension: Patient has been monitoring her blood pressure at home and reports she is now getting good readings.   Depression: Previously started on Wellbutrin and states she is feeling much better from this perspective, but would like to increase the dosage.     06/20/2022    5:04 PM 04/25/2022    4:29 PM 12/28/2020    2:46 PM  Depression screen PHQ 2/9  Decreased Interest 3 2 0  Down, Depressed, Hopeless 2 1 0  PHQ - 2 Score 5 3 0  Altered sleeping 3 2 0  Tired, decreased energy $RemoveBeforeDE'3 2 1  'ZBjbawvyigIJXto$ Change in appetite 2 1 0  Feeling bad or failure about yourself  3 1 0  Trouble concentrating 3 0 0  Moving slowly or fidgety/restless 3 1 0  Suicidal thoughts 1 0 0  PHQ-9 Score $RemoveBef'23 10 1  'MztMrcAUdk$ Difficult doing work/chores Very difficult Somewhat difficult Not difficult at all      06/20/2022    5:04 PM 04/25/2022    4:29 PM 12/28/2020    2:46 PM 08/05/2020    3:54 PM  GAD 7 : Generalized Anxiety Score  Nervous, Anxious, on Edge 3 1 0 3  Control/stop worrying 2 0 0 0  Worry too much - different things 2 0 0 2  Trouble relaxing 3 0 1 2  Restless 2 0 0 2  Easily annoyed or irritable 3 0 0 3  Afraid - awful might  happen 0 0 0 2  Total GAD 7 Score $Remov'15 1 1 14  'bkZdUB$ Anxiety Difficulty Somewhat difficult Somewhat difficult Not difficult at all     Multiple AVMs: now being managed by an AVM clinic at [facility]. She is having to save up money for further studies as they require payment up front.  Hyperlipidemia: medication not yet indicated.   The 10-year ASCVD risk score (Arnett DK, et al., 2019) is: 3.2%   Values used to calculate the score:     Age: 21 years     Sex: Female     Is Non-Hispanic African American: No     Diabetic: No     Tobacco smoker: No     Systolic Blood Pressure: 007 mmHg     Is BP treated: Yes     HDL Cholesterol: 39 mg/dL     Total Cholesterol: 212 mg/dL   New complaints: Patient is about to lose her insurance. She reports a history of interstitial  cystitis and just wants to make sure her dysuria is related to this and that she does not have a current infection before she does not have insurance again.  She would like her right ear looked at due to tinnitus, a zapping sensation, and feeling heavy. She feels this may be related to the AVM of her brain, but wants to make sure.   She would like something to help with sleep. She has previously failed treatment with OTC herbs, natural remedies, lotions, essential oils, and Restoril. She was previously sleeping with Ambien and only stopped it because she ran out.   Social history:  Relevant past medical, surgical, family and social history reviewed and updated as indicated. Interim medical history since our last visit reviewed.  Allergies and medications reviewed and updated.  DATA REVIEWED: CHART IN EPIC  ROS: Negative unless specifically indicated above in HPI.    Current Outpatient Medications:    albuterol (ACCUNEB) 0.63 MG/3ML nebulizer solution, Take 3 mLs (0.63 mg total) by nebulization every 6 (six) hours as needed for wheezing., Disp: 75 mL, Rfl: 1   aspirin EC 81 MG tablet, Take 81 mg by mouth daily., Disp: , Rfl:    buPROPion (WELLBUTRIN XL) 150 MG 24 hr tablet, Take 1 tablet (150 mg total) by mouth daily., Disp: 30 tablet, Rfl: 2   docusate sodium (COLACE) 100 MG capsule, Take 100 mg by mouth., Disp: , Rfl:    losartan (COZAAR) 50 MG tablet, Take 1 tablet (50 mg total) by mouth daily., Disp: 90 tablet, Rfl: 1   Multiple Vitamins-Minerals (MULTIVITAMIN ADULT PO), Take 1 tablet by mouth daily., Disp: , Rfl:    SYNTHROID 100 MCG tablet, Take 1 tablet by mouth every other day., Disp: , Rfl:    SYNTHROID 112 MCG tablet, Take 112 mcg by mouth every other day., Disp: , Rfl:    Vitamin D, Ergocalciferol, (DRISDOL) 1.25 MG (50000 UT) CAPS capsule, Take 50,000 Units by mouth every 7 (seven) days., Disp: , Rfl:    Allergies  Allergen Reactions   Iodine Anaphylaxis   Iohexol  Anaphylaxis    Pre meds given in past with no reaction noted.     Latex Anaphylaxis   Topamax [Topiramate] Anaphylaxis   Past Medical History:  Diagnosis Date   Anxiety    Failed therapy with Paxil, Lexparo, Prozac, Wellbutrin, Xanax, and Ativan   Arthritis    Asthma    Cardiac disease    Angina   Chronic back pain  Constipation 08/26/2019   Depression    Failed therapy with Paxil, Lexparo, Prozac, Wellbutrin, and Ativan   Disease of thyroid gland 12/28/2011   multiple thyroid nodules in 2013 - [place] on 03/08/14 showed heterogeneous appearance of thyroid gland without focal nodule   Eczema    Generalized seizure ([place])    stress related   GERD (gastroesophageal reflux disease)    History of DVT (deep vein thrombosis) 1988   Hypertension    IBS (irritable bowel syndrome)    Internal derangement of left knee    Lactose intolerance    Migraine    Sleep apnea    wears CPAP   Vitamin D deficiency     Past Surgical History:  Procedure Laterality Date   CESAREAN SECTION     x3   COLONOSCOPY     HEMANGIOMA EXCISION Right    shoulder   IR RADIOLOGIST EVAL & MGMT  05/04/2021   MASS EXCISION Right    lump from palm of hand   OOPHORECTOMY     OTHER SURGICAL HISTORY Right    Repair of tib-fib fracture   TOTAL ABDOMINAL HYSTERECTOMY     TUBAL LIGATION     TUMOR EXCISION Right    Axilla - benign   VASCULAR SURGERY Right 1989   blood clot removed from neck    Social History   Socioeconomic History   Marital status: Married    Spouse name: Not on file   Number of children: Not on file   Years of education: Not on file   Highest education level: Not on file  Occupational History   Not on file  Tobacco Use   Smoking status: Former    Types: Cigarettes   Smokeless tobacco: Never  Substance and Sexual Activity   Alcohol use: Never   Drug use: Never   Sexual activity: Not on file  Other Topics Concern   Not on file  Social History Narrative   Not on file   Social  Determinants of Health   Financial Resource Strain: Not on file  Food Insecurity: Not on file  Transportation Needs: Not on file  Physical Activity: Not on file  Stress: Not on file  Social Connections: Not on file  Intimate Partner Violence: Not on file        Objective:    BP 125/86   Pulse 94   Temp (!) 96.5 F (35.8 C) (Temporal)   Ht $R'5\' 6"'iz$  (1.676 m)   Wt 203 lb 3.2 oz (92.2 kg)   SpO2 98%   BMI 32.80 kg/m   Wt Readings from Last 3 Encounters:  06/20/22 203 lb 3.2 oz (92.2 kg)  04/25/22 206 lb (93.4 kg)  04/05/22 204 lb 9.6 oz (92.8 kg)    Physical Exam Vitals reviewed.  Constitutional:      General: She is not in acute distress.    Appearance: Normal appearance. She is obese. She is not ill-appearing, toxic-appearing or diaphoretic.  HENT:     Head: Normocephalic and atraumatic.     Right Ear: Tympanic membrane, ear canal and external ear normal. There is no impacted cerumen.     Left Ear: Tympanic membrane, ear canal and external ear normal. There is no impacted cerumen.  Eyes:     General: No scleral icterus.       Right eye: No discharge.        Left eye: No discharge.     Conjunctiva/sclera: Conjunctivae normal.  Cardiovascular:  Rate and Rhythm: Normal rate and regular rhythm.     Heart sounds: Normal heart sounds. No murmur heard.    No friction rub. No gallop.  Pulmonary:     Effort: Pulmonary effort is normal. No respiratory distress.     Breath sounds: Normal breath sounds. No stridor. No wheezing, rhonchi or rales.  Musculoskeletal:        General: Normal range of motion.     Cervical back: Normal range of motion.  Skin:    General: Skin is warm and dry.     Capillary Refill: Capillary refill takes less than 2 seconds.  Neurological:     General: No focal deficit present.     Mental Status: She is alert and oriented to person, place, and time. Mental status is at baseline.  Psychiatric:        Mood and Affect: Mood normal.         Behavior: Behavior normal.        Thought Content: Thought content normal.        Judgment: Judgment normal.     Lab Results  Component Value Date   TSH 0.539 04/05/2022   Lab Results  Component Value Date   WBC 5.3 04/05/2022   HGB 16.1 (H) 04/05/2022   HCT 47.0 (H) 04/05/2022   MCV 93 04/05/2022   PLT 184 04/05/2022   Lab Results  Component Value Date   NA 142 04/05/2022   K 4.7 04/05/2022   CO2 23 04/05/2022   GLUCOSE 109 (H) 04/05/2022   BUN 16 04/05/2022   CREATININE 0.71 04/05/2022   BILITOT 1.0 04/05/2022   ALKPHOS 78 04/05/2022   AST 28 04/05/2022   ALT 39 (H) 04/05/2022   PROT 6.7 04/05/2022   ALBUMIN 4.7 04/05/2022   CALCIUM 10.1 04/05/2022   ANIONGAP 8 07/29/2021   EGFR 102 04/05/2022   Lab Results  Component Value Date   CHOL 212 (H) 04/05/2022   Lab Results  Component Value Date   HDL 39 (L) 04/05/2022   Lab Results  Component Value Date   LDLCALC 152 (H) 04/05/2022   Lab Results  Component Value Date   TRIG 116 04/05/2022   Lab Results  Component Value Date   CHOLHDL 5.4 (H) 04/05/2022   No results found for: "HGBA1C"

## 2022-06-27 ENCOUNTER — Encounter: Payer: Self-pay | Admitting: Family Medicine

## 2022-07-02 ENCOUNTER — Ambulatory Visit: Payer: 59 | Admitting: Neurology

## 2022-07-04 ENCOUNTER — Ambulatory Visit: Payer: 59 | Admitting: Family Medicine

## 2022-07-11 ENCOUNTER — Encounter: Payer: Self-pay | Admitting: Family Medicine

## 2022-07-17 ENCOUNTER — Encounter: Payer: Self-pay | Admitting: Family Medicine

## 2022-07-18 NOTE — Telephone Encounter (Signed)
Please change visit to a telephone.

## 2022-07-19 NOTE — Telephone Encounter (Signed)
Changed to tele

## 2022-08-08 ENCOUNTER — Telehealth: Payer: Self-pay | Admitting: Family Medicine

## 2022-08-21 ENCOUNTER — Ambulatory Visit: Payer: 59 | Admitting: Neurology

## 2022-08-30 ENCOUNTER — Ambulatory Visit: Payer: 59 | Admitting: Family Medicine

## 2022-08-31 ENCOUNTER — Ambulatory Visit: Payer: 59 | Admitting: Family Medicine

## 2022-09-04 ENCOUNTER — Ambulatory Visit: Payer: Self-pay | Admitting: Family Medicine

## 2022-11-06 ENCOUNTER — Other Ambulatory Visit (HOSPITAL_COMMUNITY): Payer: Self-pay | Admitting: Family Medicine

## 2022-11-06 DIAGNOSIS — M25562 Pain in left knee: Secondary | ICD-10-CM

## 2022-11-21 ENCOUNTER — Encounter: Payer: Self-pay | Admitting: Family Medicine

## 2022-11-21 ENCOUNTER — Ambulatory Visit (INDEPENDENT_AMBULATORY_CARE_PROVIDER_SITE_OTHER): Payer: BLUE CROSS/BLUE SHIELD | Admitting: Family Medicine

## 2022-11-21 VITALS — BP 115/73 | HR 86 | Temp 97.0°F | Ht 66.0 in | Wt 193.2 lb

## 2022-11-21 DIAGNOSIS — Z0000 Encounter for general adult medical examination without abnormal findings: Secondary | ICD-10-CM

## 2022-11-21 DIAGNOSIS — G2581 Restless legs syndrome: Secondary | ICD-10-CM | POA: Insufficient documentation

## 2022-11-21 DIAGNOSIS — G4709 Other insomnia: Secondary | ICD-10-CM | POA: Insufficient documentation

## 2022-11-21 DIAGNOSIS — Z79899 Other long term (current) drug therapy: Secondary | ICD-10-CM | POA: Insufficient documentation

## 2022-11-21 DIAGNOSIS — E559 Vitamin D deficiency, unspecified: Secondary | ICD-10-CM | POA: Diagnosis not present

## 2022-11-21 DIAGNOSIS — Z1211 Encounter for screening for malignant neoplasm of colon: Secondary | ICD-10-CM | POA: Diagnosis not present

## 2022-11-21 DIAGNOSIS — K581 Irritable bowel syndrome with constipation: Secondary | ICD-10-CM

## 2022-11-21 DIAGNOSIS — Z1231 Encounter for screening mammogram for malignant neoplasm of breast: Secondary | ICD-10-CM | POA: Diagnosis not present

## 2022-11-21 DIAGNOSIS — J4540 Moderate persistent asthma, uncomplicated: Secondary | ICD-10-CM

## 2022-11-21 DIAGNOSIS — E039 Hypothyroidism, unspecified: Secondary | ICD-10-CM

## 2022-11-21 DIAGNOSIS — F5101 Primary insomnia: Secondary | ICD-10-CM

## 2022-11-21 DIAGNOSIS — Z2821 Immunization not carried out because of patient refusal: Secondary | ICD-10-CM

## 2022-11-21 DIAGNOSIS — Z1212 Encounter for screening for malignant neoplasm of rectum: Secondary | ICD-10-CM

## 2022-11-21 DIAGNOSIS — Z0001 Encounter for general adult medical examination with abnormal findings: Secondary | ICD-10-CM

## 2022-11-21 DIAGNOSIS — I10 Essential (primary) hypertension: Secondary | ICD-10-CM

## 2022-11-21 MED ORDER — LOSARTAN POTASSIUM 50 MG PO TABS: 50.0000 mg | ORAL_TABLET | Freq: Every day | ORAL | 1 refills | Status: DC

## 2022-11-21 MED ORDER — ZOLPIDEM TARTRATE 5 MG PO TABS: 5.0000 mg | ORAL_TABLET | Freq: Every evening | ORAL | 5 refills | Status: DC | PRN

## 2022-11-21 NOTE — Progress Notes (Signed)
Olivia Zamora is a 51 y.o. female presents to office today for annual physical exam examination.    Concerns today include: 1. Establishing with new provider as her former PCP is no longer at office. Does need refill on chronic medications. States she is doing well on all of them. No reported side effects. Is followed by specialists for AVM malformations.   Occupation: Retired Marine scientist Marital status: Married Substance use: denies Diet: fairly healthy Exercise: not regular Last eye exam: over a year Last dental exam: regular Last colonoscopy: never Last mammogram: 2021 Last pap smear: hysterectomy, not due to cancer Refills needed today: all medications Immunizations needed:  Flu Vaccine: declined  Tdap Vaccine: no  Zoster Vaccine: declined   Past Medical History:  Diagnosis Date   Anxiety    Failed therapy with Paxil, Lexparo, Prozac, Wellbutrin, Xanax, and Ativan   Arthritis    Asthma    Cardiac disease    Angina   Chronic back pain    Constipation 08/26/2019   Depression    Failed therapy with Paxil, Lexparo, Prozac, Wellbutrin, and Ativan   Disease of thyroid gland 12/28/2011   multiple thyroid nodules in 2013 - [place] on 03/08/14 showed heterogeneous appearance of thyroid gland without focal nodule   Eczema    Generalized seizure ([place])    stress related   GERD (gastroesophageal reflux disease)    History of DVT (deep vein thrombosis) 1988   Hypertension    IBS (irritable bowel syndrome)    Internal derangement of left knee    Lactose intolerance    Migraine    Sleep apnea    wears CPAP   Vitamin D deficiency    Social History   Socioeconomic History   Marital status: Married    Spouse name: Not on file   Number of children: Not on file   Years of education: Not on file   Highest education level: Not on file  Occupational History   Not on file  Tobacco Use   Smoking status: Former    Types: Cigarettes   Smokeless tobacco: Never  Substance and Sexual  Activity   Alcohol use: Never   Drug use: Never   Sexual activity: Not on file  Other Topics Concern   Not on file  Social History Narrative   Not on file   Social Determinants of Health   Financial Resource Strain: Not on file  Food Insecurity: Not on file  Transportation Needs: Not on file  Physical Activity: Not on file  Stress: Not on file  Social Connections: Not on file  Intimate Partner Violence: Not on file   Past Surgical History:  Procedure Laterality Date   CESAREAN SECTION     x3   COLONOSCOPY     HEMANGIOMA EXCISION Right    shoulder   IR RADIOLOGIST EVAL & MGMT  05/04/2021   MASS EXCISION Right    lump from palm of hand   OOPHORECTOMY     OTHER SURGICAL HISTORY Right    Repair of tib-fib fracture   TOTAL ABDOMINAL HYSTERECTOMY     TUBAL LIGATION     TUMOR EXCISION Right    Axilla - benign   VASCULAR SURGERY Right 1989   blood clot removed from neck   Family History  Problem Relation Age of Onset   Other Niece        antiphospholipid AB syndrome   Factor V Leiden deficiency Sister    CVA Sister  37s   Thyroid disease Sister    Breast cancer Sister    Multiple sclerosis Sister    Other Sister        guillain barre   Depression Sister    Migraines Sister    Arthritis Mother    Hypertension Mother    Thyroid disease Mother    Depression Mother    Migraines Mother    Arthritis Father    Hypertension Father    Depression Father    Hypertension Maternal Grandmother    Depression Maternal Grandmother    Anxiety disorder Maternal Grandmother    Hypertension Maternal Grandfather    Hypertension Paternal Grandmother    Hypertension Paternal Grandfather    CVA Sister    Thyroid disease Sister    Depression Sister    Migraines Sister    Asthma Daughter    Depression Daughter    Anxiety disorder Daughter    Irritable bowel syndrome Daughter    Lactose intolerance Daughter    Migraines Daughter    Asthma Son    Depression Son     Anxiety disorder Son    Irritable bowel syndrome Son    Lactose intolerance Son    Migraines Son    Asthma Son    Depression Son    Anxiety disorder Son    Irritable bowel syndrome Son    Lactose intolerance Son    Migraines Son     Current Outpatient Medications:    albuterol (ACCUNEB) 0.63 MG/3ML nebulizer solution, Take 3 mLs (0.63 mg total) by nebulization every 6 (six) hours as needed for wheezing., Disp: 75 mL, Rfl: 1   aspirin EC 81 MG tablet, Take 81 mg by mouth daily., Disp: , Rfl:    buPROPion (WELLBUTRIN XL) 300 MG 24 hr tablet, Take 1 tablet (300 mg total) by mouth daily., Disp: 90 tablet, Rfl: 1   docusate sodium (COLACE) 100 MG capsule, Take 1 capsule (100 mg total) by mouth daily., Disp: 90 capsule, Rfl: 1   Multiple Vitamins-Minerals (MULTIVITAMIN ADULT PO), Take 1 tablet by mouth daily., Disp: , Rfl:    SYNTHROID 100 MCG tablet, Take 1 tablet by mouth every other day., Disp: , Rfl:    SYNTHROID 112 MCG tablet, Take 112 mcg by mouth every other day., Disp: , Rfl:    Vitamin D, Ergocalciferol, (DRISDOL) 1.25 MG (50000 UT) CAPS capsule, Take 50,000 Units by mouth every 7 (seven) days., Disp: , Rfl:    losartan (COZAAR) 50 MG tablet, Take 1 tablet (50 mg total) by mouth daily., Disp: 90 tablet, Rfl: 1   zolpidem (AMBIEN) 5 MG tablet, Take 1 tablet (5 mg total) by mouth at bedtime as needed for sleep., Disp: 30 tablet, Rfl: 5  Allergies  Allergen Reactions   Iodine Anaphylaxis   Iohexol Anaphylaxis    Pre meds given in past with no reaction noted.     Latex Anaphylaxis and Other (See Comments)   Topamax [Topiramate] Anaphylaxis     Review of Systems Constitutional: negative Eyes: negative Ears, nose, mouth, throat, and face: negative Respiratory: negative Cardiovascular: negative Gastrointestinal: negative Genitourinary:negative Integument/breast: negative Hematologic/lymphatic: negative Musculoskeletal:negative, AVM behind left knee, has brace on Neurological:  negative Behavioral/Psych: positive for sleep disturbance Endocrine: negative Allergic/Immunologic: negative    Physical exam BP 115/73   Pulse 86   Temp (!) 97 F (36.1 C) (Temporal)   Ht _0  (1.676 m)   Wt 193 lb 3.2 oz (87.6 kg)   SpO2 98%   BMI  31.18 kg/m  General appearance: alert, cooperative, appears stated age, no distress, and mildly obese Head: Normocephalic, without obvious abnormality, atraumatic Eyes: conjunctivae/corneas clear. PERRL, EOM's intact. Fundi benign. Ears: normal TM's and external ear canals both ears Nose: Nares normal. Septum midline. Mucosa normal. No drainage or sinus tenderness. Throat: lips, mucosa, and tongue normal; teeth and gums normal Neck: no adenopathy, no carotid bruit, no JVD, supple, symmetrical, trachea midline, and thyroid not enlarged, symmetric, no tenderness/mass/nodules Back: symmetric, no curvature. ROM normal. No CVA tenderness. Lungs: clear to auscultation bilaterally Heart: regular rate and rhythm, S1, S2 normal, no murmur, click, rub or gallop Abdomen: soft, non-tender; bowel sounds normal; no masses,  no organomegaly Extremities: extremities normal, atraumatic, no cyanosis or edema and brace to left knee Pulses: 2+ and symmetric Skin: Skin color, texture, turgor normal. No rashes or lesions Lymph nodes: Cervical, supraclavicular, and axillary nodes normal. Neurologic: Alert and oriented X 3, normal strength and tone. Normal symmetric reflexes. Normal coordination and gait    Assessment/ Plan: Olivia Zamora was seen today for establish care and annual exam.  Diagnoses and all orders for this visit:  Annual physical exam Health maintenance discussed in detail. Labs pending. Agreed to some screenings. Declined vaccinations.  -     losartan (COZAAR) 50 MG tablet; Take 1 tablet (50 mg total) by mouth daily. -     zolpidem (AMBIEN) 5 MG tablet; Take 1 tablet (5 mg total) by mouth at bedtime as needed for sleep. -     ToxASSURE  Select 13 (MW), Urine -     CMP14+EGFR -     CBC with Differential/Platelet -     Lipid panel -     VITAMIN D 25 Hydroxy (Vit-D Deficiency, Fractures) -     Cologuard -     MM Digital Screening; Future -     TSH -     T4, Free -     T3, Free  Screening for colorectal cancer -     Cologuard  Encounter for screening mammogram for malignant neoplasm of breast -     MM Digital Screening; Future  Vitamin D deficiency Labs pending. Continue repletion therapy. If indicated, will change repletion dosage. Eat foods rich in Vit D including milk, orange juice, yogurt with vitamin D added, salmon or mackerel, canned tuna fish, cereals with vitamin D added, and cod liver oil. Get out in the sun but make sure to wear at least SPF 30 sunscreen.  -     VITAMIN D 25 Hydroxy (Vit-D Deficiency, Fractures)  Essential hypertension BP well controlled. Changes were not made in regimen today. Goal BP is 130/80. Pt aware to report any persistent high or low readings. DASH diet and exercise encouraged. Exercise at least 150 minutes per week and increase as tolerated. Goal BMI > 25. Stress management encouraged. Avoid nicotine and tobacco product use. Avoid excessive alcohol and NSAID's. Avoid more than 2000 mg of sodium daily. Medications as prescribed. Follow up as scheduled.  -     losartan (COZAAR) 50 MG tablet; Take 1 tablet (50 mg total) by mouth daily. -     CMP14+EGFR -     CBC with Differential/Platelet -     Lipid panel -     TSH  Primary insomnia Doing well on below, will continue.  -     zolpidem (AMBIEN) 5 MG tablet; Take 1 tablet (5 mg total) by mouth at bedtime as needed for sleep. -     TSH -  T4, Free -     T3, Free  Acquired hypothyroidism Followed by endocrinology, will check labs today as pt has upcoming appointment.  -     TSH -     T4, Free -     T3, Free  Irritable bowel syndrome with constipation Trial on Linzess given today, pt to let provider know if this is beneficial.    Vaccination declined      Counseled on healthy lifestyle choices, including diet (rich in fruits, vegetables and lean meats and low in salt and simple carbohydrates) and exercise (at least 30 minutes of moderate physical activity daily).  Patient to follow up in 1 year for annual exam or sooner if needed.  The above assessment and management plan was discussed with the patient. The patient verbalized understanding of and has agreed to the management plan. Patient is aware to call the clinic if symptoms persist or worsen. Patient is aware when to return to the clinic for a follow-up visit. Patient educated on when it is appropriate to go to the emergency department.   Monia Pouch, FNP-C [facility] [address] [place], [place] [address] [phone]

## 2022-11-22 LAB — T4, FREE: Free T4: 1.64 ng/dL (ref 0.82–1.77)

## 2022-11-22 LAB — CBC WITH DIFFERENTIAL/PLATELET
Basophils Absolute: 0 10*3/uL (ref 0.0–0.2)
Basos: 0 %
EOS (ABSOLUTE): 0.1 10*3/uL (ref 0.0–0.4)
Eos: 3 %
Hematocrit: 42.8 % (ref 34.0–46.6)
Hemoglobin: 14.9 g/dL (ref 11.1–15.9)
Immature Grans (Abs): 0 10*3/uL (ref 0.0–0.1)
Immature Granulocytes: 0 %
Lymphocytes Absolute: 1.4 10*3/uL (ref 0.7–3.1)
Lymphs: 33 %
MCH: 31.8 pg (ref 26.6–33.0)
MCHC: 34.8 g/dL (ref 31.5–35.7)
MCV: 92 fL (ref 79–97)
Monocytes Absolute: 0.3 10*3/uL (ref 0.1–0.9)
Monocytes: 8 %
Neutrophils Absolute: 2.4 10*3/uL (ref 1.4–7.0)
Neutrophils: 56 %
Platelets: 178 10*3/uL (ref 150–450)
RBC: 4.68 x10E6/uL (ref 3.77–5.28)
RDW: 12.5 % (ref 11.7–15.4)
WBC: 4.3 10*3/uL (ref 3.4–10.8)

## 2022-11-22 LAB — LIPID PANEL
Chol/HDL Ratio: 6 ratio — ABNORMAL HIGH (ref 0.0–4.4)
Cholesterol, Total: 186 mg/dL (ref 100–199)
HDL: 31 mg/dL — ABNORMAL LOW (ref >39)
LDL Chol Calc (NIH): 120 mg/dL — ABNORMAL HIGH (ref 0–99)
Triglycerides: 197 mg/dL — ABNORMAL HIGH (ref 0–149)
VLDL Cholesterol Cal: 35 mg/dL (ref 5–40)

## 2022-11-22 LAB — CMP14+EGFR
ALT: 22 IU/L (ref 0–32)
AST: 13 IU/L (ref 0–40)
Albumin/Globulin Ratio: 2.3 — ABNORMAL HIGH (ref 1.2–2.2)
Albumin: 4.4 g/dL (ref 3.8–4.9)
Alkaline Phosphatase: 85 IU/L (ref 44–121)
BUN/Creatinine Ratio: 15 (ref 9–23)
BUN: 11 mg/dL (ref 6–24)
Bilirubin Total: 0.5 mg/dL (ref 0.0–1.2)
CO2: 23 mmol/L (ref 20–29)
Calcium: 9.7 mg/dL (ref 8.7–10.2)
Chloride: 103 mmol/L (ref 96–106)
Creatinine, Ser: 0.72 mg/dL (ref 0.57–1.00)
Globulin, Total: 1.9 g/dL (ref 1.5–4.5)
Glucose: 143 mg/dL — ABNORMAL HIGH (ref 70–99)
Potassium: 4.2 mmol/L (ref 3.5–5.2)
Sodium: 142 mmol/L (ref 134–144)
Total Protein: 6.3 g/dL (ref 6.0–8.5)
eGFR: 100 mL/min/{1.73_m2} (ref >59)

## 2022-11-22 LAB — T3, FREE: T3, Free: 3 pg/mL (ref 2.0–4.4)

## 2022-11-22 LAB — VITAMIN D 25 HYDROXY (VIT D DEFICIENCY, FRACTURES): Vit D, 25-Hydroxy: 61.1 ng/mL (ref 30.0–100.0)

## 2022-11-22 LAB — TSH: TSH: 0.163 u[IU]/mL — ABNORMAL LOW (ref 0.450–4.500)

## 2022-11-24 ENCOUNTER — Encounter: Payer: Self-pay | Admitting: Family Medicine

## 2022-11-24 LAB — TOXASSURE SELECT 13 (MW), URINE

## 2022-11-26 ENCOUNTER — Encounter: Payer: Self-pay | Admitting: Family Medicine

## 2022-11-26 MED ORDER — LINACLOTIDE 72 MCG PO CAPS: 72.0000 ug | ORAL_CAPSULE | Freq: Every day | ORAL | 6 refills | Status: DC

## 2022-11-26 MED ORDER — SPIRIVA RESPIMAT 1.25 MCG/ACT IN AERS: 2.0000 | INHALATION_SPRAY | Freq: Every day | RESPIRATORY_TRACT | 11 refills | Status: DC

## 2022-11-26 NOTE — Addendum Note (Signed)
Addended by: Sonny Masters on: 11/26/2022 09:54 AM   Modules accepted: Orders

## 2022-12-05 ENCOUNTER — Encounter: Payer: Self-pay | Admitting: Family Medicine

## 2022-12-05 ENCOUNTER — Other Ambulatory Visit: Payer: Self-pay | Admitting: Family Medicine

## 2022-12-05 DIAGNOSIS — F332 Major depressive disorder, recurrent severe without psychotic features: Secondary | ICD-10-CM

## 2022-12-06 LAB — COLOGUARD: COLOGUARD: NEGATIVE

## 2022-12-11 ENCOUNTER — Encounter: Payer: Self-pay | Admitting: Diagnostic Neuroimaging

## 2022-12-11 ENCOUNTER — Ambulatory Visit: Payer: BLUE CROSS/BLUE SHIELD | Admitting: Diagnostic Neuroimaging

## 2022-12-11 VITALS — BP 95/65 | HR 82 | Ht 67.0 in | Wt 190.6 lb

## 2022-12-11 DIAGNOSIS — G43E19 Chronic migraine with aura, intractable, without status migrainosus: Secondary | ICD-10-CM | POA: Diagnosis not present

## 2022-12-11 MED ORDER — NURTEC 75 MG PO TBDP: 75.0000 mg | ORAL_TABLET | Freq: Every day | ORAL | 6 refills | Status: DC | PRN

## 2022-12-11 MED ORDER — AIMOVIG 70 MG/ML ~~LOC~~ SOAJ: 70.0000 mg | SUBCUTANEOUS | 4 refills | Status: DC

## 2022-12-11 NOTE — Patient Instructions (Signed)
  MULTIPLE VASCULAR MALFORMATIONS (could be part of underlying genetic syndrome) - follow up with [facility] vascular malformation clinic (likely needs conventional cerebral angiogram)  HEADACHES / MIGRAINES - tried inderal, topiramate, gabapentin, imitrex - trial of aimovig injection for migraine prevention - trial of nurtec 75mg  as needed for migraine rescue  LEFT KNEE PAIN (could be related to underlying vascular malformation) - follow up with orthopedic clinic / vascular clinic

## 2022-12-11 NOTE — Progress Notes (Signed)
[facility]  PATIENT: Olivia Zamora DOB: December 07, 1971  REFERRING CLINICIAN: Loman Brooklyn, FNP HISTORY FROM: patient  REASON FOR VISIT: new consult    HISTORICAL  CHIEF COMPLAINT:  Chief Complaint  Patient presents with   New Patient (Initial Visit)    Patient in room #6 with her husband. Pt here today to discuss migraine headaches.    HISTORY OF PRESENT ILLNESS:   52 year old female here for evaluation of headaches.    In 2017 had episode of facial drooping and unilateral weakness.  She went to the hospital for evaluation.  Stroke workup was obtained.  Stroke was ruled out.  She was diagnosed with possible complicated migraine.  She followed up with neurology in [place] where she was living at the time.  Has some history of migraine with aura since childhood.  Headaches typically in the back of her head, sometimes on the right side with throbbing pain, nausea, sensitive to light and sound.  She has tried propranolol, topiramate, gabapentin, Imitrex without relief.  Also has been having some issues with left knee pain (since 2022).  She saw orthopedic clinic and was diagnosed with vascular malformation of the knee.  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  She saw [facility] neurosurgery for evaluation.  She was recommended have additional test including cerebral angiogram and MRI of the thoracic and lumbar spine.  However she could not complete this testing due to financial and insurance reasons.   REVIEW OF SYSTEMS: Full 14 system review of systems performed and negative with exception of: as per HPI.  ALLERGIES: Allergies  Allergen Reactions   Iodine Anaphylaxis   Iohexol Anaphylaxis    Pre meds given in past with no reaction noted.     Latex Anaphylaxis and Other (See Comments)   Topamax [Topiramate] Anaphylaxis    HOME MEDICATIONS: Outpatient Medications Prior to Visit  Medication Sig Dispense  Refill   albuterol (ACCUNEB) 0.63 MG/3ML nebulizer solution Take 3 mLs (0.63 mg total) by nebulization every 6 (six) hours as needed for wheezing. 75 mL 1   aspirin EC 81 MG tablet Take 81 mg by mouth daily.     buPROPion (WELLBUTRIN XL) 300 MG 24 hr tablet Take 1 tablet by mouth once daily 90 tablet 1   docusate sodium (COLACE) 100 MG capsule Take 1 capsule (100 mg total) by mouth daily. 90 capsule 1   linaclotide (LINZESS) 72 MCG capsule Take 1 capsule (72 mcg total) by mouth daily before breakfast. 30 capsule 6   losartan (COZAAR) 50 MG tablet Take 1 tablet (50 mg total) by mouth daily. 90 tablet 1   Multiple Vitamins-Minerals (MULTIVITAMIN ADULT PO) Take 1 tablet by mouth daily.     SYNTHROID 100 MCG tablet Take 1 tablet by mouth every other day.     SYNTHROID 112 MCG tablet Take 112 mcg by mouth every other day.     Tiotropium Bromide Monohydrate (SPIRIVA RESPIMAT) 1.25 MCG/ACT AERS Inhale 2 puffs into the lungs daily. 4 g 11   Vitamin D, Ergocalciferol, (DRISDOL) 1.25 MG (50000 UT) CAPS capsule Take 50,000 Units by mouth every 7 (seven) days.     zolpidem (AMBIEN) 5 MG tablet Take 1 tablet (5 mg total) by mouth at bedtime as needed for sleep. 30 tablet 5   No facility-administered medications prior to visit.    PAST MEDICAL HISTORY: Past Medical History:  Diagnosis Date   Anxiety    Failed therapy with Paxil,  Lexparo, Prozac, Wellbutrin, Xanax, and Ativan   Arthritis    Asthma    Cardiac disease    Angina   Chronic back pain    Constipation 08/26/2019   Depression    Failed therapy with Paxil, Lexparo, Prozac, Wellbutrin, and Ativan   Disease of thyroid gland 12/28/2011   multiple thyroid nodules in 2013 - [place] on 03/08/14 showed heterogeneous appearance of thyroid gland without focal nodule   Eczema    Generalized seizure ([place])    stress related   GERD (gastroesophageal reflux disease)    History of DVT (deep vein thrombosis) 1988   Hypertension    IBS (irritable bowel  syndrome)    Internal derangement of left knee    Lactose intolerance    Migraine    Sleep apnea    wears CPAP   Vitamin D deficiency     PAST SURGICAL HISTORY: Past Surgical History:  Procedure Laterality Date   CESAREAN SECTION     x3   COLONOSCOPY     HEMANGIOMA EXCISION Right    shoulder   IR RADIOLOGIST EVAL & MGMT  05/04/2021   MASS EXCISION Right    lump from palm of hand   OOPHORECTOMY     OTHER SURGICAL HISTORY Right    Repair of tib-fib fracture   TOTAL ABDOMINAL HYSTERECTOMY     TUBAL LIGATION     TUMOR EXCISION Right    Axilla - benign   VASCULAR SURGERY Right 1989   blood clot removed from neck    FAMILY HISTORY: Family History  Problem Relation Age of Onset   Other Niece        antiphospholipid AB syndrome   Factor V Leiden deficiency Sister    CVA Sister        32s   Thyroid disease Sister    Breast cancer Sister    Multiple sclerosis Sister    Other Sister        guillain barre   Depression Sister    Migraines Sister    Arthritis Mother    Hypertension Mother    Thyroid disease Mother    Depression Mother    Migraines Mother    Arthritis Father    Hypertension Father    Depression Father    Hypertension Maternal Grandmother    Depression Maternal Grandmother    Anxiety disorder Maternal Grandmother    Hypertension Maternal Grandfather    Hypertension Paternal Grandmother    Hypertension Paternal Grandfather    CVA Sister    Thyroid disease Sister    Depression Sister    Migraines Sister    Asthma Daughter    Depression Daughter    Anxiety disorder Daughter    Irritable bowel syndrome Daughter    Lactose intolerance Daughter    Migraines Daughter    Asthma Son    Depression Son    Anxiety disorder Son    Irritable bowel syndrome Son    Lactose intolerance Son    Migraines Son    Asthma Son    Depression Son    Anxiety disorder Son    Irritable bowel syndrome Son    Lactose intolerance Son    Migraines Son     SOCIAL  HISTORY: Social History   Socioeconomic History   Marital status: Married    Spouse name: Not on file   Number of children: Not on file   Years of education: Not on file   Highest education level: Not on file  Occupational History   Not on file  Tobacco Use   Smoking status: Former    Types: Cigarettes   Smokeless tobacco: Never  Substance and Sexual Activity   Alcohol use: Never   Drug use: Never   Sexual activity: Not on file  Other Topics Concern   Not on file  Social History Narrative   Not on file   Social Determinants of Health   Financial Resource Strain: Not on file  Food Insecurity: Not on file  Transportation Needs: Not on file  Physical Activity: Not on file  Stress: Not on file  Social Connections: Not on file  Intimate Partner Violence: Not on file     PHYSICAL EXAM  GENERAL EXAM/CONSTITUTIONAL: Vitals:  Vitals:   12/11/22 1117  BP: 95/65  Pulse: 82  Weight: 190 lb 9.6 oz (86.5 kg)  Height: 5\' 7"  (1.702 m)   Body mass index is 29.85 kg/m. Wt Readings from Last 3 Encounters:  12/11/22 190 lb 9.6 oz (86.5 kg)  11/21/22 193 lb 3.2 oz (87.6 kg)  06/20/22 203 lb 3.2 oz (92.2 kg)   Patient is in no distress; well developed, nourished and groomed; neck is supple  CARDIOVASCULAR: Examination of carotid arteries is normal; no carotid bruits Regular rate and rhythm, no murmurs Examination of peripheral vascular system by observation and palpation is normal  EYES: Ophthalmoscopic exam of optic discs and posterior segments is normal; no papilledema or hemorrhages Vision Screening   Right eye Left eye Both eyes  Without correction 20/50 20/100   With correction       MUSCULOSKELETAL: Gait, strength, tone, movements noted in Neurologic exam below  NEUROLOGIC: MENTAL STATUS:      No data to display         awake, alert, oriented to person, place and time recent and remote memory intact normal attention and concentration language fluent,  comprehension intact, naming intact fund of knowledge appropriate  CRANIAL NERVE:  2nd - no papilledema on fundoscopic exam 2nd, 3rd, 4th, 6th - pupils equal and reactive to light, visual fields full to confrontation, extraocular muscles intact, no nystagmus 5th - facial sensation symmetric 7th - facial strength symmetric 8th - hearing intact 9th - palate elevates symmetrically, uvula midline 11th - shoulder shrug symmetric 12th - tongue protrusion midline  MOTOR:  normal bulk and tone, full strength in the BUE, BLE  SENSORY:  normal and symmetric to light touch, temperature, vibration  COORDINATION:  finger-nose-finger, fine finger movements normal  REFLEXES:  deep tendon reflexes present and symmetric  GAIT/STATION:  narrow based gait     DIAGNOSTIC DATA (LABS, IMAGING, TESTING) - I reviewed patient records, labs, notes, testing and imaging myself where available.  Lab Results  Component Value Date   WBC 4.3 11/21/2022   HGB 14.9 11/21/2022   HCT 42.8 11/21/2022   MCV 92 11/21/2022   PLT 178 11/21/2022      Component Value Date/Time   NA 142 11/21/2022 1031   K 4.2 11/21/2022 1031   CL 103 11/21/2022 1031   CO2 23 11/21/2022 1031   GLUCOSE 143 (H) 11/21/2022 1031   GLUCOSE 108 (H) 07/29/2021 2357   BUN 11 11/21/2022 1031   CREATININE 0.72 11/21/2022 1031   CALCIUM 9.7 11/21/2022 1031   PROT 6.3 11/21/2022 1031   ALBUMIN 4.4 11/21/2022 1031   AST 13 11/21/2022 1031   ALT 22 11/21/2022 1031   ALKPHOS 85 11/21/2022 1031   BILITOT 0.5 11/21/2022 1031   GFRNONAA >60  07/29/2021 2357   GFRAA 123 08/21/2019 1428   Lab Results  Component Value Date   CHOL 186 11/21/2022   HDL 31 (L) 11/21/2022   LDLCALC 120 (H) 11/21/2022   TRIG 197 (H) 11/21/2022   CHOLHDL 6.0 (H) 11/21/2022   No results found for: "HGBA1C" Lab Results  Component Value Date   VITAMINB12 319 04/05/2022   Lab Results  Component Value Date   TSH 0.163 (L) 11/21/2022   04/06/22 MRI  brain [I reviewed images myself and agree with interpretation. -VRP]  1. No acute intracranial abnormality. Normal for age non contrast MRI appearance of the brain. No evidence of demyelinating disease. 2. 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    ASSESSMENT AND PLAN  52 y.o. year old female here with:   Dx:  1. Intractable chronic migraine with aura and without status migrainosus      PLAN:   HEADACHES / MIGRAINES - tried and failed inderal, topiramate, gabapentin, imitrex - trial of aimovig injection for migraine prevention - trial of nurtec 75mg  as needed for migraine rescue  MULTIPLE VASCULAR MALFORMATIONS (could be part of underlying genetic syndrome) - follow up with [facility] vascular malformation clinic (likely needs conventional cerebral angiogram)  LEFT KNEE PAIN (could be related to underlying vascular malformation) - follow up with orthopedic clinic / vascular clinic  Meds ordered this encounter  Medications   Erenumab-aooe (AIMOVIG) 70 MG/ML SOAJ    Sig: Inject 70 mg into the skin every 30 (thirty) days.    Dispense:  3 mL    Refill:  4   Rimegepant Sulfate (NURTEC) 75 MG TBDP    Sig: Take 75 mg by mouth daily as needed.    Dispense:  8 tablet    Refill:  6   Return in about 6 months (around 06/11/2023).  I spent 60 minutes of face-to-face and non-face-to-face time with patient.  This included previsit chart review, lab review, study review, order entry, electronic health record documentation, patient education.      08/12/2023, MD 12/11/2022, 12:30 PM Certified in Neurology, Neurophysiology and Neuroimaging  [facility] [address], [address] [place], [place] [phone]

## 2022-12-13 ENCOUNTER — Other Ambulatory Visit: Payer: Self-pay | Admitting: Neurology

## 2022-12-13 ENCOUNTER — Encounter: Payer: Self-pay | Admitting: Neurology

## 2022-12-13 ENCOUNTER — Telehealth: Payer: Self-pay | Admitting: Neurology

## 2022-12-13 MED ORDER — AJOVY 225 MG/1.5ML ~~LOC~~ SOAJ: 1.5000 mL | SUBCUTANEOUS | 5 refills | Status: DC

## 2022-12-13 NOTE — Telephone Encounter (Signed)
Went to attempt PA for the patient for Aimovig and the first statement is   "patient's drug benefit plan provides coverage for other drugs which may be considered for treating your patient. Can your patient be treated with a formulary drug? Available Formulary Alternatives: AJOVY, EMGALITY, QULIPTA [NOTE: If yes, provide your patient with a new prescription for the formulary product."  Pt has not tried the above medication. Will ask if MD is ok with switching to one of the preferred , Ajovy for the patient to try that first.

## 2022-12-13 NOTE — Telephone Encounter (Signed)
PA submitted through [employer] for the nurtec KEY: [id number] Tried and failed 2 triptans.

## 2022-12-13 NOTE — Telephone Encounter (Signed)
PA approve 12/13/22 - 12/12/22

## 2022-12-13 NOTE — Telephone Encounter (Signed)
PA submitted through [employer] KEY:  [id number] Will await determination

## 2022-12-17 ENCOUNTER — Ambulatory Visit
Admission: RE | Admit: 2022-12-17 | Discharge: 2022-12-17 | Disposition: A | Discharge status: Home / Self Care | Payer: BLUE CROSS/BLUE SHIELD | Source: Ambulatory Visit | Attending: Family Medicine | Admitting: Family Medicine

## 2022-12-17 DIAGNOSIS — Z0000 Encounter for general adult medical examination without abnormal findings: Secondary | ICD-10-CM

## 2022-12-17 DIAGNOSIS — Z1231 Encounter for screening mammogram for malignant neoplasm of breast: Secondary | ICD-10-CM

## 2022-12-17 NOTE — Telephone Encounter (Signed)
PA approved 12/13/2022-03/13/2023 through [facility] [employer]

## 2022-12-25 ENCOUNTER — Telehealth: Payer: Self-pay | Admitting: Family Medicine

## 2022-12-25 DIAGNOSIS — F332 Major depressive disorder, recurrent severe without psychotic features: Secondary | ICD-10-CM

## 2022-12-25 DIAGNOSIS — Z0000 Encounter for general adult medical examination without abnormal findings: Secondary | ICD-10-CM

## 2022-12-25 DIAGNOSIS — K581 Irritable bowel syndrome with constipation: Secondary | ICD-10-CM

## 2022-12-25 DIAGNOSIS — I10 Essential (primary) hypertension: Secondary | ICD-10-CM

## 2022-12-25 MED ORDER — LOSARTAN POTASSIUM 50 MG PO TABS: 50.0000 mg | ORAL_TABLET | Freq: Every day | ORAL | 0 refills | Status: DC

## 2022-12-25 MED ORDER — BUPROPION HCL ER (XL) 300 MG PO TB24: 300.0000 mg | ORAL_TABLET | Freq: Every day | ORAL | 0 refills | Status: DC

## 2022-12-25 MED ORDER — LINACLOTIDE 72 MCG PO CAPS: 72.0000 ug | ORAL_CAPSULE | Freq: Every day | ORAL | 0 refills | Status: DC

## 2022-12-25 NOTE — Telephone Encounter (Signed)
Aware Rxs sent to [facility]

## 2022-12-25 NOTE — Telephone Encounter (Signed)
With new insurance pt need all rx sent to [facility] in [place] with a 90 day supply. Pt new rx for losartan (COZAAR) 50 MG tablet  buPROPion (WELLBUTRIN XL) 300 MG 24 hr tablet  linaclotide (LINZESS) 72 MCG capsule

## 2023-02-03 ENCOUNTER — Other Ambulatory Visit: Payer: Self-pay | Admitting: Family Medicine

## 2023-02-03 DIAGNOSIS — Z0000 Encounter for general adult medical examination without abnormal findings: Secondary | ICD-10-CM

## 2023-02-03 DIAGNOSIS — K581 Irritable bowel syndrome with constipation: Secondary | ICD-10-CM

## 2023-02-03 DIAGNOSIS — F332 Major depressive disorder, recurrent severe without psychotic features: Secondary | ICD-10-CM

## 2023-02-03 DIAGNOSIS — I10 Essential (primary) hypertension: Secondary | ICD-10-CM

## 2023-02-05 ENCOUNTER — Encounter: Payer: Self-pay | Admitting: Family Medicine

## 2023-02-05 ENCOUNTER — Other Ambulatory Visit: Payer: Self-pay | Admitting: *Deleted

## 2023-02-05 DIAGNOSIS — Z0000 Encounter for general adult medical examination without abnormal findings: Secondary | ICD-10-CM

## 2023-02-05 DIAGNOSIS — I10 Essential (primary) hypertension: Secondary | ICD-10-CM

## 2023-02-05 MED ORDER — LOSARTAN POTASSIUM 50 MG PO TABS: 50.0000 mg | ORAL_TABLET | Freq: Every day | ORAL | 0 refills | Status: DC

## 2023-02-07 ENCOUNTER — Encounter: Payer: Self-pay | Admitting: Radiology

## 2023-03-20 ENCOUNTER — Other Ambulatory Visit: Payer: BLUE CROSS/BLUE SHIELD

## 2023-03-20 ENCOUNTER — Ambulatory Visit (HOSPITAL_COMMUNITY)
Admission: RE | Admit: 2023-03-20 | Discharge: 2023-03-20 | Disposition: A | Discharge status: Home / Self Care | Payer: BLUE CROSS/BLUE SHIELD | Source: Ambulatory Visit | Attending: Family Medicine | Admitting: Family Medicine

## 2023-03-20 DIAGNOSIS — M25562 Pain in left knee: Secondary | ICD-10-CM

## 2023-04-10 ENCOUNTER — Telehealth: Payer: Self-pay | Admitting: Neurology

## 2023-04-10 NOTE — Telephone Encounter (Signed)
PA completed on [employer] KEY:[id number] Approved immediately  04/10/2023 to 04/09/2024.

## 2023-04-16 ENCOUNTER — Telehealth: Payer: Self-pay | Admitting: Family Medicine

## 2023-04-19 ENCOUNTER — Ambulatory Visit: Payer: BLUE CROSS/BLUE SHIELD | Admitting: Family Medicine

## 2023-04-30 ENCOUNTER — Other Ambulatory Visit: Payer: Self-pay | Admitting: Orthopaedic Surgery

## 2023-05-01 ENCOUNTER — Encounter: Payer: Self-pay | Admitting: Family Medicine

## 2023-05-07 ENCOUNTER — Other Ambulatory Visit: Payer: Self-pay | Admitting: Diagnostic Neuroimaging

## 2023-05-07 ENCOUNTER — Other Ambulatory Visit: Payer: Self-pay | Admitting: Family Medicine

## 2023-05-07 DIAGNOSIS — F332 Major depressive disorder, recurrent severe without psychotic features: Secondary | ICD-10-CM

## 2023-05-07 DIAGNOSIS — K581 Irritable bowel syndrome with constipation: Secondary | ICD-10-CM

## 2023-05-08 ENCOUNTER — Encounter (HOSPITAL_COMMUNITY): Payer: Self-pay

## 2023-05-09 ENCOUNTER — Ambulatory Visit: Payer: BLUE CROSS/BLUE SHIELD | Admitting: Family Medicine

## 2023-05-09 ENCOUNTER — Other Ambulatory Visit: Payer: Self-pay | Admitting: Family Medicine

## 2023-05-09 ENCOUNTER — Encounter: Payer: Self-pay | Admitting: Family Medicine

## 2023-05-09 VITALS — BP 127/52 | HR 69 | Temp 97.6°F | Ht 67.0 in | Wt 183.6 lb

## 2023-05-09 DIAGNOSIS — Z0000 Encounter for general adult medical examination without abnormal findings: Secondary | ICD-10-CM

## 2023-05-09 DIAGNOSIS — I10 Essential (primary) hypertension: Secondary | ICD-10-CM | POA: Diagnosis not present

## 2023-05-09 DIAGNOSIS — K581 Irritable bowel syndrome with constipation: Secondary | ICD-10-CM

## 2023-05-09 DIAGNOSIS — F332 Major depressive disorder, recurrent severe without psychotic features: Secondary | ICD-10-CM

## 2023-05-09 DIAGNOSIS — Z01818 Encounter for other preprocedural examination: Secondary | ICD-10-CM

## 2023-05-09 DIAGNOSIS — E782 Mixed hyperlipidemia: Secondary | ICD-10-CM

## 2023-05-09 DIAGNOSIS — E559 Vitamin D deficiency, unspecified: Secondary | ICD-10-CM

## 2023-05-09 LAB — CMP14+EGFR

## 2023-05-09 LAB — CBC WITH DIFFERENTIAL/PLATELET
Basophils Absolute: 0 10*3/uL (ref 0.0–0.2)
EOS (ABSOLUTE): 0.1 10*3/uL (ref 0.0–0.4)
Eos: 2 %
Hematocrit: 43.1 % (ref 34.0–46.6)
Immature Granulocytes: 0 %
Lymphocytes Absolute: 1.4 10*3/uL (ref 0.7–3.1)
Lymphs: 31 %
MCH: 30.9 pg (ref 26.6–33.0)
MCHC: 33.6 g/dL (ref 31.5–35.7)
Monocytes Absolute: 0.4 10*3/uL (ref 0.1–0.9)
Neutrophils Absolute: 2.6 10*3/uL (ref 1.4–7.0)
Neutrophils: 59 %
Platelets: 164 10*3/uL (ref 150–450)

## 2023-05-09 LAB — BAYER DCA HB A1C WAIVED: HB A1C (BAYER DCA - WAIVED): 5.4 % (ref 4.8–5.6)

## 2023-05-09 NOTE — Progress Notes (Signed)
Subjective:  [name], female    DOB: [birth year], 52 y.o.   MRN: [id number]  Patient Care Team: Sonny Masters, FNP as PCP - General (Family Medicine)   Chief Complaint:  surgical clearance   HPI: Olivia Zamora is a 52 y.o. female presenting on 05/09/2023 for surgical clearance   1. Preoperative examination Pt is scheduled to have knee replacement surgery. Spinal anesthesia is likely. Pt does have hypertension, hyperlipidemia, obesity, Vit D deficiency, and IBS. States since last visit, she is feeling great.   2. Essential hypertension Complaint with meds - Yes Current Medications - losartan Checking BP at home - no Exercising Regularly - No Watching Salt intake - Yes Pertinent ROS:  Headache - No Fatigue - No Visual Disturbances - No Chest pain - No Dyspnea - No Palpitations - No LE edema - No They report good compliance with medications and can restate their regimen by memory. No medication side effects.  BP Readings from Last 3 Encounters:  05/09/23 (!) 127/52  12/11/22 95/65  11/21/22 115/73     3. Vitamin D deficiency Pt is taking oral repletion therapy. Denies bone pain and tenderness, muscle weakness, fracture, and difficulty walking. Lab Results  Component Value Date   VD25OH 61.1 11/21/2022   VD25OH 62.6 08/21/2019   Lab Results  Component Value Date   CALCIUM 9.7 11/21/2022   PHOS 4.2 08/21/2019      4. Irritable bowel syndrome with constipation Doing great since initiation of Linzess. States she is having 2 regular bowel movements daily. No abdominal pain, melena, or hematochezia.   5. Mixed hyperlipidemia Has made dietary changes since last visit. She is fasting today.        Relevant past medical, surgical, family, and social history reviewed and updated as indicated.  Allergies and medications reviewed and updated. Data reviewed: Chart in Epic.   Past Medical History:  Diagnosis Date   Anxiety    Failed therapy  with Paxil, Lexparo, Prozac, Wellbutrin, Xanax, and Ativan   Arthritis    Asthma    Cardiac disease    Angina   Chronic back pain    Constipation 08/26/2019   Depression    Failed therapy with Paxil, Lexparo, Prozac, Wellbutrin, and Ativan   Disease of thyroid gland 12/28/2011   multiple thyroid nodules in 2013 - [place] on 03/08/14 showed heterogeneous appearance of thyroid gland without focal nodule   Eczema    Generalized seizure (HCC)    stress related   GERD (gastroesophageal reflux disease)    History of DVT (deep vein thrombosis) 1988   Hypertension    IBS (irritable bowel syndrome)    Internal derangement of left knee    Lactose intolerance    Migraine    Sleep apnea    wears CPAP   Vitamin D deficiency     Past Surgical History:  Procedure Laterality Date   CESAREAN SECTION     x3   COLONOSCOPY     HEMANGIOMA EXCISION Right    shoulder   IR RADIOLOGIST EVAL & MGMT  05/04/2021   MASS EXCISION Right    lump from palm of hand   OOPHORECTOMY     OTHER SURGICAL HISTORY Right    Repair of tib-fib fracture   TOTAL ABDOMINAL HYSTERECTOMY     TUBAL LIGATION     TUMOR EXCISION Right    Axilla - benign   VASCULAR SURGERY Right 1989   blood clot removed from neck  Social History   Socioeconomic History   Marital status: Married    Spouse name: Not on file   Number of children: Not on file   Years of education: Not on file   Highest education level: Associate degree: occupational, Scientist, product/process development, or vocational program  Occupational History   Not on file  Tobacco Use   Smoking status: Former    Types: Cigarettes   Smokeless tobacco: Never  Substance and Sexual Activity   Alcohol use: Never   Drug use: Never   Sexual activity: Not on file  Other Topics Concern   Not on file  Social History Narrative   Not on file   Social Determinants of Health   Financial Resource Strain: Low Risk  (05/07/2023)   Overall Financial Resource Strain (CARDIA)    Difficulty of  Paying Living Expenses: Not hard at all  Food Insecurity: No Food Insecurity (05/07/2023)   Hunger Vital Sign    Worried About Running Out of Food in the Last Year: Never true    Ran Out of Food in the Last Year: Never true  Transportation Needs: No Transportation Needs (05/07/2023)   PRAPARE - Administrator, Civil Service (Medical): No    Lack of Transportation (Non-Medical): No  Physical Activity: Sufficiently Active (05/07/2023)   Exercise Vital Sign    Days of Exercise per Week: 5 days    Minutes of Exercise per Session: 30 min  Stress: No Stress Concern Present (05/07/2023)   [employer] of Occupational Health - Occupational Stress Questionnaire    Feeling of Stress : Not at all  Social Connections: Moderately Integrated (05/07/2023)   Social Connection and Isolation Panel [NHANES]    Frequency of Communication with Friends and Family: More than three times a week    Frequency of Social Gatherings with Friends and Family: Once a week    Attends Religious Services: 1 to 4 times per year    Active Member of [employer] or Organizations: No    Attends Banker Meetings: Not on file    Marital Status: Married  Intimate Partner Violence: Not on file    Outpatient Encounter Medications as of 05/09/2023  Medication Sig   albuterol (ACCUNEB) 0.63 MG/3ML nebulizer solution Take 3 mLs (0.63 mg total) by nebulization every 6 (six) hours as needed for wheezing.   aspirin EC 81 MG tablet Take 81 mg by mouth daily.   buPROPion (WELLBUTRIN XL) 300 MG 24 hr tablet TAKE 1 TABLET BY MOUTH EVERY DAY   docusate sodium (COLACE) 100 MG capsule Take 1 capsule (100 mg total) by mouth daily.   Fremanezumab-vfrm (AJOVY) 225 MG/1.5ML SOAJ INJECT 1.5ML INTO THE SKIN EVERY 28 DAYS   LINZESS 72 MCG capsule TAKE 1 CAPSULE BY MOUTH EVERY DAY BEFORE BREAKFAST   losartan (COZAAR) 50 MG tablet Take 1 tablet (50 mg total) by mouth daily.   Multiple Vitamins-Minerals (MULTIVITAMIN ADULT PO)  Take 1 tablet by mouth daily.   Rimegepant Sulfate (NURTEC) 75 MG TBDP Take 75 mg by mouth daily as needed.   SYNTHROID 100 MCG tablet Take 1 tablet by mouth every other day.   Tiotropium Bromide Monohydrate (SPIRIVA RESPIMAT) 1.25 MCG/ACT AERS Inhale 2 puffs into the lungs daily.   Vitamin D, Ergocalciferol, (DRISDOL) 1.25 MG (50000 UT) CAPS capsule Take 50,000 Units by mouth every 7 (seven) days.   zolpidem (AMBIEN) 5 MG tablet Take 1 tablet (5 mg total) by mouth at bedtime as needed for sleep.   [DISCONTINUED]  SYNTHROID 112 MCG tablet Take 112 mcg by mouth every other day.   No facility-administered encounter medications on file as of 05/09/2023.    Allergies  Allergen Reactions   Iodine Anaphylaxis   Iohexol Anaphylaxis    Pre meds given in past with no reaction noted.     Latex Anaphylaxis and Other (See Comments)   Topamax [Topiramate] Anaphylaxis    Review of Systems  Constitutional:  Negative for activity change, appetite change, chills, diaphoresis, fatigue, fever and unexpected weight change.  HENT: Negative.    Eyes: Negative.  Negative for photophobia and visual disturbance.  Respiratory:  Negative for cough, chest tightness and shortness of breath.   Cardiovascular:  Negative for chest pain, palpitations and leg swelling.  Gastrointestinal:  Negative for abdominal distention, abdominal pain, anal bleeding, blood in stool, constipation, diarrhea, nausea, rectal pain and vomiting.  Endocrine: Negative.   Genitourinary:  Negative for decreased urine volume, difficulty urinating, dysuria, frequency and urgency.  Musculoskeletal:  Positive for arthralgias, gait problem and joint swelling. Negative for back pain, myalgias, neck pain and neck stiffness.  Skin: Negative.   Allergic/Immunologic: Negative.   Neurological:  Negative for dizziness and headaches.  Hematological: Negative.   Psychiatric/Behavioral:  Negative for confusion, hallucinations, sleep disturbance and  suicidal ideas.   All other systems reviewed and are negative.       Objective:  BP (!) 127/52   Pulse 69   Temp 97.6 F (36.4 C) (Temporal)   Ht 5\' 7"  (1.702 m)   Wt 183 lb 9.6 oz (83.3 kg)   SpO2 97%   BMI 28.76 kg/m    Wt Readings from Last 3 Encounters:  05/09/23 183 lb 9.6 oz (83.3 kg)  12/11/22 190 lb 9.6 oz (86.5 kg)  11/21/22 193 lb 3.2 oz (87.6 kg)    Physical Exam Vitals and nursing note reviewed.  Constitutional:      General: She is not in acute distress.    Appearance: Normal appearance. She is well-developed and well-groomed. She is not ill-appearing, toxic-appearing or diaphoretic.  HENT:     Head: Normocephalic and atraumatic.     Jaw: There is normal jaw occlusion.     Right Ear: Hearing normal.     Left Ear: Hearing normal.     Nose: Nose normal.     Mouth/Throat:     Lips: Pink.     Mouth: Mucous membranes are moist.     Pharynx: Oropharynx is clear. Uvula midline.  Eyes:     General: Lids are normal.     Extraocular Movements: Extraocular movements intact.     Conjunctiva/sclera: Conjunctivae normal.     Pupils: Pupils are equal, round, and reactive to light.  Neck:     Thyroid: No thyroid mass, thyromegaly or thyroid tenderness.     Vascular: No carotid bruit or JVD.     Trachea: Trachea and phonation normal.  Cardiovascular:     Rate and Rhythm: Normal rate and regular rhythm.     Chest Wall: PMI is not displaced.     Pulses: Normal pulses.     Heart sounds: Normal heart sounds. No murmur heard.    No friction rub. No gallop.  Pulmonary:     Effort: Pulmonary effort is normal. No respiratory distress.     Breath sounds: Normal breath sounds. No wheezing.  Abdominal:     General: Bowel sounds are normal. There is no distension or abdominal bruit.     Palpations: Abdomen is soft. There  is no hepatomegaly or splenomegaly.     Tenderness: There is no abdominal tenderness. There is no right CVA tenderness or left CVA tenderness.     Hernia:  No hernia is present.  Musculoskeletal:        General: Normal range of motion.     Cervical back: Normal range of motion and neck supple.     Right lower leg: No edema.     Left lower leg: No edema.  Lymphadenopathy:     Cervical: No cervical adenopathy.  Skin:    General: Skin is warm and dry.     Capillary Refill: Capillary refill takes less than 2 seconds.     Coloration: Skin is not cyanotic, jaundiced or pale.     Findings: No rash.  Neurological:     General: No focal deficit present.     Mental Status: She is alert and oriented to person, place, and time.     Sensory: Sensation is intact.     Motor: Motor function is intact.     Coordination: Coordination is intact.     Gait: Gait abnormal (antalgic).     Deep Tendon Reflexes: Reflexes are normal and symmetric.  Psychiatric:        Attention and Perception: Attention and perception normal.        Mood and Affect: Mood and affect normal.        Speech: Speech normal.        Behavior: Behavior normal. Behavior is cooperative.        Thought Content: Thought content normal.        Cognition and Memory: Cognition and memory normal.        Judgment: Judgment normal.     Results for orders placed or performed in visit on 05/09/23  Bayer DCA Hb A1c Waived  Result Value Ref Range   HB A1C (BAYER DCA - WAIVED) 5.4 4.8 - 5.6 %       Pertinent labs & imaging results that were available during my care of the patient were reviewed by me and considered in my medical decision making.  Assessment & Plan:  Sofia was seen today for surgical clearance.  Diagnoses and all orders for this visit:  Preoperative examination Labs pending. Low to moderate risk for surgery dur to underlying comorbid conditions. Paperwork completed in office.  -     CBC with Differential/Platelet -     CMP14+EGFR -     Lipid panel -     Thyroid Panel With TSH -     Bayer DCA Hb A1c Waived  Essential hypertension BP well controlled. Changes were  not made in regimen today. Goal BP is 130/80. Pt aware to report any persistent high or low readings. DASH diet and exercise encouraged. Exercise at least 150 minutes per week and increase as tolerated. Goal BMI > 25. Stress management encouraged. Avoid nicotine and tobacco product use. Avoid excessive alcohol and NSAID's. Avoid more than 2000 mg of sodium daily. Medications as prescribed. Follow up as scheduled.  -     CBC with Differential/Platelet -     CMP14+EGFR -     Lipid panel -     Thyroid Panel With TSH -     Bayer DCA Hb A1c Waived  Vitamin D deficiency Labs pending. Continue repletion therapy. If indicated, will change repletion dosage. Eat foods rich in Vit D including milk, orange juice, yogurt with vitamin D added, salmon or mackerel, canned tuna fish, cereals with  vitamin D added, and cod liver oil. Get out in the sun but make sure to wear at least SPF 30 sunscreen.   Irritable bowel syndrome with constipation Doing great on Linzess, will continue.   Mixed hyperlipidemia Diet encouraged - increase intake of fresh fruits and vegetables, increase intake of lean proteins. Bake, broil, or grill foods. Avoid fried, greasy, and fatty foods. Avoid fast foods. Increase intake of fiber-rich whole grains. Exercise encouraged - at least 150 minutes per week and advance as tolerated.  Goal BMI < 25. Will start medications if warranted.  -     CMP14+EGFR -     Lipid panel     Continue all other maintenance medications.  Follow up plan: As scheduled for chronic follow up   Continue healthy lifestyle choices, including diet (rich in fruits, vegetables, and lean proteins, and low in salt and simple carbohydrates) and exercise (at least 30 minutes of moderate physical activity daily).    The above assessment and management plan was discussed with the patient. The patient verbalized understanding of and has agreed to the management plan. Patient is aware to call the clinic if they  develop any new symptoms or if symptoms persist or worsen. Patient is aware when to return to the clinic for a follow-up visit. Patient educated on when it is appropriate to go to the emergency department.   Kari Baars, FNP-C [facility] [phone]

## 2023-05-10 LAB — CMP14+EGFR
AST: 36 IU/L (ref 0–40)
Albumin/Globulin Ratio: 2.4 — ABNORMAL HIGH (ref 1.2–2.2)
BUN/Creatinine Ratio: 16 (ref 9–23)
BUN: 12 mg/dL (ref 6–24)
Bilirubin Total: 0.8 mg/dL (ref 0.0–1.2)
CO2: 20 mmol/L (ref 20–29)
Calcium: 9.5 mg/dL (ref 8.7–10.2)
Chloride: 105 mmol/L (ref 96–106)
Creatinine, Ser: 0.75 mg/dL (ref 0.57–1.00)
Globulin, Total: 1.9 g/dL (ref 1.5–4.5)
Glucose: 107 mg/dL — ABNORMAL HIGH (ref 70–99)
Sodium: 145 mmol/L — ABNORMAL HIGH (ref 134–144)
Total Protein: 6.4 g/dL (ref 6.0–8.5)
eGFR: 95 mL/min/{1.73_m2} (ref >59)

## 2023-05-10 LAB — LIPID PANEL
Chol/HDL Ratio: 4.9 ratio — ABNORMAL HIGH (ref 0.0–4.4)
Cholesterol, Total: 181 mg/dL (ref 100–199)
HDL: 37 mg/dL — ABNORMAL LOW
LDL Chol Calc (NIH): 113 mg/dL — ABNORMAL HIGH (ref 0–99)
Triglycerides: 176 mg/dL — ABNORMAL HIGH (ref 0–149)
VLDL Cholesterol Cal: 31 mg/dL (ref 5–40)

## 2023-05-10 LAB — CBC WITH DIFFERENTIAL/PLATELET
Basos: 0 %
Hemoglobin: 14.5 g/dL (ref 11.1–15.9)
Immature Grans (Abs): 0 10*3/uL (ref 0.0–0.1)
MCV: 92 fL (ref 79–97)
Monocytes: 8 %
RBC: 4.69 x10E6/uL (ref 3.77–5.28)
RDW: 12.4 % (ref 11.7–15.4)
WBC: 4.5 10*3/uL (ref 3.4–10.8)

## 2023-05-10 LAB — THYROID PANEL WITH TSH
Free Thyroxine Index: 3.3 (ref 1.2–4.9)
T3 Uptake Ratio: 29 % (ref 24–39)
T4, Total: 11.4 ug/dL (ref 4.5–12.0)
TSH: 0.551 u[IU]/mL (ref 0.450–4.500)

## 2023-05-10 MED ORDER — LOSARTAN POTASSIUM 50 MG PO TABS
50.0000 mg | ORAL_TABLET | Freq: Every day | ORAL | 0 refills | Status: DC
Start: 2023-05-10 — End: 2023-08-05

## 2023-05-10 MED ORDER — LINACLOTIDE 72 MCG PO CAPS
72.0000 ug | ORAL_CAPSULE | Freq: Every day | ORAL | 2 refills | Status: DC
Start: 2023-05-10 — End: 2024-09-22

## 2023-05-10 MED ORDER — BUPROPION HCL ER (XL) 300 MG PO TB24
300.0000 mg | ORAL_TABLET | Freq: Every day | ORAL | 0 refills | Status: DC
Start: 2023-05-10 — End: 2023-09-23

## 2023-05-14 ENCOUNTER — Telehealth: Payer: Self-pay | Admitting: Family Medicine

## 2023-05-14 NOTE — Telephone Encounter (Signed)
Lmtcb.

## 2023-05-20 NOTE — Progress Notes (Signed)
COVID Vaccine received:  []  No [x]  Yes Date of any COVID positive Test in last 79 days:No  PCP - Gilford Silvius FNP Cardiologist - No  Chest x-ray - CT 08/02/21  EPIC EKG -  05/23/23 EPIC Stress Test - No ECHO - No Cardiac Cath - No  Bowel Prep - [x]  No  []   Yes ______  Pacemaker / ICD device [x]  No []  Yes   Spinal Cord Stimulator:[x]  No []  Yes       History of Sleep Apnea? []  No [x]  Yes   CPAP used?- [x]  No []  Yes    Does the patient monitor blood sugar?          [x]  No []  Yes  []  N/A  Patient has: [x]  NO Hx DM   []  Pre-DM                 []  DM1  []   DM2 Does patient have a [employer] or Dexacom? [x]  No []  Yes   Fasting Blood Sugar Ranges-  Checks Blood Sugar _____ times a day  GLP1 agonist / usual dose - No GLP1 instructions:  SGLT-2 inhibitors / usual dose - No SGLT-2 instructions:   Blood Thinner / Instructions:No Aspirin Instructions:Prescribed ASA but does not take.  Comments:   Activity level: Patient is able  to climb a flight of stairs without difficulty; [x]  No CP  [x]  No SOB,    Patient can  perform ADLs without assistance.   Anesthesia review: AV Malformation-L knee,L temporal area outside brain.  CVA, Asthma, Sleep apnea-does not use her CPAP  Patient denies shortness of breath, fever, cough and chest pain at PAT appointment.  Patient verbalized understanding and agreement to the Pre-Surgical Instructions that were given to them at this PAT appointment. Patient was also educated of the need to review these PAT instructions again prior to his/her surgery.I reviewed the appropriate phone numbers to call if they have any and questions or concerns.

## 2023-05-21 NOTE — Telephone Encounter (Signed)
Patient states she was seen there today and did not mention a form.

## 2023-05-21 NOTE — Telephone Encounter (Signed)
Pt r/c.

## 2023-05-22 ENCOUNTER — Ambulatory Visit: Payer: BLUE CROSS/BLUE SHIELD | Admitting: Family Medicine

## 2023-05-23 ENCOUNTER — Encounter (HOSPITAL_COMMUNITY)
Admission: RE | Admit: 2023-05-23 | Discharge: 2023-05-23 | Disposition: A | Discharge status: Home / Self Care | Payer: BLUE CROSS/BLUE SHIELD | Source: Ambulatory Visit | Attending: Orthopaedic Surgery | Admitting: Orthopaedic Surgery

## 2023-05-23 ENCOUNTER — Ambulatory Visit: Payer: BLUE CROSS/BLUE SHIELD | Admitting: Family Medicine

## 2023-05-23 ENCOUNTER — Encounter (HOSPITAL_COMMUNITY): Payer: Self-pay

## 2023-05-23 ENCOUNTER — Ambulatory Visit (HOSPITAL_COMMUNITY)
Admission: RE | Admit: 2023-05-23 | Discharge: 2023-05-23 | Disposition: A | Discharge status: Home / Self Care | Payer: BLUE CROSS/BLUE SHIELD | Source: Ambulatory Visit | Attending: Orthopaedic Surgery | Admitting: Orthopaedic Surgery

## 2023-05-23 ENCOUNTER — Other Ambulatory Visit: Payer: Self-pay

## 2023-05-23 VITALS — BP 125/79 | HR 64 | Temp 98.0°F | Resp 16 | Ht 66.0 in | Wt 185.0 lb

## 2023-05-23 DIAGNOSIS — Z01818 Encounter for other preprocedural examination: Secondary | ICD-10-CM | POA: Insufficient documentation

## 2023-05-23 DIAGNOSIS — R001 Bradycardia, unspecified: Secondary | ICD-10-CM | POA: Insufficient documentation

## 2023-05-23 DIAGNOSIS — K760 Fatty (change of) liver, not elsewhere classified: Secondary | ICD-10-CM | POA: Diagnosis not present

## 2023-05-23 DIAGNOSIS — Z86718 Personal history of other venous thrombosis and embolism: Secondary | ICD-10-CM | POA: Diagnosis not present

## 2023-05-23 DIAGNOSIS — F419 Anxiety disorder, unspecified: Secondary | ICD-10-CM | POA: Diagnosis not present

## 2023-05-23 DIAGNOSIS — Z87891 Personal history of nicotine dependence: Secondary | ICD-10-CM | POA: Insufficient documentation

## 2023-05-23 DIAGNOSIS — F32A Depression, unspecified: Secondary | ICD-10-CM | POA: Diagnosis not present

## 2023-05-23 DIAGNOSIS — J45909 Unspecified asthma, uncomplicated: Secondary | ICD-10-CM | POA: Insufficient documentation

## 2023-05-23 DIAGNOSIS — Q279 Congenital malformation of peripheral vascular system, unspecified: Secondary | ICD-10-CM | POA: Diagnosis not present

## 2023-05-23 HISTORY — DX: Thyrotoxicosis, unspecified without thyrotoxic crisis or storm: E05.90

## 2023-05-23 HISTORY — DX: Hypothyroidism, unspecified: E03.9

## 2023-05-23 LAB — SURGICAL PCR SCREEN
MRSA, PCR: NEGATIVE
Staphylococcus aureus: NEGATIVE

## 2023-05-23 NOTE — Patient Instructions (Signed)
SURGICAL WAITING ROOM VISITATION  Patients having surgery or a procedure may have no more than 2 support people in the waiting area - these visitors may rotate.    Children under the age of 78 must have an adult with them who is not the patient.  Due to an increase in RSV and influenza rates and associated hospitalizations, children ages 76 and under may not visit patients in [facility] hospitals.  If the patient needs to stay at the hospital during part of their recovery, the visitor guidelines for inpatient rooms apply. Pre-op nurse will coordinate an appropriate time for 1 support person to accompany patient in pre-op.  This support person may not rotate.    Please refer to the [facility] website for the visitor guidelines for Inpatients (after your surgery is over and you are in a regular room).       Your procedure is scheduled on: 06/04/23   Report to [facility] Main Entrance    Report to admitting at 5:15AM   Call this number if you have problems the morning of surgery [phone]   Do not eat food :After Midnight.   After Midnight you may have the following liquids until 4:30 AM  DAY OF SURGERY  Water Non-Citrus Juices (without pulp, NO RED-Apple, White grape, White cranberry) Black Coffee (NO MILK/CREAM OR CREAMERS, sugar ok)  Clear Tea (NO MILK/CREAM OR CREAMERS, sugar ok) regular and decaf                             Plain Jell-O (NO RED)                                           Fruit ices (not with fruit pulp, NO RED)                                     Popsicles (NO RED)                                                               Sports drinks like Gatorade (NO RED)                  The day of surgery:  Drink ONE (1) Pre-Surgery Clear Ensure  at 4:30 AM the morning of surgery. Drink in one sitting. Do not sip.  This drink was given to you during your hospital  pre-op appointment visit. Nothing else to drink after completing the  Pre-Surgery Clear  Ensure.          If you have questions, please contact your surgeon's office.   Oral Hygiene is also important to reduce your risk of infection.                                    Remember - BRUSH YOUR TEETH THE MORNING OF SURGERY WITH YOUR REGULAR TOOTHPASTE  DENTURES WILL BE REMOVED PRIOR TO SURGERY PLEASE DO NOT APPLY "Poly grip" OR ADHESIVES!!!  Do NOT smoke after Midnight   Take these medicines the morning of surgery with A SIP OF WATER: Wellbutrin, Synthroid  Bring CPAP mask and tubing day of surgery.                              You may not have any metal on your body including hair pins, jewelry, and body piercing             Do not wear make-up, lotions, powders, perfumes, or deodorant  Do not wear nail polish including gel and S&S, artificial/acrylic nails, or any other type of covering on natural nails including finger and toenails. If you have artificial nails, gel coating, etc. that needs to be removed by a nail salon please have this removed prior to surgery or surgery may need to be canceled/ delayed if the surgeon/ anesthesia feels like they are unable to be safely monitored.   Do not shave  48 hours prior to surgery.             .   Do not bring valuables to the hospital. [facility] IS NOT             RESPONSIBLE   FOR VALUABLES.   Contacts, glasses, dentures or bridgework may not be worn into surgery.  DO NOT BRING YOUR HOME MEDICATIONS TO THE HOSPITAL. PHARMACY WILL DISPENSE MEDICATIONS LISTED ON YOUR MEDICATION LIST TO YOU DURING YOUR ADMISSION IN THE HOSPITAL!    Patients discharged on the day of surgery will not be allowed to drive home.  Someone NEEDS to stay with you for the first 24 hours after anesthesia.   Special Instructions: Bring a copy of your healthcare power of attorney and living will documents the day of surgery if you haven't scanned them before.              Please read over the following fact sheets you were given: IF YOU HAVE QUESTIONS  ABOUT YOUR PRE-OP INSTRUCTIONS PLEASE CALL [phone]   If you received a COVID test during your pre-op visit  it is requested that you wear a mask when out in public, stay away from anyone that may not be feeling well and notify your surgeon if you develop symptoms. If you test positive for Covid or have been in contact with anyone that has tested positive in the last 10 days please notify you surgeon.      Pre-operative 5 CHG Bath Instructions   You can play a key role in reducing the risk of infection after surgery. Your skin needs to be as free of germs as possible. You can reduce the number of germs on your skin by washing with CHG (chlorhexidine gluconate) soap before surgery. CHG is an antiseptic soap that kills germs and continues to kill germs even after washing.   DO NOT use if you have an allergy to chlorhexidine/CHG or antibacterial soaps. If your skin becomes reddened or irritated, stop using the CHG and notify one of our RNs at [phone].   Please shower with the CHG soap starting 4 days before surgery using the following schedule:     Please keep in mind the following:  DO NOT shave, including legs and underarms, starting the day of your first shower.   You may shave your face at any point before/day of surgery.  Place clean sheets on your bed the day you start using CHG soap. Use a clean  washcloth (not used since being washed) for each shower. DO NOT sleep with pets once you start using the CHG.   CHG Shower Instructions:  If you choose to wash your hair and private area, wash first with your normal shampoo/soap.  After you use shampoo/soap, rinse your hair and body thoroughly to remove shampoo/soap residue.  Turn the water OFF and apply about 3 tablespoons (45 ml) of CHG soap to a CLEAN washcloth.  Apply CHG soap ONLY FROM YOUR NECK DOWN TO YOUR TOES (washing for 3-5 minutes)  DO NOT use CHG soap on face, private areas, open wounds, or sores.  Pay special attention  to the area where your surgery is being performed.  If you are having back surgery, having someone wash your back for you may be helpful. Wait 2 minutes after CHG soap is applied, then you may rinse off the CHG soap.  Pat dry with a clean towel  Put on clean clothes/pajamas   If you choose to wear lotion, please use ONLY the CHG-compatible lotions on the back of this paper.     Additional instructions for the day of surgery: DO NOT APPLY any lotions, deodorants, cologne, or perfumes.   Put on clean/comfortable clothes.  Brush your teeth.  Ask your nurse before applying any prescription medications to the skin.      CHG Compatible Lotions   Aveeno Moisturizing lotion  Cetaphil Moisturizing Cream  Cetaphil Moisturizing Lotion  Clairol Herbal Essence Moisturizing Lotion, Dry Skin  Clairol Herbal Essence Moisturizing Lotion, Extra Dry Skin  Clairol Herbal Essence Moisturizing Lotion, Normal Skin  Curel Age Defying Therapeutic Moisturizing Lotion with Alpha Hydroxy  Curel Extreme Care Body Lotion  Curel Soothing Hands Moisturizing Hand Lotion  Curel Therapeutic Moisturizing Cream, Fragrance-Free  Curel Therapeutic Moisturizing Lotion, Fragrance-Free  Curel Therapeutic Moisturizing Lotion, Original Formula  Eucerin Daily Replenishing Lotion  Eucerin Dry Skin Therapy Plus Alpha Hydroxy Crme  Eucerin Dry Skin Therapy Plus Alpha Hydroxy Lotion  Eucerin Original Crme  Eucerin Original Lotion  Eucerin Plus Crme Eucerin Plus Lotion  Eucerin TriLipid Replenishing Lotion  Keri Anti-Bacterial Hand Lotion  Keri Deep Conditioning Original Lotion Dry Skin Formula Softly Scented  Keri Deep Conditioning Original Lotion, Fragrance Free Sensitive Skin Formula  Keri Lotion Fast Absorbing Fragrance Free Sensitive Skin Formula  Keri Lotion Fast Absorbing Softly Scented Dry Skin Formula  Keri Original Lotion  Keri Skin Renewal Lotion Keri Silky Smooth Lotion  Keri Silky Smooth Sensitive Skin  Lotion  Nivea Body Creamy Conditioning Oil  Nivea Body Extra Enriched Teacher, adult education Moisturizing Lotion Nivea Crme  Nivea Skin Firming Lotion  NutraDerm 30 Skin Lotion  NutraDerm Skin Lotion  NutraDerm Therapeutic Skin Cream  NutraDerm Therapeutic Skin Lotion  ProShield Protective Hand Cream  Provon moisturizing lotion

## 2023-05-24 ENCOUNTER — Encounter (HOSPITAL_COMMUNITY): Payer: Self-pay

## 2023-05-24 NOTE — Anesthesia Preprocedure Evaluation (Addendum)
Anesthesia Evaluation  Patient identified by MRN, date of birth, ID band Patient awake    Reviewed: Allergy & Precautions, NPO status , Patient's Chart, lab work & pertinent test results  Airway Mallampati: II  TM Distance: >3 FB Neck ROM: Full    Dental no notable dental hx.    Pulmonary asthma , sleep apnea and Continuous Positive Airway Pressure Ventilation , former smoker   Pulmonary exam normal        Cardiovascular hypertension, Pt. on medications + DVT   Rhythm:Regular Rate:Normal     Neuro/Psych  Headaches, Seizures -, Well Controlled,   Anxiety Depression       GI/Hepatic Neg liver ROS,GERD  ,,  Endo/Other  Hypothyroidism    Renal/GU negative Renal ROS  negative genitourinary   Musculoskeletal  (+) Arthritis , Osteoarthritis,    Abdominal Normal abdominal exam  (+)   Peds  Hematology Lab Results      Component                Value               Date                      WBC                      4.5                 05/09/2023                HGB                      14.5                05/09/2023                HCT                      43.1                05/09/2023                MCV                      92                  05/09/2023                PLT                      164                 05/09/2023             Lab Results      Component                Value               Date                      NA                       145 (H)             05/09/2023                K  4.5                 05/09/2023                CO2                      20                  05/09/2023                GLUCOSE                  107 (H)             05/09/2023                BUN                      12                  05/09/2023                CREATININE               0.75                05/09/2023                CALCIUM                  9.5                 05/09/2023                EGFR                      95                  05/09/2023                GFRNONAA                 >60                 07/29/2021              Anesthesia Other Findings   Reproductive/Obstetrics                             Anesthesia Physical Anesthesia Plan  ASA: 3  Anesthesia Plan: MAC, Regional and Spinal   Post-op Pain Management: Regional block*   Induction: Intravenous  PONV Risk Score and Plan: 2 and Ondansetron, Dexamethasone, Propofol infusion, Midazolam and Treatment may vary due to age or medical condition  Airway Management Planned: Simple Face Mask and Nasal Cannula  Additional Equipment: None  Intra-op Plan:   Post-operative Plan:   Informed Consent: I have reviewed the patients History and Physical, chart, labs and discussed the procedure including the risks, benefits and alternatives for the proposed anesthesia with the patient or authorized representative who has indicated his/her understanding and acceptance.     Dental advisory given  Plan Discussed with: CRNA  Anesthesia Plan Comments: (See PAT note from 6/13 by K Gekas PA-C. Discussed with Dr. Malen Gauze )        Anesthesia Quick Evaluation

## 2023-05-24 NOTE — Progress Notes (Signed)
Case: [id number] Date/Time: 06/04/23 0715   Procedure: LEFT TOTAL KNEE ARTHROPLASTY (Left: Knee)   Anesthesia type: Spinal   Pre-op diagnosis: LEFT KNEE DEGENERATIVE JOINT DISEASE   Location: [facility] 06 / [facility]   Surgeons: Marcene Corning, MD       DISCUSSION: Olivia Zamora is a 52 yo female who presents to PAT prior to surgery above. PMH significant for former smoking, asthma, anxiety/depression, seizures, remote hx of dvt (not anticoagulated), hx of complicated migraines, hx of AVMs  Patient was referred to [facility] for a temporalis AVM found incidentally on MRI brain w/wo contrast on 04/06/22. This was obtained to rule out a CVA in a patient w hx of complex migraines. She reported symptoms "a multiple year history of worsening constellation of symptoms including worsening memory, fatigue, weakness of the arms and legs, shortness of breath with exertion, worsening gait, difficulty with ADLs, headache." MRI and MRA of the thoracic/lumbar spine were ordered to r/o an underlying vascular lesion in the spine which may be causing her gait disturbance. Unfortunately this was not done due to being cost prohibitive.   Discussed case with Dr. Malen Gauze who recommends patient is evaluated DOS for appropriateness of spinal anesthesia vs general. All other chronic medical issues appear to be stable.  VS: BP 125/79   Pulse 64   Temp 36.7 C (Oral)   Resp 16   Ht 5\' 6"  (1.676 m)   Wt 83.9 kg   SpO2 98%   BMI 29.86 kg/m   PROVIDERS: Sonny Masters, FNP   LABS: Labs reviewed: Acceptable for surgery. (all labs ordered are listed, but only abnormal results are displayed)  Labs Reviewed  SURGICAL PCR SCREEN     IMAGES:  MRI brain w/wo contrast 04/06/22:  IMPRESSION: 1. No acute intracranial abnormality. Normal for age non contrast MRI appearance of the brain. No evidence of demyelinating disease.   2. 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  CTA chest 08/02/21:  IMPRESSION: 1. No acute PE or thoracic aortic dissection. 2. Fatty liver  EKG 05/23/23:  Sinus bradycardia, rate 58   CV: n/a  Past Medical History:  Diagnosis Date   Anxiety    Failed therapy with Paxil, Lexparo, Prozac, Wellbutrin, Xanax, and Ativan   Arthritis    Asthma    Cardiac disease    Angina   Chronic back pain    Constipation 08/26/2019   Depression    Failed therapy with Paxil, Lexparo, Prozac, Wellbutrin, and Ativan   Disease of thyroid gland 12/28/2011   multiple thyroid nodules in 2013 - [place] on 03/08/14 showed heterogeneous appearance of thyroid gland without focal nodule   Eczema    Generalized seizure (HCC)    stress related   GERD (gastroesophageal reflux disease)    History of DVT (deep vein thrombosis) 1988   Hypertension    Hyperthyroidism    Hypothyroidism    IBS (irritable bowel syndrome)    Internal derangement of left knee    Lactose intolerance    Migraine    Sleep apnea    wears CPAP   Vitamin D deficiency     Past Surgical History:  Procedure Laterality Date   CESAREAN SECTION     x3   COLONOSCOPY     HEMANGIOMA EXCISION Right    shoulder  IR RADIOLOGIST EVAL & MGMT  05/04/2021   MASS EXCISION Right    lump from palm of hand   OOPHORECTOMY     OTHER SURGICAL HISTORY Right    Repair of tib-fib fracture   TOTAL ABDOMINAL HYSTERECTOMY     TUBAL LIGATION     TUMOR EXCISION Right    Axilla - benign   VASCULAR SURGERY Right 1989   blood clot removed from neck    MEDICATIONS:  albuterol (ACCUNEB) 0.63 MG/3ML nebulizer solution   aspirin EC 81 MG tablet   buPROPion (WELLBUTRIN XL) 300 MG 24 hr tablet   docusate sodium (COLACE) 100 MG capsule   fluticasone (FLONASE) 50 MCG/ACT nasal spray   Fremanezumab-vfrm (AJOVY) 225  MG/1.5ML SOAJ   linaclotide (LINZESS) 72 MCG capsule   losartan (COZAAR) 50 MG tablet   Multiple Vitamins-Minerals (MULTIVITAMIN ADULT PO)   Rimegepant Sulfate (NURTEC) 75 MG TBDP   SYNTHROID 100 MCG tablet   Tiotropium Bromide Monohydrate (SPIRIVA RESPIMAT) 1.25 MCG/ACT AERS   Vitamin D, Ergocalciferol, (DRISDOL) 1.25 MG (50000 UT) CAPS capsule   zolpidem (AMBIEN) 5 MG tablet   No current facility-administered medications for this encounter.   Marcille Blanco [facility] Surgical Short Stay/Anesthesiology [facility] Phone [phone] 05/24/2023 2:53 PM

## 2023-06-03 NOTE — H&P (Signed)
TOTAL KNEE ADMISSION H&P  Patient is being admitted for left total knee arthroplasty.  Subjective:  Chief Complaint:left knee pain.  HPI: Olivia Zamora, 52 y.o. female, has a history of pain and functional disability in the left knee due to arthritis and has failed non-surgical conservative treatments for greater than 12 weeks to includeNSAID's and/or analgesics, corticosteriod injections, flexibility and strengthening excercises, use of assistive devices, weight reduction as appropriate, and activity modification.  Onset of symptoms was gradual, starting 5 years ago with gradually worsening course since that time. The patient noted no past surgery on the left knee(s).  Patient currently rates pain in the left knee(s) at 10 out of 10 with activity. Patient has night pain, worsening of pain with activity and weight bearing, pain that interferes with activities of daily living, crepitus, and joint swelling.  Patient has evidence of subchondral cysts, subchondral sclerosis, periarticular osteophytes, and joint space narrowing by imaging studies. There is no active infection.  Patient Active Problem List   Diagnosis Date Noted   Mixed hyperlipidemia 05/09/2023   Essential hypertension 11/21/2022   Primary insomnia 11/21/2022   Controlled substance agreement signed 11/21/2022   Acquired hypothyroidism 11/21/2022   H/O: CVA (cerebrovascular accident) 08/01/2021   History of DVT (deep vein thrombosis) 08/01/2021   Symptomatic mammary hypertrophy 06/13/2021   IBS (irritable bowel syndrome) 08/26/2019   Vitamin D deficiency    Sleep apnea    Depression    Anxiety    Asthma    Disease of thyroid gland 12/28/2011   Past Medical History:  Diagnosis Date   Anxiety    Failed therapy with Paxil, Lexparo, Prozac, Wellbutrin, Xanax, and Ativan   Arthritis    Asthma    Cardiac disease    Angina   Chronic back pain    Constipation 08/26/2019   Depression    Failed therapy with Paxil, Lexparo,  Prozac, Wellbutrin, and Ativan   Disease of thyroid gland 12/28/2011   multiple thyroid nodules in 2013 - [place] on 03/08/14 showed heterogeneous appearance of thyroid gland without focal nodule   Eczema    Generalized seizure (HCC)    stress related   GERD (gastroesophageal reflux disease)    History of DVT (deep vein thrombosis) 1988   Hypertension    Hyperthyroidism    Hypothyroidism    IBS (irritable bowel syndrome)    Internal derangement of left knee    Lactose intolerance    Migraine    Sleep apnea    wears CPAP   Vitamin D deficiency     Past Surgical History:  Procedure Laterality Date   CESAREAN SECTION     x3   COLONOSCOPY     HEMANGIOMA EXCISION Right    shoulder   IR RADIOLOGIST EVAL & MGMT  05/04/2021   MASS EXCISION Right    lump from palm of hand   OOPHORECTOMY     OTHER SURGICAL HISTORY Right    Repair of tib-fib fracture   TOTAL ABDOMINAL HYSTERECTOMY     TUBAL LIGATION     TUMOR EXCISION Right    Axilla - benign   VASCULAR SURGERY Right 1989   blood clot removed from neck    No current facility-administered medications for this encounter.   Current Outpatient Medications  Medication Sig Dispense Refill Last Dose   albuterol (ACCUNEB) 0.63 MG/3ML nebulizer solution Take 3 mLs (0.63 mg total) by nebulization every 6 (six) hours as needed for wheezing. (Patient taking differently: Take 1 ampule by nebulization every  6 (six) hours as needed for wheezing (allergy).) 75 mL 1    aspirin EC 81 MG tablet Take 81 mg by mouth daily.      buPROPion (WELLBUTRIN XL) 300 MG 24 hr tablet Take 1 tablet (300 mg total) by mouth daily. 90 tablet 0    docusate sodium (COLACE) 100 MG capsule Take 1 capsule (100 mg total) by mouth daily. (Patient taking differently: Take 100 mg by mouth 2 (two) times daily.) 90 capsule 1    fluticasone (FLONASE) 50 MCG/ACT nasal spray Place 1 spray into both nostrils 2 (two) times daily.      Fremanezumab-vfrm (AJOVY) 225 MG/1.5ML SOAJ INJECT  1.5ML INTO THE SKIN EVERY 28 DAYS 1.5 mL 4    linaclotide (LINZESS) 72 MCG capsule Take 1 capsule (72 mcg total) by mouth daily before breakfast. 90 capsule 2    losartan (COZAAR) 50 MG tablet Take 1 tablet (50 mg total) by mouth daily. 90 tablet 0    Multiple Vitamins-Minerals (MULTIVITAMIN ADULT PO) Take 1 tablet by mouth daily.      Rimegepant Sulfate (NURTEC) 75 MG TBDP Take 75 mg by mouth daily as needed. 8 tablet 6    SYNTHROID 100 MCG tablet Take 100 mcg by mouth every other day.      Tiotropium Bromide Monohydrate (SPIRIVA RESPIMAT) 1.25 MCG/ACT AERS Inhale 2 puffs into the lungs daily. (Patient taking differently: Inhale 2 puffs into the lungs 2 (two) times daily.) 4 g 11    Vitamin D, Ergocalciferol, (DRISDOL) 1.25 MG (50000 UT) CAPS capsule Take 50,000 Units by mouth every 7 (seven) days.      zolpidem (AMBIEN) 5 MG tablet Take 1 tablet (5 mg total) by mouth at bedtime as needed for sleep. 30 tablet 5    Allergies  Allergen Reactions   Iodine Anaphylaxis   Iohexol Anaphylaxis    Pre meds given in past with no reaction noted.     Latex Anaphylaxis and Other (See Comments)   Topamax [Topiramate] Anaphylaxis    Social History   Tobacco Use   Smoking status: Former    Types: Cigarettes   Smokeless tobacco: Never  Substance Use Topics   Alcohol use: Never    Family History  Problem Relation Age of Onset   Other Niece        antiphospholipid AB syndrome   Factor V Leiden deficiency Sister    CVA Sister        30s   Thyroid disease Sister    Breast cancer Sister    Multiple sclerosis Sister    Other Sister        guillain barre   Depression Sister    Migraines Sister    Arthritis Mother    Hypertension Mother    Thyroid disease Mother    Depression Mother    Migraines Mother    Arthritis Father    Hypertension Father    Depression Father    Hypertension Maternal Grandmother    Depression Maternal Grandmother    Anxiety disorder Maternal Grandmother     Hypertension Maternal Grandfather    Hypertension Paternal Grandmother    Hypertension Paternal Grandfather    CVA Sister    Thyroid disease Sister    Depression Sister    Migraines Sister    Asthma Daughter    Depression Daughter    Anxiety disorder Daughter    Irritable bowel syndrome Daughter    Lactose intolerance Daughter    Migraines Daughter    Asthma  Son    Depression Son    Anxiety disorder Son    Irritable bowel syndrome Son    Lactose intolerance Son    Migraines Son    Asthma Son    Depression Son    Anxiety disorder Son    Irritable bowel syndrome Son    Lactose intolerance Son    Migraines Son      Review of Systems  Musculoskeletal:  Positive for arthralgias.       Left knee  All other systems reviewed and are negative.   Objective:  Physical Exam Constitutional:      Appearance: Normal appearance.  HENT:     Head: Normocephalic and atraumatic.     Nose: Nose normal.     Mouth/Throat:     Pharynx: Oropharynx is clear.  Eyes:     Extraocular Movements: Extraocular movements intact.  Pulmonary:     Effort: Pulmonary effort is normal.  Abdominal:     Palpations: Abdomen is soft.  Musculoskeletal:     Cervical back: Normal range of motion.     Comments: Left knee exam is the same with at most trace effusion and motion from 0-130 at which point she has pain.  She has medial joint line pain and crepitation.  Ligaments are stable.  Hip motion is full  Skin:    General: Skin is warm and dry.  Neurological:     General: No focal deficit present.     Mental Status: She is alert and oriented to person, place, and time. Mental status is at baseline.  Psychiatric:        Mood and Affect: Mood normal.        Behavior: Behavior normal.        Thought Content: Thought content normal.        Judgment: Judgment normal.     Vital signs in last 24 hours:    Labs:   Estimated body mass index is 29.86 kg/m as calculated from the following:   Height  as of 05/23/23: 5\' 6"  (1.676 m).   Weight as of 05/23/23: 83.9 kg.   Imaging Review Plain radiographs demonstrate severe degenerative joint disease of the left knee(s). The overall alignment isneutral. The bone quality appears to be good for age and reported activity level.      Assessment/Plan:  End stage primary arthritis, left knee   The patient history, physical examination, clinical judgment of the provider and imaging studies are consistent with end stage degenerative joint disease of the left knee(s) and total knee arthroplasty is deemed medically necessary. The treatment options including medical management, injection therapy arthroscopy and arthroplasty were discussed at length. The risks and benefits of total knee arthroplasty were presented and reviewed. The risks due to aseptic loosening, infection, stiffness, patella tracking problems, thromboembolic complications and other imponderables were discussed. The patient acknowledged the explanation, agreed to proceed with the plan and consent was signed. Patient is being admitted for inpatient treatment for surgery, pain control, PT, OT, prophylactic antibiotics, VTE prophylaxis, progressive ambulation and ADL's and discharge planning. The patient is planning to be discharged home with home health services

## 2023-06-04 ENCOUNTER — Encounter (HOSPITAL_COMMUNITY)
Admission: RE | Disposition: A | Discharge status: Home / Self Care | Payer: Self-pay | Source: Ambulatory Visit | Attending: Orthopaedic Surgery

## 2023-06-04 ENCOUNTER — Ambulatory Visit (HOSPITAL_COMMUNITY): Payer: BLUE CROSS/BLUE SHIELD | Admitting: Anesthesiology

## 2023-06-04 ENCOUNTER — Ambulatory Visit (HOSPITAL_COMMUNITY)
Admission: RE | Admit: 2023-06-04 | Discharge: 2023-06-04 | Disposition: A | Discharge status: Home / Self Care | Payer: BLUE CROSS/BLUE SHIELD | Source: Ambulatory Visit | Attending: Orthopaedic Surgery | Admitting: Orthopaedic Surgery

## 2023-06-04 ENCOUNTER — Other Ambulatory Visit: Payer: Self-pay

## 2023-06-04 ENCOUNTER — Ambulatory Visit (HOSPITAL_COMMUNITY): Payer: BLUE CROSS/BLUE SHIELD | Admitting: Medical

## 2023-06-04 ENCOUNTER — Encounter (HOSPITAL_COMMUNITY): Payer: Self-pay | Admitting: Orthopaedic Surgery

## 2023-06-04 DIAGNOSIS — M1712 Unilateral primary osteoarthritis, left knee: Secondary | ICD-10-CM | POA: Insufficient documentation

## 2023-06-04 HISTORY — PX: TOTAL KNEE ARTHROPLASTY: SHX125

## 2023-06-04 SURGERY — ARTHROPLASTY, KNEE, TOTAL
Anesthesia: Monitor Anesthesia Care | Site: Knee | Laterality: Left

## 2023-06-04 MED ORDER — TRANEXAMIC ACID 1000 MG/10ML IV SOLN
2000.0000 mg | INTRAVENOUS | Status: DC
  Filled 2023-06-04: qty 20

## 2023-06-04 MED ORDER — AMISULPRIDE (ANTIEMETIC) 5 MG/2ML IV SOLN
INTRAVENOUS | Status: AC
  Filled 2023-06-04: qty 4

## 2023-06-04 MED ORDER — DEXAMETHASONE SODIUM PHOSPHATE 10 MG/ML IJ SOLN
INTRAMUSCULAR | Status: AC
  Filled 2023-06-04: qty 1

## 2023-06-04 MED ORDER — GLYCOPYRROLATE 0.2 MG/ML IJ SOLN
INTRAMUSCULAR | Status: AC
  Filled 2023-06-04: qty 1

## 2023-06-04 MED ORDER — LACTATED RINGERS IV BOLUS
500.0000 mL | Freq: Once | INTRAVENOUS | Status: AC
  Administered 2023-06-04: 500 mL via INTRAVENOUS

## 2023-06-04 MED ORDER — METHOCARBAMOL 500 MG PO TABS: 500.0000 mg | ORAL_TABLET | Freq: Four times a day (QID) | ORAL | Status: DC | PRN

## 2023-06-04 MED ORDER — LACTATED RINGERS IV BOLUS: 250.0000 mL | Freq: Once | INTRAVENOUS | Status: DC

## 2023-06-04 MED ORDER — LACTATED RINGERS IV BOLUS
250.0000 mL | Freq: Once | INTRAVENOUS | Status: AC
  Administered 2023-06-04: 250 mL via INTRAVENOUS

## 2023-06-04 MED ORDER — TRANEXAMIC ACID-NACL 1000-0.7 MG/100ML-% IV SOLN
INTRAVENOUS | Status: AC
  Filled 2023-06-04: qty 100

## 2023-06-04 MED ORDER — AMISULPRIDE (ANTIEMETIC) 5 MG/2ML IV SOLN
10.0000 mg | Freq: Once | INTRAVENOUS | Status: AC | PRN
  Administered 2023-06-04: 10 mg via INTRAVENOUS

## 2023-06-04 MED ORDER — STERILE WATER FOR IRRIGATION IR SOLN
Status: DC | PRN
  Administered 2023-06-04: 2000 mL

## 2023-06-04 MED ORDER — HYDROCODONE-ACETAMINOPHEN 5-325 MG PO TABS
1.0000 | ORAL_TABLET | ORAL | Status: DC | PRN
  Administered 2023-06-04: 1 via ORAL

## 2023-06-04 MED ORDER — HYDROCODONE-ACETAMINOPHEN 7.5-325 MG PO TABS: 1.0000 | ORAL_TABLET | ORAL | Status: DC | PRN

## 2023-06-04 MED ORDER — DEXAMETHASONE SODIUM PHOSPHATE 4 MG/ML IJ SOLN
INTRAMUSCULAR | Status: DC | PRN
  Administered 2023-06-04: 8 mg via INTRAVENOUS

## 2023-06-04 MED ORDER — FENTANYL CITRATE PF 50 MCG/ML IJ SOSY
PREFILLED_SYRINGE | INTRAMUSCULAR | Status: AC
  Administered 2023-06-04: 50 ug via INTRAVENOUS
  Filled 2023-06-04: qty 1

## 2023-06-04 MED ORDER — TIZANIDINE HCL 4 MG PO TABS: 4.0000 mg | ORAL_TABLET | Freq: Four times a day (QID) | ORAL | 1 refills | Status: DC | PRN

## 2023-06-04 MED ORDER — DEXMEDETOMIDINE HCL IN NACL 80 MCG/20ML IV SOLN
INTRAVENOUS | Status: AC
  Filled 2023-06-04: qty 20

## 2023-06-04 MED ORDER — PHENYLEPHRINE 80 MCG/ML (10ML) SYRINGE FOR IV PUSH (FOR BLOOD PRESSURE SUPPORT)
PREFILLED_SYRINGE | INTRAVENOUS | Status: AC
  Filled 2023-06-04: qty 20

## 2023-06-04 MED ORDER — KETOROLAC TROMETHAMINE 15 MG/ML IJ SOLN
INTRAMUSCULAR | Status: AC
  Administered 2023-06-04: 15 mg via INTRAVENOUS
  Filled 2023-06-04: qty 1

## 2023-06-04 MED ORDER — EPINEPHRINE PF 1 MG/ML IJ SOLN
INTRAMUSCULAR | Status: AC
  Filled 2023-06-04: qty 1

## 2023-06-04 MED ORDER — PHENYLEPHRINE HCL (PRESSORS) 10 MG/ML IV SOLN
INTRAVENOUS | Status: AC
  Filled 2023-06-04: qty 1

## 2023-06-04 MED ORDER — ACETAMINOPHEN 500 MG PO TABS
500.0000 mg | ORAL_TABLET | Freq: Four times a day (QID) | ORAL | Status: DC
  Administered 2023-06-04: 500 mg via ORAL

## 2023-06-04 MED ORDER — PHENYLEPHRINE HCL-NACL 20-0.9 MG/250ML-% IV SOLN
INTRAVENOUS | Status: AC
  Filled 2023-06-04: qty 250

## 2023-06-04 MED ORDER — ONDANSETRON HCL 4 MG/2ML IJ SOLN
INTRAMUSCULAR | Status: AC
  Filled 2023-06-04: qty 2

## 2023-06-04 MED ORDER — PHENYLEPHRINE HCL (PRESSORS) 10 MG/ML IV SOLN
INTRAVENOUS | Status: DC | PRN
  Administered 2023-06-04 (×4): 160 ug via INTRAVENOUS

## 2023-06-04 MED ORDER — METHOCARBAMOL 500 MG IVPB - SIMPLE MED
INTRAVENOUS | Status: AC
  Filled 2023-06-04: qty 55

## 2023-06-04 MED ORDER — PHENYLEPHRINE HCL-NACL 20-0.9 MG/250ML-% IV SOLN
INTRAVENOUS | Status: DC | PRN
  Administered 2023-06-04: 50 ug/min via INTRAVENOUS

## 2023-06-04 MED ORDER — SODIUM CHLORIDE (PF) 0.9 % IJ SOLN
INTRAMUSCULAR | Status: DC | PRN
  Administered 2023-06-04: 30 mL

## 2023-06-04 MED ORDER — KETOROLAC TROMETHAMINE 15 MG/ML IJ SOLN: 15.0000 mg | Freq: Four times a day (QID) | INTRAMUSCULAR | Status: DC

## 2023-06-04 MED ORDER — BUPIVACAINE LIPOSOME 1.3 % IJ SUSP
INTRAMUSCULAR | Status: AC
  Filled 2023-06-04: qty 20

## 2023-06-04 MED ORDER — BUPIVACAINE IN DEXTROSE 0.75-8.25 % IT SOLN
INTRATHECAL | Status: DC | PRN
  Administered 2023-06-04: 1.6 mL via INTRATHECAL

## 2023-06-04 MED ORDER — 0.9 % SODIUM CHLORIDE (POUR BTL) OPTIME
TOPICAL | Status: DC | PRN
  Administered 2023-06-04: 1000 mL

## 2023-06-04 MED ORDER — ASPIRIN EC 81 MG PO TBEC: 81.0000 mg | DELAYED_RELEASE_TABLET | Freq: Two times a day (BID) | ORAL | 11 refills | Status: DC

## 2023-06-04 MED ORDER — BUPIVACAINE HCL (PF) 0.5 % IJ SOLN
INTRAMUSCULAR | Status: AC
  Filled 2023-06-04: qty 30

## 2023-06-04 MED ORDER — BUPIVACAINE-EPINEPHRINE 0.5% -1:200000 IJ SOLN
INTRAMUSCULAR | Status: DC | PRN
  Administered 2023-06-04: 30 mL

## 2023-06-04 MED ORDER — DEXMEDETOMIDINE HCL IN NACL 80 MCG/20ML IV SOLN
INTRAVENOUS | Status: DC | PRN
  Administered 2023-06-04: 16 ug via INTRAVENOUS

## 2023-06-04 MED ORDER — HYDROCODONE-ACETAMINOPHEN 5-325 MG PO TABS: 1.0000 | ORAL_TABLET | Freq: Four times a day (QID) | ORAL | 0 refills | Status: DC | PRN

## 2023-06-04 MED ORDER — FENTANYL CITRATE PF 50 MCG/ML IJ SOSY
50.0000 ug | PREFILLED_SYRINGE | INTRAMUSCULAR | Status: AC
  Administered 2023-06-04: 100 ug via INTRAVENOUS
  Filled 2023-06-04: qty 2

## 2023-06-04 MED ORDER — GLYCOPYRROLATE 0.2 MG/ML IJ SOLN
INTRAMUSCULAR | Status: DC | PRN
  Administered 2023-06-04: .2 mg via INTRAVENOUS

## 2023-06-04 MED ORDER — ACETAMINOPHEN 10 MG/ML IV SOLN: 1000.0000 mg | Freq: Once | INTRAVENOUS | Status: DC | PRN

## 2023-06-04 MED ORDER — TRANEXAMIC ACID-NACL 1000-0.7 MG/100ML-% IV SOLN
1000.0000 mg | Freq: Once | INTRAVENOUS | Status: AC
  Administered 2023-06-04: 1000 mg via INTRAVENOUS

## 2023-06-04 MED ORDER — PROPOFOL 1000 MG/100ML IV EMUL
INTRAVENOUS | Status: AC
  Filled 2023-06-04: qty 100

## 2023-06-04 MED ORDER — POVIDONE-IODINE 10 % EX SWAB: 2.0000 | Freq: Once | CUTANEOUS | Status: DC

## 2023-06-04 MED ORDER — ROPIVACAINE HCL 7.5 MG/ML IJ SOLN
INTRAMUSCULAR | Status: DC | PRN
  Administered 2023-06-04: 20 mL via PERINEURAL

## 2023-06-04 MED ORDER — SODIUM CHLORIDE (PF) 0.9 % IJ SOLN
INTRAMUSCULAR | Status: AC
  Filled 2023-06-04: qty 50

## 2023-06-04 MED ORDER — CEFAZOLIN SODIUM-DEXTROSE 2-4 GM/100ML-% IV SOLN: 2.0000 g | Freq: Four times a day (QID) | INTRAVENOUS | Status: DC

## 2023-06-04 MED ORDER — HYDROCODONE-ACETAMINOPHEN 5-325 MG PO TABS
ORAL_TABLET | ORAL | Status: AC
  Filled 2023-06-04: qty 1

## 2023-06-04 MED ORDER — ACETAMINOPHEN 500 MG PO TABS
ORAL_TABLET | ORAL | Status: AC
  Filled 2023-06-04: qty 2

## 2023-06-04 MED ORDER — METHOCARBAMOL 500 MG IVPB - SIMPLE MED
500.0000 mg | Freq: Four times a day (QID) | INTRAVENOUS | Status: DC | PRN
  Administered 2023-06-04: 500 mg via INTRAVENOUS

## 2023-06-04 MED ORDER — BUPIVACAINE LIPOSOME 1.3 % IJ SUSP: 20.0000 mL | Freq: Once | INTRAMUSCULAR | Status: DC

## 2023-06-04 MED ORDER — MIDAZOLAM HCL 2 MG/2ML IJ SOLN
1.0000 mg | INTRAMUSCULAR | Status: AC
  Administered 2023-06-04: 2 mg via INTRAVENOUS
  Filled 2023-06-04: qty 2

## 2023-06-04 MED ORDER — LACTATED RINGERS IV SOLN: INTRAVENOUS | Status: DC

## 2023-06-04 MED ORDER — TRANEXAMIC ACID 1000 MG/10ML IV SOLN
INTRAVENOUS | Status: DC | PRN
  Administered 2023-06-04: 2000 mg via TOPICAL

## 2023-06-04 MED ORDER — FENTANYL CITRATE PF 50 MCG/ML IJ SOSY
PREFILLED_SYRINGE | INTRAMUSCULAR | Status: AC
  Filled 2023-06-04: qty 1

## 2023-06-04 MED ORDER — TRANEXAMIC ACID-NACL 1000-0.7 MG/100ML-% IV SOLN
1000.0000 mg | INTRAVENOUS | Status: AC
  Administered 2023-06-04: 1000 mg via INTRAVENOUS
  Filled 2023-06-04: qty 100

## 2023-06-04 MED ORDER — BUPIVACAINE LIPOSOME 1.3 % IJ SUSP
INTRAMUSCULAR | Status: DC | PRN
  Administered 2023-06-04: 20 mL

## 2023-06-04 MED ORDER — ONDANSETRON HCL 4 MG/2ML IJ SOLN
INTRAMUSCULAR | Status: DC | PRN
  Administered 2023-06-04: 4 mg via INTRAVENOUS

## 2023-06-04 MED ORDER — FENTANYL CITRATE PF 50 MCG/ML IJ SOSY
25.0000 ug | PREFILLED_SYRINGE | INTRAMUSCULAR | Status: DC | PRN
  Administered 2023-06-04: 50 ug via INTRAVENOUS

## 2023-06-04 MED ORDER — CEFAZOLIN SODIUM-DEXTROSE 2-4 GM/100ML-% IV SOLN
INTRAVENOUS | Status: AC
  Administered 2023-06-04: 2 g via INTRAVENOUS
  Filled 2023-06-04: qty 100

## 2023-06-04 MED ORDER — PROPOFOL 500 MG/50ML IV EMUL
INTRAVENOUS | Status: DC | PRN
  Administered 2023-06-04: 100 ug/kg/min via INTRAVENOUS

## 2023-06-04 MED ORDER — CEFAZOLIN SODIUM-DEXTROSE 2-4 GM/100ML-% IV SOLN
2.0000 g | INTRAVENOUS | Status: AC
  Administered 2023-06-04: 2 g via INTRAVENOUS
  Filled 2023-06-04: qty 100

## 2023-06-04 SURGICAL SUPPLY — 58 items
ATTUNE PS FEM LT SZ 4 CEM KNEE (Femur) IMPLANT
ATTUNE PSRP INSR SZ4 5 KNEE (Insert) IMPLANT
BAG COUNTER SPONGE SURGICOUNT (BAG) ×1 IMPLANT
BAG DECANTER FOR FLEXI CONT (MISCELLANEOUS) ×1 IMPLANT
BAG SPEC THK2 15X12 ZIP CLS (MISCELLANEOUS) ×1
BAG SPNG CNTER NS LX DISP (BAG) ×1
BAG ZIPLOCK 12X15 (MISCELLANEOUS) ×1 IMPLANT
BASE TIBIAL ROT PLAT SZ 5 KNEE (Knees) IMPLANT
BLADE SAGITTAL 25.0X1.19X90 (BLADE) ×1 IMPLANT
BLADE SAW SGTL 11.0X1.19X90.0M (BLADE) ×1 IMPLANT
BLADE SURG SZ10 CARB STEEL (BLADE) ×1 IMPLANT
BNDG CMPR 5X62 HK CLSR LF (GAUZE/BANDAGES/DRESSINGS) ×1
BNDG ELASTIC 6INX 5YD STR LF (GAUZE/BANDAGES/DRESSINGS) ×1 IMPLANT
BOOTIES KNEE HIGH SLOAN (MISCELLANEOUS) ×1 IMPLANT
BOWL SMART MIX CTS (DISPOSABLE) ×1 IMPLANT
BSPLAT TIB 5 CMNT ROT PLAT STR (Knees) ×1 IMPLANT
CEMENT HV SMART SET (Cement) ×2 IMPLANT
COVER SURGICAL LIGHT HANDLE (MISCELLANEOUS) ×1 IMPLANT
CUFF TOURN SGL QUICK 34 (TOURNIQUET CUFF) ×1
CUFF TRNQT CYL 34X4.125X (TOURNIQUET CUFF) ×1 IMPLANT
DRAPE TOP 10253 STERILE (DRAPES) ×1 IMPLANT
DRAPE U-SHAPE 47X51 STRL (DRAPES) ×1 IMPLANT
DRSG AQUACEL AG ADV 3.5X10 (GAUZE/BANDAGES/DRESSINGS) ×1 IMPLANT
DURAPREP 26ML APPLICATOR (WOUND CARE) ×2 IMPLANT
ELECT REM PT RETURN 15FT ADLT (MISCELLANEOUS) ×1 IMPLANT
GLOVE BIO SURGEON STRL SZ8 (GLOVE) ×2 IMPLANT
GLOVE BIOGEL PI IND STRL 7.0 (GLOVE) ×1 IMPLANT
GLOVE BIOGEL PI IND STRL 8 (GLOVE) ×2 IMPLANT
GLOVE SURG SYN 7.0 (GLOVE) ×1 IMPLANT
GLOVE SURG SYN 7.0 PF PI (GLOVE) ×1 IMPLANT
GOWN SRG XL LVL 4 BRTHBL STRL (GOWNS) ×1 IMPLANT
GOWN STRL NON-REIN XL LVL4 (GOWNS) ×1
GOWN STRL REUS W/ TWL XL LVL3 (GOWN DISPOSABLE) ×2 IMPLANT
GOWN STRL REUS W/TWL XL LVL3 (GOWN DISPOSABLE) ×2
HANDPIECE INTERPULSE COAX TIP (DISPOSABLE) ×1
HOLDER FOLEY CATH W/STRAP (MISCELLANEOUS) IMPLANT
HOOD PEEL AWAY T7 (MISCELLANEOUS) ×3 IMPLANT
KIT TURNOVER KIT A (KITS) IMPLANT
MANIFOLD NEPTUNE II (INSTRUMENTS) ×1 IMPLANT
NS IRRIG 1000ML POUR BTL (IV SOLUTION) ×1 IMPLANT
PACK TOTAL KNEE CUSTOM (KITS) ×1 IMPLANT
PAD ARMBOARD 7.5X6 YLW CONV (MISCELLANEOUS) ×1 IMPLANT
PATELLA MEDIAL ATTUN 35MM KNEE (Knees) IMPLANT
PIN STEINMAN FIXATION KNEE (PIN) IMPLANT
PROTECTOR NERVE ULNAR (MISCELLANEOUS) ×1 IMPLANT
SET HNDPC FAN SPRY TIP SCT (DISPOSABLE) ×1 IMPLANT
SPIKE FLUID TRANSFER (MISCELLANEOUS) ×2 IMPLANT
SUT ETHIBOND NAB CT1 #1 30IN (SUTURE) ×1 IMPLANT
SUT VIC AB 0 CT1 36 (SUTURE) ×1 IMPLANT
SUT VIC AB 2-0 CT1 27 (SUTURE) ×1
SUT VIC AB 2-0 CT1 TAPERPNT 27 (SUTURE) ×1 IMPLANT
SUT VICRYL AB 3-0 FS1 BRD 27IN (SUTURE) ×1 IMPLANT
SUT VLOC 180 0 24IN GS25 (SUTURE) ×1 IMPLANT
TIBIAL BASE ROT PLAT SZ 5 KNEE (Knees) ×1 IMPLANT
TRAY FOLEY MTR SLVR 16FR STAT (SET/KITS/TRAYS/PACK) IMPLANT
WATER STERILE IRR 1000ML POUR (IV SOLUTION) ×2 IMPLANT
WRAP KNEE MAXI GEL POST OP (GAUZE/BANDAGES/DRESSINGS) ×1 IMPLANT
YANKAUER SUCT BULB TIP NO VENT (SUCTIONS) ×1 IMPLANT

## 2023-06-04 NOTE — Transfer of Care (Signed)
Immediate Anesthesia Transfer of Care Note  Patient: Olivia Zamora  Procedure(s) Performed: LEFT TOTAL KNEE ARTHROPLASTY (Left: Knee)  Patient Location: PACU  Anesthesia Type:Spinal  Level of Consciousness: awake, alert , drowsy, and patient cooperative  Airway & Oxygen Therapy: Patient Spontanous Breathing and Patient connected to face mask oxygen  Post-op Assessment: Report given to RN and Post -op Vital signs reviewed and stable  Post vital signs: Reviewed and stable  Last Vitals:  Vitals Value Taken Time  BP 100/48   Temp 98   Pulse 65   Resp 12   SpO2 98     Last Pain:  Vitals:   06/04/23 0655  TempSrc:   PainSc: 7       Patients Stated Pain Goal: 5 (06/04/23 0655)  Complications: No notable events documented.

## 2023-06-04 NOTE — Progress Notes (Signed)
Orthopedic Tech Progress Note Patient Details:  Olivia Zamora 01/03/1971 [id number]  Ortho Devices Type of Ortho Device: Bone foam zero knee Ortho Device/Splint Location: left Ortho Device/Splint Interventions: Ordered, Application, Adjustment   Post Interventions Patient Tolerated: Well Instructions Provided: Adjustment of device, Care of device  Kizzie Fantasia 06/04/2023, 3:05 PM

## 2023-06-04 NOTE — Interval H&P Note (Signed)
History and Physical Interval Note:  06/04/2023 8:50 AM  Olivia Zamora  has presented today for surgery, with the diagnosis of LEFT KNEE DEGENERATIVE JOINT DISEASE.  The various methods of treatment have been discussed with the patient and family. After consideration of risks, benefits and other options for treatment, the patient has consented to  Procedure(s): LEFT TOTAL KNEE ARTHROPLASTY (Left) as a surgical intervention.  The patient's history has been reviewed, patient examined, no change in status, stable for surgery.  I have reviewed the patient's chart and labs.  Questions were answered to the patient's satisfaction.     Velna Ochs

## 2023-06-04 NOTE — Op Note (Signed)
PREOP DIAGNOSIS: DJD LEFT KNEE POSTOP DIAGNOSIS:  same PROCEDURE: LEFT TKR ANESTHESIA: Spinal and MAC ATTENDING SURGEON: Velna Ochs ASSISTANT: Elodia Florence PA  INDICATIONS FOR PROCEDURE: Olivia Zamora is a 52 y.o. female who has struggled for a long time with pain due to degenerative arthritis of the left knee.  The patient has failed many conservative non-operative measures and at this point has pain which limits the ability to sleep and walk.  The patient is offered total knee replacement.  Informed operative consent was obtained after discussion of possible risks of anesthesia, infection, neurovascular injury, DVT, and death.  The importance of the post-operative rehabilitation protocol to optimize result was stressed extensively with the patient.  SUMMARY OF FINDINGS AND PROCEDURE:  Olivia Zamora was taken to the operative suite where under the above anesthesia a left knee replacement was performed.  There were advanced degenerative changes and the bone quality was good.  We used the DePuyAttune system and placed size 4 femur, 5 tibia, 35 mm all polyethylene patella, and a size 5 mm spacer.  Elodia Florence PA-C assisted throughout and was invaluable to the completion of the case in that he helped retract and maintain exposure while I placed the components.  He also helped close thereby minimizing OR time.  The patient was admitted for appropriate post-op care to include perioperative antibiotics and mechanical and pharmacologic measures for DVT prophylaxis.  DESCRIPTION OF PROCEDURE:  Olivia Zamora was taken to the operative suite where the above anesthesia was applied.  The patient was positioned supine and prepped and draped in normal sterile fashion.  An appropriate time out was performed.  After the administration of kefzol pre-op antibiotic the leg was elevated and exsanguinated and a tourniquet inflated.  A standard longitudinal incision was made on the anterior knee.  Dissection was carried  down to the extensor mechanism.  All appropriate anti-infective measures were used including the pre-operative antibiotic, betadine impregnated drape, and closed hooded exhaust systems for each member of the surgical team.  A medial parapatellar incision was made in the extensor mechanism and the knee cap flipped and the knee flexed.  Some residual meniscal tissues were removed along with any remaining ACL/PCL tissue.  A guide was placed on the tibia and a flat cut was made on it's superior surface.  An intramedullary guide was placed in the femur and was utilized to make anterior and posterior cuts creating an appropriate flexion gap.  A second intramedullary guide was placed in the femur to make a distal cut properly balancing the knee with an extension gap equal to the flexion gap.  The three bones sized to the above mentioned sizes and the appropriate guides were placed and utilized.  A trial reduction was done and the knee easily came to full extension and the patella tracked well on flexion.  The trial components were removed and all bones were cleaned with pulsatile lavage and then dried thoroughly.  Cement was mixed and was pressurized onto the bones followed by placement of the aforementioned components.  Excess cement was trimmed and pressure was held on the components until the cement had hardened.  The tourniquet was deflated and a small amount of bleeding was controlled with cautery and pressure.  The knee was irrigated thoroughly.  The extensor mechanism was re-approximated with #1 ethibond in interrupted fashion.  The knee was flexed and the repair was solid.  The subcutaneous tissues were re-approximated with #0 and #2-0 vicryl and the skin closed with a  subcuticular stitch and steristrips.  A sterile dressing was applied.  Intraoperative fluids, EBL, and tourniquet time can be obtained from anesthesia records.  DISPOSITION:  The patient was taken to recovery room in stable condition and scheduled  to potentially go home same day depending on ability to walk and tolerate liquids.  Olivia Zamora 06/04/2023, 11:08 AM

## 2023-06-04 NOTE — Anesthesia Procedure Notes (Signed)
Anesthesia Regional Block: Adductor canal block   Pre-Anesthetic Checklist: , timeout performed,  Correct Patient, Correct Site, Correct Laterality,  Correct Procedure, Correct Position, site marked,  Risks and benefits discussed,  Surgical consent,  Pre-op evaluation,  At surgeon's request and post-op pain management  Laterality: Left  Prep: Dura Prep       Needles:  Injection technique: Single-shot  Needle Type: Echogenic Stimulator Needle     Needle Length: 10cm  Needle Gauge: 20     Additional Needles:   Procedures:,,,, ultrasound used (permanent image in chart),,    Narrative:  Start time: 06/04/2023 8:35 AM End time: 06/04/2023 8:38 AM Injection made incrementally with aspirations every 5 mL.  Performed by: Personally  Anesthesiologist: Atilano Median, DO  Additional Notes: Patient identified. Risks/Benefits/Options discussed with patient including but not limited to bleeding, infection, nerve damage, failed block, incomplete pain control. Patient expressed understanding and wished to proceed. All questions were answered. Sterile technique was used throughout the entire procedure. Please see nursing notes for vital signs. Aspirated in 5cc intervals with injection for negative confirmation. Patient was given instructions on fall risk and not to get out of bed. All questions and concerns addressed with instructions to call with any issues or inadequate analgesia.

## 2023-06-04 NOTE — Anesthesia Procedure Notes (Signed)
Spinal  Patient location during procedure: OR Start time: 06/04/2023 9:43 AM End time: 06/04/2023 9:45 AM Staffing Performed: anesthesiologist  Anesthesiologist: Atilano Median, DO Performed by: Atilano Median, DO Authorized by: Atilano Median, DO   Preanesthetic Checklist Completed: patient identified, IV checked, site marked, risks and benefits discussed, surgical consent, monitors and equipment checked, pre-op evaluation and timeout performed Spinal Block Patient position: sitting Prep: DuraPrep Patient monitoring: heart rate, cardiac monitor, continuous pulse ox and blood pressure Approach: midline Location: L3-4 Injection technique: single-shot Needle Needle type: Pencan  Needle gauge: 24 G Needle length: 10 cm Assessment Events: CSF return Additional Notes Patient identified. Risks/Benefits/Options discussed with patient including but not limited to bleeding, infection, nerve damage, paralysis, failed block, incomplete pain control, headache, blood pressure changes, nausea, vomiting, reactions to medications, itching and postpartum back pain. Confirmed with bedside nurse the patient's most recent platelet count. Confirmed with patient that they are not currently taking any anticoagulation, have any bleeding history or any family history of bleeding disorders. Patient expressed understanding and wished to proceed. All questions were answered. Sterile technique was used throughout the entire procedure. Please see nursing notes for vital signs. Warning signs of high block given to the patient including shortness of breath, tingling/numbness in hands, complete motor block, or any concerning symptoms with instructions to call for help. Patient was given instructions on fall risk and not to get out of bed. All questions and concerns addressed with instructions to call with any issues or inadequate analgesia.

## 2023-06-04 NOTE — Evaluation (Signed)
Physical Therapy Evaluation Patient Details Name: Olivia Zamora MRN: [id number] DOB: January 07, 1971 Today's Date: 06/04/2023  History of Present Illness  52 yo female S/P L TKA 06/04/23.PMH:Htn, anxiety, depression, DVT, HTN  Clinical Impression  The patient ambulated and practiced steps, met goals for Dc home. Patient's spouse present , and instructed on step safety and HEP.     Recommendations for follow up therapy are one component of a multi-disciplinary discharge planning process, led by the attending physician.  Recommendations may be updated based on patient status, additional functional criteria and insurance authorization.  Follow Up Recommendations       Assistance Recommended at Discharge Intermittent Supervision/Assistance  Patient can return home with the following  A little help with walking and/or transfers;A little help with bathing/dressing/bathroom;Help with stairs or ramp for entrance    Equipment Recommendations None recommended by PT  Recommendations for Other Services       Functional Status Assessment Patient has had a recent decline in their functional status and demonstrates the ability to make significant improvements in function in a reasonable and predictable amount of time.     Precautions / Restrictions Precautions Precautions: Knee;Fall Restrictions Weight Bearing Restrictions: No      Mobility  Bed Mobility Overal bed mobility: Needs Assistance Bed Mobility: Supine to Sit     Supine to sit: Min guard     General bed mobility comments: supported  LLE with belt    Transfers Overall transfer level: Needs assistance Equipment used: Rolling walker (2 wheels) Transfers: Sit to/from Stand Sit to Stand: Min assist           General transfer comment: cues for hand a nd LLE pla,cement    Ambulation/Gait Ambulation/Gait assistance: Min assist Gait Distance (Feet): 60 Feet (then  60 x 2) Assistive device: Rolling walker (2 wheels) Gait  Pattern/deviations: Step-to pattern Gait velocity: decr     General Gait Details: patiwent issued a SW so had patient practice  as SW, giat slow,  LLE stable  Stairs Stairs: Yes Stairs assistance: Min assist Stair Management: One rail Left Number of Stairs: 2 General stair comments: cues for  sequence, spouse present  Wheelchair Mobility    Modified Rankin (Stroke Patients Only)       Balance Overall balance assessment: Mild deficits observed, not formally tested, History of Falls                                           Pertinent Vitals/Pain Pain Assessment Pain Assessment: 0-10 Pain Score: 2  Pain Location: left knee, Pain Descriptors / Indicators: Discomfort Pain Intervention(s): Premedicated before session, Monitored during session    Home Living Family/patient expects to be discharged to:: Private residence Living Arrangements: Spouse/significant other;Children Available Help at Discharge: Family;Available 24 hours/day Type of Home: House Home Access: Stairs to enter Entrance Stairs-Rails: Left Entrance Stairs-Number of Steps: 2   Home Layout: One level Home Equipment: Standard Walker;Cane - single point      Prior Function Prior Level of Function : Independent/Modified Independent             Mobility Comments: history of multiplr falls ADLs Comments: mod I     Hand Dominance   Dominant Hand: Right    Extremity/Trunk Assessment   Upper Extremity Assessment Upper Extremity Assessment: Overall WFL for tasks assessed    Lower Extremity Assessment Lower Extremity Assessment: LLE deficits/detail  LLE Deficits / Details: SLR with lag, knee flexion 5-60    Cervical / Trunk Assessment Cervical / Trunk Assessment: Normal  Communication   Communication: No difficulties  Cognition Arousal/Alertness: Awake/alert Behavior During Therapy: WFL for tasks assessed/performed Overall Cognitive Status: Within Functional Limits for  tasks assessed                                          General Comments      Exercises Total Joint Exercises Ankle Circles/Pumps: AROM, Both, 10 reps, Supine Quad Sets: AROM, Both, 10 reps, Supine Heel Slides: AAROM, Left, 10 reps, Supine Straight Leg Raises: AAROM, Left, 10 reps Long Arc Quad: AROM, 10 reps Knee Flexion: AROM, 10 reps   Assessment/Plan    PT Assessment All further PT needs can be met in the next venue of care  PT Problem List Decreased strength;Decreased mobility;Decreased safety awareness;Decreased range of motion;Decreased activity tolerance;Decreased balance;Decreased knowledge of use of DME;Pain       PT Treatment Interventions      PT Goals (Current goals can be found in the Care Plan section)  Acute Rehab PT Goals PT Goal Formulation: All assessment and education complete, DC therapy    Frequency       Co-evaluation               AM-PAC PT "6 Clicks" Mobility  Outcome Measure Help needed turning from your back to your side while in a flat bed without using bedrails?: A Little Help needed moving from lying on your back to sitting on the side of a flat bed without using bedrails?: A Little Help needed moving to and from a bed to a chair (including a wheelchair)?: A Little Help needed standing up from a chair using your arms (e.g., wheelchair or bedside chair)?: A Little Help needed to walk in hospital room?: A Little Help needed climbing 3-5 steps with a railing? : A Little 6 Click Score: 18    End of Session Equipment Utilized During Treatment: Gait belt Activity Tolerance: Patient tolerated treatment well Patient left: in chair;with call bell/phone within reach;with family/visitor present Nurse Communication: Mobility status PT Visit Diagnosis: Unsteadiness on feet (R26.81);Pain Pain - Right/Left: Left Pain - part of body: Knee    Time: 1610-9604 PT Time Calculation (min) (ACUTE ONLY): 47 min   Charges:   PT  Evaluation $PT Eval Low Complexity: 1 Low PT Treatments $Gait Training: 8-22 mins $Therapeutic Exercise: 8-22 mins        Blanchard Kelch PT Acute Rehabilitation Services Office [phone] Weekend [phone]   Rada Hay 06/04/2023, 3:55 PM

## 2023-06-04 NOTE — Anesthesia Postprocedure Evaluation (Signed)
Anesthesia Post Note  Patient: Olivia Zamora  Procedure(s) Performed: LEFT TOTAL KNEE ARTHROPLASTY (Left: Knee)     Patient location during evaluation: PACU Anesthesia Type: Regional, MAC and Spinal Level of consciousness: awake and alert Pain management: pain level controlled Vital Signs Assessment: post-procedure vital signs reviewed and stable Respiratory status: spontaneous breathing, nonlabored ventilation, respiratory function stable and patient connected to nasal cannula oxygen Cardiovascular status: stable and blood pressure returned to baseline Postop Assessment: no apparent nausea or vomiting Anesthetic complications: no   No notable events documented.  Last Vitals:  Vitals:   06/04/23 1245 06/04/23 1311  BP: 121/86 115/80  Pulse: 82 76  Resp: 12 20  Temp: 36.5 C (!) 36.4 C  SpO2: 100% 100%    Last Pain:  Vitals:   06/04/23 1311  TempSrc: Oral  PainSc: 0-No pain                 Earl Lites P Lonell Stamos

## 2023-06-05 ENCOUNTER — Encounter (HOSPITAL_COMMUNITY): Payer: Self-pay | Admitting: Orthopaedic Surgery

## 2023-06-09 ENCOUNTER — Other Ambulatory Visit: Payer: Self-pay | Admitting: Family Medicine

## 2023-06-09 ENCOUNTER — Other Ambulatory Visit: Payer: Self-pay | Admitting: Diagnostic Neuroimaging

## 2023-06-09 DIAGNOSIS — F5101 Primary insomnia: Secondary | ICD-10-CM

## 2023-06-09 DIAGNOSIS — Z79899 Other long term (current) drug therapy: Secondary | ICD-10-CM

## 2023-06-09 DIAGNOSIS — Z0000 Encounter for general adult medical examination without abnormal findings: Secondary | ICD-10-CM

## 2023-06-10 ENCOUNTER — Other Ambulatory Visit: Payer: Self-pay | Admitting: Family Medicine

## 2023-06-10 DIAGNOSIS — Z0000 Encounter for general adult medical examination without abnormal findings: Secondary | ICD-10-CM

## 2023-06-10 DIAGNOSIS — Z79899 Other long term (current) drug therapy: Secondary | ICD-10-CM

## 2023-06-10 DIAGNOSIS — F5101 Primary insomnia: Secondary | ICD-10-CM

## 2023-06-10 NOTE — Telephone Encounter (Signed)
  Prescription Request  06/10/2023  Is this a "Controlled Substance" medicine? yes  Have you seen your PCP in the last 2 weeks? no  If YES, route message to pool  -  If NO, patient needs to be scheduled for appointment.  What is the name of the medication or equipment? Zolpidem 5 mg Just had a knee replacement and has apt with [facility] 7-23 and  needs a refill to get her through  Have you contacted your pharmacy to request a refill? no   Which pharmacy would you like this sent to? [facility] in [place]   Patient notified that their request is being sent to the clinical staff for review and that they should receive a response within 2 business days.

## 2023-06-11 ENCOUNTER — Ambulatory Visit: Payer: BLUE CROSS/BLUE SHIELD | Admitting: Family Medicine

## 2023-06-11 NOTE — Telephone Encounter (Signed)
Informed pt that her request was for a controlled medication, I did send to her PCP but it will still have to be filled during a visit which is scheduled for 07/02/23, put her appt on the wait list for any cancellations.

## 2023-06-20 ENCOUNTER — Ambulatory Visit: Payer: BLUE CROSS/BLUE SHIELD | Attending: Orthopaedic Surgery | Admitting: Physical Therapy

## 2023-06-20 ENCOUNTER — Encounter: Payer: Self-pay | Admitting: Physical Therapy

## 2023-06-20 ENCOUNTER — Other Ambulatory Visit: Payer: Self-pay

## 2023-06-20 DIAGNOSIS — M25562 Pain in left knee: Secondary | ICD-10-CM | POA: Diagnosis present

## 2023-06-20 DIAGNOSIS — M6281 Muscle weakness (generalized): Secondary | ICD-10-CM | POA: Insufficient documentation

## 2023-06-20 DIAGNOSIS — R600 Localized edema: Secondary | ICD-10-CM | POA: Insufficient documentation

## 2023-06-20 DIAGNOSIS — G8929 Other chronic pain: Secondary | ICD-10-CM | POA: Diagnosis present

## 2023-06-20 DIAGNOSIS — M25662 Stiffness of left knee, not elsewhere classified: Secondary | ICD-10-CM | POA: Insufficient documentation

## 2023-06-20 NOTE — Therapy (Signed)
OUTPATIENT PHYSICAL THERAPY LOWER EXTREMITY EVALUATION   Patient Name: Olivia Zamora MRN: [id number] DOB:February 05, 1971, 52 y.o., female Today's Date: 06/20/2023  END OF SESSION:  PT End of Session - 06/20/23 1403     Visit Number 1    Number of Visits 12    Date for PT Re-Evaluation 08/01/23    Authorization Type FOTO.    PT Start Time 0102    PT Stop Time 0155    PT Time Calculation (min) 53 min    Activity Tolerance Patient tolerated treatment well    Behavior During Therapy WFL for tasks assessed/performed             Past Medical History:  Diagnosis Date   Anxiety    Failed therapy with Paxil, Lexparo, Prozac, Wellbutrin, Xanax, and Ativan   Arthritis    Asthma    Cardiac disease    Angina   Chronic back pain    Constipation 08/26/2019   Depression    Failed therapy with Paxil, Lexparo, Prozac, Wellbutrin, and Ativan   Disease of thyroid gland 12/28/2011   multiple thyroid nodules in 2013 - [place] on 03/08/14 showed heterogeneous appearance of thyroid gland without focal nodule   Eczema    Generalized seizure (HCC)    stress related   GERD (gastroesophageal reflux disease)    History of DVT (deep vein thrombosis) 1988   Hypertension    Hyperthyroidism    Hypothyroidism    IBS (irritable bowel syndrome)    Internal derangement of left knee    Lactose intolerance    Migraine    Sleep apnea    wears CPAP   Vitamin D deficiency    Past Surgical History:  Procedure Laterality Date   CESAREAN SECTION     x3   COLONOSCOPY     HEMANGIOMA EXCISION Right    shoulder   IR RADIOLOGIST EVAL & MGMT  05/04/2021   MASS EXCISION Right    lump from palm of hand   OOPHORECTOMY     OTHER SURGICAL HISTORY Right    Repair of tib-fib fracture   TOTAL ABDOMINAL HYSTERECTOMY     TOTAL KNEE ARTHROPLASTY Left 06/04/2023   Procedure: LEFT TOTAL KNEE ARTHROPLASTY;  Surgeon: Marcene Corning, MD;  Location: [facility];  Service: Orthopedics;  Laterality: Left;   TUBAL LIGATION      TUMOR EXCISION Right    Axilla - benign   VASCULAR SURGERY Right 1989   blood clot removed from neck   Patient Active Problem List   Diagnosis Date Noted   Mixed hyperlipidemia 05/09/2023   Essential hypertension 11/21/2022   Primary insomnia 11/21/2022   Controlled substance agreement signed 11/21/2022   Acquired hypothyroidism 11/21/2022   H/O: CVA (cerebrovascular accident) 08/01/2021   History of DVT (deep vein thrombosis) 08/01/2021   Symptomatic mammary hypertrophy 06/13/2021   IBS (irritable bowel syndrome) 08/26/2019   Vitamin D deficiency    Sleep apnea    Depression    Anxiety    Asthma    Disease of thyroid gland 12/28/2011   REFERRING PROVIDER: Marcene Corning MD  REFERRING DIAG: S/p left total knee replacement.  THERAPY DIAG:  Chronic pain of left knee  Stiffness of left knee, not elsewhere classified  Localized edema  Muscle weakness (generalized)  Rationale for Evaluation and Treatment: Rehabilitation  ONSET DATE: Ongoing. Surgery (06/04/23).  SUBJECTIVE:   SUBJECTIVE STATEMENT: The patient presents to the clinic s/p left total knee replacement performed on 06/04/23.  Her pain-level today is a  7-8/10.  She is walking safely with a standard walker currently.  She has a "stretch out" strap that she is using to lift her left LE for transfers (ie:  sit to supine) and attempting to do heel slides to improve her flexion.  She has a "zero knee" as well at home to improve her extension.  PERTINENT HISTORY: Please see above. PAIN:  Are you having pain? Yes: NPRS scale: 7-8/10 Pain location: Left knee. Pain description: Ache, throb, sore. Aggravating factors: "Everything." Relieving factors: 'Not much."  PRECAUTIONS: Other: No ultrasound.   WEIGHT BEARING RESTRICTIONS: No  FALLS:  Has patient fallen in last 6 months? No  LIVING ENVIRONMENT: Lives with: lives with their spouse Lives in: House/apartment Stairs: Yes.  Non-reciprocating pattern. Has  following equipment at home:  Standard walker.  PLOF: Independent with basic ADLs  PATIENT GOALS: Get her life back.  Return to gym workouts which she loved.   OBJECTIVE:   PATIENT SURVEYS:  FOTO .  EDEMA:  Circumferential: Right 5 cms > left.  PALPATION: C/o diffuse left knee pain currently.  Incision appears to be healing well.  LOWER EXTREMITY ROM:  In supine: Gentle passive extension to -15 degrees.  Seated flexion to 70 degrees and to 75 degrees after 5 minutes on the Nustep.  LOWER EXTREMITY MMT:  Patient unable to perform an active antigravity left SLR currently.  She has a decrease in volitional activation of her left quadriceps musculature.    GAIT: The patient is walking safely with a standard walker with good advancement of her left LE.  TODAY'S TREATMENT:                                                                                                                              DATE: Nustep level 1 x 8 minutes moving seat forward x 1 to increase flexion f/b  LE elevation and vasopneumatic on low to patient's left knee.  Normal modality response following removal of modality.  PATIENT EDUCATION:  Education details: See below. Person educated: Patient and Spouse Education method: Explanation and Handouts Education comprehension: verbalized understanding  HOME EXERCISE PROGRAM: HOME EXERCISE PROGRAM Created by [place] Johsua Shevlin Jul 11th, 2024 View at [email or web address] using code: [email or web address]  Page 1 of 1 1 Exercise PRONE KNEE HANGS While lying down on your stomach, allow your leg to hang off the end of a table/bed. Position yourself so that your knee cap is just over the end of the table/bed.  Just relax your body and allow gravity to stretch your knee into a more straightened position. Add an ankle weight for increased stretch. Repeat 1 Time Hold 10 Minutes Complete 1 Set Perform 3 Times a Day  ASSESSMENT:  CLINICAL IMPRESSION: The patient presents  to [facility] s/p left total knee replacement performed on 06/04/23.  She is walking safely with a standard walker.  She is currently lacking extension and flexion.  She has "zero knee" and was instructed in  a prone hang to work on improving this.  She has a "stretch out" strap and is using to perform heel slides to improve flexion.  She is not unable to perform an antigravity SLR at this time and has a decrease in volitional activation of her left quadriceps musculature. She demonstrates moderate edema currently.  She is using her strap to lift her left LE for transfers (ie:  sit to supine).  Patient will benefit from skilled physical therapy intervention to address pain and deficits.  OBJECTIVE IMPAIRMENTS: Abnormal gait, decreased activity tolerance, decreased mobility, decreased ROM, decreased strength, increased edema, and pain.   ACTIVITY LIMITATIONS: lifting, transfers, bed mobility, and locomotion level  PARTICIPATION LIMITATIONS: meal prep, cleaning, and laundry  PERSONAL FACTORS: Time since onset of injury/illness/exacerbation are also affecting patient's functional outcome.   REHAB POTENTIAL: Excellent  CLINICAL DECISION MAKING: Stable/uncomplicated  EVALUATION COMPLEXITY: Low   GOALS:  SHORT TERM GOALS: Target date: 07/04/23  Ind with an initial HEP. Goal status: INITIAL  2.  Full active left knee extension in order to normalize gait.  Goal status: INITIAL  3.  Active left knee flexion to 90 degrees.  Goal status: INITIAL  LONG TERM GOALS: Target date: 08/01/23.  Ind with an advanced HEP.  Goal status: INITIAL  2.  Active left knee flexion to 115 degrees+ so the patient can perform functional tasks and do so with pain not > 2-3/10.  Goal status: INITIAL  3.  Increase left hip and knee strength to a solid 4+/5 to provide good stability for accomplishment of functional activities.  Goal status: INITIAL  4.  Perform a reciprocating stair gait with one railing with pain not >  2-3/10.  Goal status: INITIAL  PLAN:  PT FREQUENCY:  2-3 times a week  PT DURATION: 6 weeks  PLANNED INTERVENTIONS: Therapeutic exercises, Therapeutic activity, Neuromuscular re-education, Gait training, Patient/Family education, Self Care, Electrical stimulation, Cryotherapy, Moist heat, Vasopneumatic device, and Manual therapy  PLAN FOR NEXT SESSION: Nustep, PROM, review HEP.  VMS to left quadriceps.  TKA protocol progression.   LE elevation and vasopneumatic    Josphine Laffey, [place], PT 06/20/2023, 2:35 PM

## 2023-06-21 ENCOUNTER — Encounter: Payer: BLUE CROSS/BLUE SHIELD | Admitting: Physical Therapy

## 2023-06-23 ENCOUNTER — Emergency Department (HOSPITAL_COMMUNITY): Payer: BLUE CROSS/BLUE SHIELD

## 2023-06-23 ENCOUNTER — Encounter (HOSPITAL_COMMUNITY): Payer: Self-pay

## 2023-06-23 ENCOUNTER — Other Ambulatory Visit: Payer: Self-pay

## 2023-06-23 ENCOUNTER — Emergency Department (HOSPITAL_COMMUNITY)
Admission: EM | Admit: 2023-06-23 | Discharge: 2023-06-23 | Disposition: A | Discharge status: Home / Self Care | Payer: BLUE CROSS/BLUE SHIELD | Attending: Emergency Medicine | Admitting: Emergency Medicine

## 2023-06-23 DIAGNOSIS — R0602 Shortness of breath: Secondary | ICD-10-CM | POA: Diagnosis not present

## 2023-06-23 DIAGNOSIS — R079 Chest pain, unspecified: Secondary | ICD-10-CM | POA: Diagnosis present

## 2023-06-23 LAB — CBC
HCT: 39 % (ref 36.0–46.0)
Hemoglobin: 12.8 g/dL (ref 12.0–15.0)
MCH: 30.9 pg (ref 26.0–34.0)
MCHC: 32.8 g/dL (ref 30.0–36.0)
MCV: 94.2 fL (ref 80.0–100.0)
Platelets: 199 10*3/uL (ref 150–400)
RBC: 4.14 MIL/uL (ref 3.87–5.11)
RDW: 13.5 % (ref 11.5–15.5)
WBC: 4.5 10*3/uL (ref 4.0–10.5)
nRBC: 0 % (ref 0.0–0.2)

## 2023-06-23 LAB — BASIC METABOLIC PANEL
Anion gap: 13 (ref 5–15)
BUN: 13 mg/dL (ref 6–20)
CO2: 22 mmol/L (ref 22–32)
Calcium: 9.4 mg/dL (ref 8.9–10.3)
Chloride: 109 mmol/L (ref 98–111)
Creatinine, Ser: 0.66 mg/dL (ref 0.44–1.00)
GFR, Estimated: >60 mL/min (ref >60)
Glucose, Bld: 107 mg/dL — ABNORMAL HIGH (ref 70–99)
Potassium: 3.2 mmol/L — ABNORMAL LOW (ref 3.5–5.1)
Sodium: 144 mmol/L (ref 135–145)

## 2023-06-23 LAB — HEPATIC FUNCTION PANEL
ALT: 18 U/L (ref 0–44)
AST: 22 U/L (ref 15–41)
Albumin: 4.4 g/dL (ref 3.5–5.0)
Alkaline Phosphatase: 72 U/L (ref 38–126)
Bilirubin, Direct: 0.2 mg/dL (ref 0.0–0.2)
Indirect Bilirubin: 0.8 mg/dL (ref 0.3–0.9)
Total Bilirubin: 1 mg/dL (ref 0.3–1.2)
Total Protein: 7.1 g/dL (ref 6.5–8.1)

## 2023-06-23 LAB — LIPASE, BLOOD: Lipase: 27 U/L (ref 11–51)

## 2023-06-23 LAB — TROPONIN I (HIGH SENSITIVITY)
Troponin I (High Sensitivity): <2 ng/L (ref <18)
Troponin I (High Sensitivity): <2 ng/L (ref <18)

## 2023-06-23 MED ORDER — POTASSIUM CHLORIDE 20 MEQ PO PACK
40.0000 meq | PACK | Freq: Once | ORAL | Status: AC
  Administered 2023-06-23: 40 meq via ORAL
  Filled 2023-06-23: qty 2

## 2023-06-23 MED ORDER — ONDANSETRON HCL 4 MG/2ML IJ SOLN
4.0000 mg | Freq: Once | INTRAMUSCULAR | Status: AC
  Administered 2023-06-23: 4 mg via INTRAVENOUS
  Filled 2023-06-23: qty 2

## 2023-06-23 MED ORDER — TECHNETIUM TO 99M ALBUMIN AGGREGATED
4.3500 | Freq: Once | INTRAVENOUS | Status: AC
  Administered 2023-06-23: 4.35 via INTRAVENOUS

## 2023-06-23 NOTE — ED Provider Notes (Signed)
[place] EMERGENCY DEPARTMENT AT [facility] Provider Note   CSN: [id number] Arrival date & time: 06/23/23  0446     History Chief Complaint  Patient presents with   Chest Pain    Olivia Zamora is a 52 y.o. female who presents with husband bedside with concern for chest pain and shortness of breath that started around 1:00, pain is rating to the left neck and shoulder.  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  Also endorses nausea.  History of DVT.  Patient currently not anticoagulated.  Reviewed her medical records.  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  HPI     Home Medications Prior to Admission medications   Medication Sig Start Date End Date Taking? Authorizing Provider  albuterol (ACCUNEB) 0.63 MG/3ML nebulizer solution Take 3 mLs (0.63 mg total) by nebulization every 6 (six) hours as needed for wheezing. Patient taking differently: Take 1 ampule by nebulization every 6 (six) hours as needed for wheezing (allergy). 06/20/22   Gwenlyn Fudge, FNP  aspirin EC 81 MG tablet Take 1 tablet (81 mg total) by mouth 2 (two) times daily. For 2 weeks then back to once a day for DVT prevention. 06/04/23   Elodia Florence, PA-C  buPROPion (WELLBUTRIN XL) 300 MG 24 hr tablet Take 1 tablet (300 mg total) by mouth daily. 05/10/23   Sonny Masters, FNP  docusate sodium (COLACE) 100 MG capsule Take 1 capsule (100 mg total) by mouth daily. Patient taking differently: Take 100 mg by mouth 2 (two) times daily. 06/20/22   Gwenlyn Fudge, FNP  fluticasone (FLONASE) 50 MCG/ACT nasal spray Place 1 spray into both nostrils 2 (two) times daily.    [provider]  Fremanezumab-vfrm (AJOVY) 225 MG/1.5ML SOAJ INJECT 1.5ML INTO THE SKIN EVERY 28 DAYS 05/08/23   Penumalli, Glenford Bayley, MD   HYDROcodone-acetaminophen (NORCO/VICODIN) 5-325 MG tablet Take 1-2 tablets by mouth every 6 (six) hours as needed for moderate pain or severe pain (post op pain). Patient not taking: Reported on 06/20/2023 06/04/23 06/03/24  Elodia Florence, PA-C  linaclotide [facility]) 72 MCG capsule Take 1 capsule (72 mcg total) by mouth daily before breakfast. 05/10/23   Rakes, Doralee Albino, FNP  losartan (COZAAR) 50 MG tablet Take 1 tablet (50 mg total) by mouth daily. 05/10/23   Sonny Masters, FNP  Multiple Vitamins-Minerals (MULTIVITAMIN ADULT PO) Take 1 tablet by mouth daily.    [provider]  NURTEC 75 MG TBDP DISSOLVE 1 TABLET BY MOUTH DAILY AS NEEDED 06/10/23   Penumalli, Glenford Bayley, MD  SYNTHROID 100 MCG tablet Take 100 mcg by mouth every other day. 12/11/20   [provider]  Tiotropium Bromide Monohydrate (SPIRIVA RESPIMAT) 1.25 MCG/ACT AERS Inhale 2 puffs into the lungs daily. Patient taking differently: Inhale 2 puffs into the lungs 2 (two) times daily. 11/26/22   Sonny Masters, FNP  tiZANidine (ZANAFLEX) 4 MG tablet Take 1 tablet (4 mg total) by mouth every 6 (six) hours as needed for muscle spasms. 06/04/23 06/03/24  Elodia Florence, PA-C  traMADol (ULTRAM) 50 MG tablet Take by mouth every 6 (six) hours as needed.    [provider]  Vitamin D, Ergocalciferol, (DRISDOL) 1.25 MG (50000 UT) CAPS capsule Take 50,000 Units by mouth every 7 (seven) days.    [provider]  zolpidem (AMBIEN) 5 MG tablet Take 1 tablet (5 mg total) by mouth at bedtime as needed for sleep. 11/21/22   Rakes, Doralee Albino, FNP      Allergies    Iodine, Iohexol, Latex, and Topamax [topiramate]    Review of Systems   Review of Systems  Constitutional: Negative.   HENT: Negative.    Respiratory:  Positive for shortness of breath.   Cardiovascular:  Positive for chest pain. Negative for palpitations and leg swelling.  Gastrointestinal: Negative.   Genitourinary: Negative.     Physical Exam Updated Vital  Signs BP 137/83   Pulse 67   Temp 98.9 F (37.2 C) (Oral)   Resp 20   SpO2 96%  Physical Exam Vitals and nursing note reviewed.  Constitutional:      Appearance: She is not ill-appearing or toxic-appearing.  HENT:     Head: Normocephalic and atraumatic.     Mouth/Throat:     Mouth: Mucous membranes are moist.     Pharynx: No oropharyngeal exudate or posterior oropharyngeal erythema.  Eyes:     General:        Right eye: No discharge.        Left eye: No discharge.     Conjunctiva/sclera: Conjunctivae normal.  Cardiovascular:     Rate and Rhythm: Normal rate and regular rhythm.     Pulses: Normal pulses.     Heart sounds: Normal heart sounds. No murmur heard. Pulmonary:     Effort: Pulmonary effort is normal. No respiratory distress.     Breath sounds: Normal breath sounds. No wheezing or rales.  Chest:     Chest wall: No mass, tenderness or edema.  Abdominal:     General: Bowel sounds are normal. There is no distension.     Palpations: Abdomen is soft.     Tenderness: There is no abdominal tenderness.  Musculoskeletal:        General: No deformity.     Cervical back: Neck supple.     Right lower leg: No edema.     Left lower leg: No edema.       Legs:  Skin:    General: Skin is warm and dry.  Neurological:     Mental Status: She is alert. Mental status is at baseline.  Psychiatric:        Mood and Affect: Mood normal.     ED Results / Procedures / Treatments   Labs (all labs ordered are listed, but only abnormal results are displayed) Labs Reviewed  BASIC METABOLIC PANEL - Abnormal; Notable for the following components:      Result Value   Potassium 3.2 (*)    Glucose, Bld 107 (*)    All other components within normal limits  CBC  LIPASE, BLOOD  HEPATIC FUNCTION PANEL  TROPONIN I (HIGH SENSITIVITY)  TROPONIN I (HIGH SENSITIVITY)    EKG EKG Interpretation Date/Time:  Sunday June 23 2023 05:02:35 EDT Ventricular Rate:  92 PR Interval:  158 QRS  Duration:  93 QT Interval:  359 QTC Calculation: 445 R Axis:   94  Text Interpretation: Sinus rhythm Borderline right axis deviation Borderline T abnormalities, anterior leads Baseline wander in lead(s) II III aVF V3 Confirmed by Drema Pry [phone]) on 06/23/2023 6:25:58 AM  Radiology DG Chest Port 1 View  Result Date: 06/23/2023 CLINICAL DATA:  52 year old female with history of chest pain. EXAM: PORTABLE CHEST 1 VIEW COMPARISON:  Chest x-ray 05/23/2023. FINDINGS: Lung volumes are normal.  No consolidative airspace disease. No pleural effusions. No pneumothorax. No pulmonary nodule or mass noted. Pulmonary vasculature and the cardiomediastinal silhouette are within normal limits. IMPRESSION: No radiographic evidence of acute cardiopulmonary disease. Electronically Signed   By: Trudie Reed M.D.   On: 06/23/2023 06:47    Procedures Procedures    Medications Ordered in ED Medications  potassium chloride (KLOR-CON) packet 40 mEq (has no administration in time range)    ED Course/ Medical Decision Making/ A&P                             Medical Decision Making 52 y/o female who presents with CP and SOB.   Vital signs normal intake.  Cardiopulmonary exam is normal, abdominal exam is benign.  Patient with left knee surgical incision which is CDI.  DDx includes limited to ACS, PE, pleural effusion, dysrhythmia, pneumothorax, pneumonia.   Amount and/or Complexity of Data Reviewed Labs: ordered.    Details: CBC unremarkable, BMP with hypokalemia of 3.2, remainder of labs pending..  Radiology: ordered.    Details:  Chest x-ray negative for acute cardiopulmonary disease.   Care of this patient signed out to oncoming ED provider Barnie Alderman, PA-C at time of shift change. All pertinent HPI, physical exam, and laboratory findings were discussed with them prior to my departure. Disposition of patient pending completion of workup, reevaluation, and clinical judgement of oncoming ED  provider.   This chart was dictated using voice recognition software, Dragon. Despite the best efforts of this provider to proofread and correct errors, errors may still occur which can change documentation meaning.   Final Clinical Impression(s) / ED Diagnoses Final diagnoses:  None    Rx / DC Orders ED Discharge Orders     None         Sherrilee Gilles 06/23/23 0715    Nira Conn, MD 06/23/23 [phone]

## 2023-06-23 NOTE — ED Provider Notes (Signed)
Received handoff from [facility] PA. Chest pain radiating to L arm/neck and SOB. 3 weeks post-op knee replacement.   Physical Exam  BP (!) 154/88 (BP Location: Left Arm)   Pulse 76   Temp 97.8 F (36.6 C) (Oral)   Resp 11   SpO2 100%   Physical Exam Vitals and nursing note reviewed.  Constitutional:      General: She is not in acute distress.    Appearance: She is well-developed.  HENT:     Head: Normocephalic and atraumatic.  Eyes:     Conjunctiva/sclera: Conjunctivae normal.  Cardiovascular:     Rate and Rhythm: Normal rate and regular rhythm.     Heart sounds: No murmur heard. Pulmonary:     Effort: Pulmonary effort is normal. No respiratory distress.     Breath sounds: Normal breath sounds.  Abdominal:     Palpations: Abdomen is soft.     Tenderness: There is no abdominal tenderness.  Musculoskeletal:        General: No swelling.     Cervical back: Neck supple.  Skin:    General: Skin is warm and dry.     Capillary Refill: Capillary refill takes less than 2 seconds.  Neurological:     Mental Status: She is alert.  Psychiatric:        Mood and Affect: Mood normal.     Procedures  Procedures  ED Course / MDM    Medical Decision Making I had a thorough discussion with the patient, she does not PERC out given that she is 50+ and recently is surgery, she is short of breath and has chest pain, but the shortness of breath is now resolved.  Chest pain is now minimal.  Her troponin is within normal limits.  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  We discussed pros cons regarding this, and that I would not be able to rule out a blood clot without getting the VQ scan, she is agreeable to VQ scan.  VQ scan within normal limits, troponins x 2 within normal limits, no evidence of PE, or MI.  She was ambulated and she was 98 to 100% on room air.  Discharged home and informed that she will need to follow-up with  her primary care doctor for further evaluation.  HEART: 3  Amount and/or Complexity of Data Reviewed Labs: ordered. Radiology: ordered.  Risk Prescription drug management.         Pete Pelt, [place] 06/23/23 1137    Glendora Score, MD 06/24/23 1106

## 2023-06-23 NOTE — ED Notes (Signed)
Spoke with Mary Sella. From Nuclear medicine and he said he would be here in about an hour to an hour and half.

## 2023-06-23 NOTE — Discharge Instructions (Signed)
Today your VQ scan she did not show any of any evidence of PE, also your troponin or heart enzymes were reassuring.  Please follow-up with your primary care doctor, for further evaluation.  It is unclear what the cause of your chest pain may have been.  Return to ER if you feel like your symptoms are worsening.

## 2023-06-23 NOTE — ED Notes (Signed)
Ambulated patient with a walker and she did well. Her saturations stayed between 98 and 100 and her heart rate was at a 100-104.

## 2023-06-23 NOTE — ED Triage Notes (Signed)
Pt. Arrives pov with husband c/o chest pain and SOB. Pt. Had a recent knee surgery she states that her surgical site has seemed to be more swollen and painful. Pt. Also c/o nausea

## 2023-06-24 ENCOUNTER — Encounter: Payer: Self-pay | Admitting: Diagnostic Neuroimaging

## 2023-06-24 ENCOUNTER — Ambulatory Visit: Payer: BLUE CROSS/BLUE SHIELD | Admitting: Diagnostic Neuroimaging

## 2023-06-24 ENCOUNTER — Telehealth: Payer: Self-pay

## 2023-06-24 VITALS — BP 120/79 | HR 90 | Ht 67.0 in | Wt 182.0 lb

## 2023-06-24 DIAGNOSIS — G43109 Migraine with aura, not intractable, without status migrainosus: Secondary | ICD-10-CM | POA: Diagnosis not present

## 2023-06-24 MED ORDER — NURTEC 75 MG PO TBDP: 75.0000 mg | ORAL_TABLET | ORAL | 12 refills | Status: DC | PRN

## 2023-06-24 MED ORDER — AJOVY 225 MG/1.5ML ~~LOC~~ SOAJ: 225.0000 mg | SUBCUTANEOUS | 4 refills | Status: DC

## 2023-06-24 NOTE — Progress Notes (Signed)
[facility]  PATIENT: Olivia Zamora DOB: [birth year]  REFERRING CLINICIAN: Sonny Masters, FNP HISTORY FROM: patient and husband  REASON FOR VISIT: follow up   HISTORICAL  CHIEF COMPLAINT:  Chief Complaint  Patient presents with   Follow-up    Rm 7, here with husband Olivia Zamora Pt is here for follow up on migraines. Pt states she has been doing great, has not been having any migraines, only mild headaches right before her next injection is due.     HISTORY OF PRESENT ILLNESS:   UPDATE (06/24/23, VRP): Since last visit, doing well on ajovy and nurtec. Only mild tension HA before next shot of ajovy. Overall doing well. Also had left knee replacement in 06/04/23. [facility] follow up pending.   PRIOR HPI (12/11/22): 52 year old female here for evaluation of headaches.    In 2017 had episode of facial drooping and unilateral weakness.  She went to the hospital for evaluation.  Stroke workup was obtained.  Stroke was ruled out.  She was diagnosed with possible complicated migraine.  She followed up with neurology in [place] where she was living at the time.  Has some history of migraine with aura since childhood.  Headaches typically in the back of her head, sometimes on the right side with throbbing pain, nausea, sensitive to light and sound.  She has tried propranolol, topiramate, gabapentin, Imitrex without relief.  Also has been having some issues with left knee pain (since 2022).  She saw orthopedic clinic and was diagnosed with vascular malformation of the knee.  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  She saw [facility] for evaluation.  She was recommended have additional test including cerebral angiogram and MRI of the thoracic and lumbar spine.  However she could not complete this testing due to financial and insurance reasons.   REVIEW OF SYSTEMS: Full 14 system review of systems  performed and negative with exception of: as per HPI.  ALLERGIES: Allergies  Allergen Reactions   Iodine Anaphylaxis   Iohexol Anaphylaxis    Pre meds given in past with no reaction noted.     Latex Anaphylaxis and Other (See Comments)   Topamax [Topiramate] Anaphylaxis   Zanaflex [Tizanidine] Shortness Of Breath    Swelling, chest pain     HOME MEDICATIONS: Outpatient Medications Prior to Visit  Medication Sig Dispense Refill   albuterol (ACCUNEB) 0.63 MG/3ML nebulizer solution Take 3 mLs (0.63 mg total) by nebulization every 6 (six) hours as needed for wheezing. (Patient taking differently: Take 1 ampule by nebulization every 6 (six) hours as needed for wheezing (allergy).) 75 mL 1   aspirin EC 81 MG tablet Take 1 tablet (81 mg total) by mouth 2 (two) times daily. For 2 weeks then back to once a day for DVT prevention. 30 tablet 11   buPROPion (WELLBUTRIN XL) 300 MG 24 hr tablet Take 1 tablet (300 mg total) by mouth daily. 90 tablet 0   docusate sodium (COLACE) 100 MG capsule Take 1 capsule (100 mg total) by mouth daily. (Patient taking differently: Take 100 mg by mouth 2 (two) times daily.) 90 capsule 1   fluticasone (FLONASE) 50 MCG/ACT nasal spray Place 1 spray into both nostrils 2 (two) times daily.     linaclotide (LINZESS) 72 MCG capsule Take 1 capsule (72 mcg total) by mouth daily before breakfast. 90 capsule 2   losartan (COZAAR) 50 MG tablet Take 1 tablet (50 mg total) by  mouth daily. 90 tablet 0   Multiple Vitamins-Minerals (MULTIVITAMIN ADULT PO) Take 1 tablet by mouth daily.     SYNTHROID 100 MCG tablet Take 100 mcg by mouth every other day.     Tiotropium Bromide Monohydrate (SPIRIVA RESPIMAT) 1.25 MCG/ACT AERS Inhale 2 puffs into the lungs daily. (Patient taking differently: Inhale 2 puffs into the lungs 2 (two) times daily.) 4 g 11   Vitamin D, Ergocalciferol, (DRISDOL) 1.25 MG (50000 UT) CAPS capsule Take 50,000 Units by mouth every 7 (seven) days.     Fremanezumab-vfrm  (AJOVY) 225 MG/1.5ML SOAJ INJECT 1.5ML INTO THE SKIN EVERY 28 DAYS 1.5 mL 4   NURTEC 75 MG TBDP DISSOLVE 1 TABLET BY MOUTH DAILY AS NEEDED 8 tablet 5   zolpidem (AMBIEN) 5 MG tablet Take 1 tablet (5 mg total) by mouth at bedtime as needed for sleep. (Patient not taking: Reported on 06/24/2023) 30 tablet 5   HYDROcodone-acetaminophen (NORCO/VICODIN) 5-325 MG tablet Take 1-2 tablets by mouth every 6 (six) hours as needed for moderate pain or severe pain (post op pain). (Patient not taking: Reported on 06/20/2023) 40 tablet 0   tiZANidine (ZANAFLEX) 4 MG tablet Take 1 tablet (4 mg total) by mouth every 6 (six) hours as needed for muscle spasms. 40 tablet 1   traMADol (ULTRAM) 50 MG tablet Take by mouth every 6 (six) hours as needed.     No facility-administered medications prior to visit.    PAST MEDICAL HISTORY: Past Medical History:  Diagnosis Date   Anxiety    Failed therapy with Paxil, Lexparo, Prozac, Wellbutrin, Xanax, and Ativan   Arthritis    Asthma    Cardiac disease    Angina   Chronic back pain    Constipation 08/26/2019   Depression    Failed therapy with Paxil, Lexparo, Prozac, Wellbutrin, and Ativan   Disease of thyroid gland 12/28/2011   multiple thyroid nodules in 2013 - [place] on 03/08/14 showed heterogeneous appearance of thyroid gland without focal nodule   Eczema    Generalized seizure (HCC)    stress related   GERD (gastroesophageal reflux disease)    History of DVT (deep vein thrombosis) 1988   Hypertension    Hyperthyroidism    Hypothyroidism    IBS (irritable bowel syndrome)    Internal derangement of left knee    Lactose intolerance    Migraine    Sleep apnea    wears CPAP   Vitamin D deficiency     PAST SURGICAL HISTORY: Past Surgical History:  Procedure Laterality Date   CESAREAN SECTION     x3   COLONOSCOPY     HEMANGIOMA EXCISION Right    shoulder   IR RADIOLOGIST EVAL & MGMT  05/04/2021   MASS EXCISION Right    lump from palm of hand    OOPHORECTOMY     OTHER SURGICAL HISTORY Right    Repair of tib-fib fracture   TOTAL ABDOMINAL HYSTERECTOMY     TOTAL KNEE ARTHROPLASTY Left 06/04/2023   Procedure: LEFT TOTAL KNEE ARTHROPLASTY;  Surgeon: Marcene Corning, MD;  Location: [facility];  Service: Orthopedics;  Laterality: Left;   TUBAL LIGATION     TUMOR EXCISION Right    Axilla - benign   VASCULAR SURGERY Right 1989   blood clot removed from neck    FAMILY HISTORY: Family History  Problem Relation Age of Onset   Other Niece        antiphospholipid AB syndrome   Factor V Leiden deficiency  Sister    CVA Sister        11s   Thyroid disease Sister    Breast cancer Sister    Multiple sclerosis Sister    Other Sister        guillain barre   Depression Sister    Migraines Sister    Arthritis Mother    Hypertension Mother    Thyroid disease Mother    Depression Mother    Migraines Mother    Arthritis Father    Hypertension Father    Depression Father    Hypertension Maternal Grandmother    Depression Maternal Grandmother    Anxiety disorder Maternal Grandmother    Hypertension Maternal Grandfather    Hypertension Paternal Grandmother    Hypertension Paternal Grandfather    CVA Sister    Thyroid disease Sister    Depression Sister    Migraines Sister    Asthma Daughter    Depression Daughter    Anxiety disorder Daughter    Irritable bowel syndrome Daughter    Lactose intolerance Daughter    Migraines Daughter    Asthma Son    Depression Son    Anxiety disorder Son    Irritable bowel syndrome Son    Lactose intolerance Son    Migraines Son    Asthma Son    Depression Son    Anxiety disorder Son    Irritable bowel syndrome Son    Lactose intolerance Son    Migraines Son     SOCIAL HISTORY: Social History   Socioeconomic History   Marital status: Married    Spouse name: Not on file   Number of children: Not on file   Years of education: Not on file   Highest education level: Associate degree:  occupational, Scientist, product/process development, or vocational program  Occupational History   Not on file  Tobacco Use   Smoking status: Former    Types: Cigarettes   Smokeless tobacco: Never  Substance and Sexual Activity   Alcohol use: Never   Drug use: Never   Sexual activity: Not on file  Other Topics Concern   Not on file  Social History Narrative   Not on file   Social Determinants of Health   Financial Resource Strain: Low Risk  (05/07/2023)   Overall Financial Resource Strain (CARDIA)    Difficulty of Paying Living Expenses: Not hard at all  Food Insecurity: No Food Insecurity (05/07/2023)   Hunger Vital Sign    Worried About Running Out of Food in the Last Year: Never true    Ran Out of Food in the Last Year: Never true  Transportation Needs: No Transportation Needs (05/07/2023)   PRAPARE - Administrator, Civil Service (Medical): No    Lack of Transportation (Non-Medical): No  Physical Activity: Sufficiently Active (05/07/2023)   Exercise Vital Sign    Days of Exercise per Week: 5 days    Minutes of Exercise per Session: 30 min  Stress: No Stress Concern Present (05/07/2023)   [employer] of Occupational Health - Occupational Stress Questionnaire    Feeling of Stress : Not at all  Social Connections: Moderately Integrated (05/07/2023)   Social Connection and Isolation Panel [NHANES]    Frequency of Communication with Friends and Family: More than three times a week    Frequency of Social Gatherings with Friends and Family: Once a week    Attends Religious Services: 1 to 4 times per year    Active Member of Clubs or  Organizations: No    Attends Engineer, structural: Not on file    Marital Status: Married  Catering manager Violence: Not on file     PHYSICAL EXAM  GENERAL EXAM/CONSTITUTIONAL: Vitals:  Vitals:   06/24/23 1521  BP: 120/79  Pulse: 90  Weight: 182 lb (82.6 kg)  Height: 5\' 7"  (1.702 m)    Body mass index is 28.51 kg/m. Wt Readings from  Last 3 Encounters:  06/24/23 182 lb (82.6 kg)  06/04/23 185 lb (83.9 kg)  05/23/23 185 lb (83.9 kg)   Patient is in no distress; well developed, nourished and groomed; neck is supple  CARDIOVASCULAR: Examination of carotid arteries is normal; no carotid bruits Regular rate and rhythm, no murmurs Examination of peripheral vascular system by observation and palpation is normal  EYES: Ophthalmoscopic exam of optic discs and posterior segments is normal; no papilledema or hemorrhages No results found.   MUSCULOSKELETAL: Gait, strength, tone, movements noted in Neurologic exam below  NEUROLOGIC: MENTAL STATUS:      No data to display         awake, alert, oriented to person, place and time recent and remote memory intact normal attention and concentration language fluent, comprehension intact, naming intact fund of knowledge appropriate  CRANIAL NERVE:  2nd - no papilledema on fundoscopic exam 2nd, 3rd, 4th, 6th - pupils equal and reactive to light, visual fields full to confrontation, extraocular muscles intact, no nystagmus 5th - facial sensation symmetric 7th - facial strength symmetric 8th - hearing intact 9th - palate elevates symmetrically, uvula midline 11th - shoulder shrug symmetric 12th - tongue protrusion midline  MOTOR:  normal bulk and tone, full strength in the BUE, BLE  SENSORY:  normal and symmetric to light touch, temperature, vibration  COORDINATION:  finger-nose-finger, fine finger movements normal  REFLEXES:  deep tendon reflexes present and symmetric  GAIT/STATION:  narrow based gait     DIAGNOSTIC DATA (LABS, IMAGING, TESTING) - I reviewed patient records, labs, notes, testing and imaging myself where available.  Lab Results  Component Value Date   WBC 4.5 06/23/2023   HGB 12.8 06/23/2023   HCT 39.0 06/23/2023   MCV 94.2 06/23/2023   PLT 199 06/23/2023      Component Value Date/Time   NA 144 06/23/2023 0545   NA 145 (H)  05/09/2023 1127   K 3.2 (L) 06/23/2023 0545   CL 109 06/23/2023 0545   CO2 22 06/23/2023 0545   GLUCOSE 107 (H) 06/23/2023 0545   BUN 13 06/23/2023 0545   BUN 12 05/09/2023 1127   CREATININE 0.66 06/23/2023 0545   CALCIUM 9.4 06/23/2023 0545   PROT 7.1 06/23/2023 0535   PROT 6.4 05/09/2023 1127   ALBUMIN 4.4 06/23/2023 0535   ALBUMIN 4.5 05/09/2023 1127   AST 22 06/23/2023 0535   ALT 18 06/23/2023 0535   ALKPHOS 72 06/23/2023 0535   BILITOT 1.0 06/23/2023 0535   BILITOT 0.8 05/09/2023 1127   GFRNONAA >60 06/23/2023 0545   GFRAA 123 08/21/2019 1428   Lab Results  Component Value Date   CHOL 181 05/09/2023   HDL 37 (L) 05/09/2023   LDLCALC 113 (H) 05/09/2023   TRIG 176 (H) 05/09/2023   CHOLHDL 4.9 (H) 05/09/2023   Lab Results  Component Value Date   HGBA1C 5.4 05/09/2023   Lab Results  Component Value Date   VITAMINB12 319 04/05/2022   Lab Results  Component Value Date   TSH 0.551 05/09/2023   04/06/22 MRI brain [I  reviewed images myself and agree with interpretation. -VRP]  1. No acute intracranial abnormality. Normal for age non contrast MRI appearance of the brain. No evidence of demyelinating disease. 2. 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    ASSESSMENT AND PLAN  52 y.o. year old female here with:  Dx:  1. Migraine with aura and without status migrainosus, not intractable      PLAN:   HEADACHES / MIGRAINES - tried and failed inderal, topiramate, gabapentin, imitrex - continue ajovy injection for migraine prevention - continue nurtec 75mg  as needed for migraine rescue  MULTIPLE VASCULAR MALFORMATIONS (could be part of underlying genetic syndrome) - follow up with [facility] (likely needs  conventional cerebral angiogram)  LEFT KNEE PAIN (with underlying vascular malformation and meniscus tear) - s/p knee replacement on 06/04/23  Meds ordered this encounter  Medications   Fremanezumab-vfrm (AJOVY) 225 MG/1.5ML SOAJ    Sig: Inject 225 mg into the skin every 30 (thirty) days.    Dispense:  1.5 mL    Refill:  4   Rimegepant Sulfate (NURTEC) 75 MG TBDP    Sig: Take 1 tablet (75 mg total) by mouth as needed (migraine).    Dispense:  8 tablet    Refill:  12   Return in about 1 year (around 06/23/2024) for MyChart visit (15 min).      Suanne Marker, MD 06/24/2023, 3:43 PM Certified in Neurology, Neurophysiology and Neuroimaging  [facility] [address], [address] [place], [place] [address] [phone]

## 2023-06-24 NOTE — Telephone Encounter (Signed)
Transition Care Management Follow-up Telephone Call Date of discharge and from where: 06/23/2023 [facility] How have you been since you were released from the hospital? Patient states that she is feeling much better Any questions or concerns? No  Items Reviewed: Did the pt receive and understand the discharge instructions provided? Yes  Medications obtained and verified? Yes  Other?  Patient has stopped taking Zanaflex Any new allergies since your discharge? No  Dietary orders reviewed? Yes Do you have support at home? Yes   Home Care and Equipment/Supplies: Were home health services ordered? not applicable If so, what is the name of the agency? NA  Has the agency set up a time to come to the patient's home? not applicable Were any new equipment or medical supplies ordered?  No What is the name of the medical supply agency? NA Were you able to get the supplies/equipment? not applicable Do you have any questions related to the use of the equipment or supplies? No  Functional Questionnaire: (I = Independent and D = Dependent) ADLs: I  Bathing/Dressing- I  Meal Prep- I  Eating- I  Maintaining continence- I  Transferring/Ambulation- I  Managing Meds- I  Follow up appointments reviewed:  PCP Hospital f/u appt confirmed? Yes  Scheduled to see Rakes on 07/02/2023 @ 1150am for chronic follow up will discuss ED visit at this time or will follow sooner as needed . Specialist Hospital f/u appt confirmed? No   Are transportation arrangements needed? No  If their condition worsens, is the pt aware to call PCP or go to the Emergency Dept.? Yes Was the patient provided with contact information for the PCP's office or ED? Yes Was to pt encouraged to call back with questions or concerns? Yes

## 2023-06-25 ENCOUNTER — Ambulatory Visit: Payer: BLUE CROSS/BLUE SHIELD

## 2023-06-25 DIAGNOSIS — R600 Localized edema: Secondary | ICD-10-CM

## 2023-06-25 DIAGNOSIS — M25562 Pain in left knee: Secondary | ICD-10-CM | POA: Diagnosis not present

## 2023-06-25 DIAGNOSIS — M25662 Stiffness of left knee, not elsewhere classified: Secondary | ICD-10-CM

## 2023-06-25 DIAGNOSIS — G8929 Other chronic pain: Secondary | ICD-10-CM

## 2023-06-25 DIAGNOSIS — M6281 Muscle weakness (generalized): Secondary | ICD-10-CM

## 2023-06-25 NOTE — Therapy (Signed)
OUTPATIENT PHYSICAL THERAPY LOWER EXTREMITY TREATMENT   Patient Name: Olivia Zamora MRN: [id number] DOB:[birth year], 52 y.o., female Today's Date: 06/25/2023  END OF SESSION:  PT End of Session - 06/25/23 1319     Visit Number 2    Number of Visits 12    Date for PT Re-Evaluation 08/01/23    Authorization Type FOTO.    PT Start Time 1258    PT Stop Time 1352    PT Time Calculation (min) 54 min    Activity Tolerance Patient tolerated treatment well    Behavior During Therapy WFL for tasks assessed/performed              Past Medical History:  Diagnosis Date   Anxiety    Failed therapy with Paxil, Lexparo, Prozac, Wellbutrin, Xanax, and Ativan   Arthritis    Asthma    Cardiac disease    Angina   Chronic back pain    Constipation 08/26/2019   Depression    Failed therapy with Paxil, Lexparo, Prozac, Wellbutrin, and Ativan   Disease of thyroid gland 12/28/2011   multiple thyroid nodules in 2013 - [place] on 03/08/14 showed heterogeneous appearance of thyroid gland without focal nodule   Eczema    Generalized seizure (HCC)    stress related   GERD (gastroesophageal reflux disease)    History of DVT (deep vein thrombosis) 1988   Hypertension    Hyperthyroidism    Hypothyroidism    IBS (irritable bowel syndrome)    Internal derangement of left knee    Lactose intolerance    Migraine    Sleep apnea    wears CPAP   Vitamin D deficiency    Past Surgical History:  Procedure Laterality Date   CESAREAN SECTION     x3   COLONOSCOPY     HEMANGIOMA EXCISION Right    shoulder   IR RADIOLOGIST EVAL & MGMT  05/04/2021   MASS EXCISION Right    lump from palm of hand   OOPHORECTOMY     OTHER SURGICAL HISTORY Right    Repair of tib-fib fracture   TOTAL ABDOMINAL HYSTERECTOMY     TOTAL KNEE ARTHROPLASTY Left 06/04/2023   Procedure: LEFT TOTAL KNEE ARTHROPLASTY;  Surgeon: Marcene Corning, MD;  Location: [facility];  Service: Orthopedics;  Laterality: Left;   TUBAL LIGATION      TUMOR EXCISION Right    Axilla - benign   VASCULAR SURGERY Right 1989   blood clot removed from neck   Patient Active Problem List   Diagnosis Date Noted   Mixed hyperlipidemia 05/09/2023   Essential hypertension 11/21/2022   Primary insomnia 11/21/2022   Controlled substance agreement signed 11/21/2022   Acquired hypothyroidism 11/21/2022   H/O: CVA (cerebrovascular accident) 08/01/2021   History of DVT (deep vein thrombosis) 08/01/2021   Symptomatic mammary hypertrophy 06/13/2021   IBS (irritable bowel syndrome) 08/26/2019   Vitamin D deficiency    Sleep apnea    Depression    Anxiety    Asthma    Disease of thyroid gland 12/28/2011   REFERRING PROVIDER: Marcene Corning MD  REFERRING DIAG: S/p left total knee replacement.  THERAPY DIAG:  Chronic pain of left knee  Stiffness of left knee, not elsewhere classified  Localized edema  Muscle weakness (generalized)  Rationale for Evaluation and Treatment: Rehabilitation  ONSET DATE: Ongoing. Surgery (06/04/23).  SUBJECTIVE:   SUBJECTIVE STATEMENT: Patient reports that her knee is stiff today. She notes that she was able to do a prone hang  for about 14 minutes.   PERTINENT HISTORY: Please see above. PAIN:  Are you having pain? Yes: NPRS scale: 6/10 Pain location: Left knee. Pain description: Ache, throb, sore. Aggravating factors: "Everything." Relieving factors: 'Not much."  PRECAUTIONS: Other: No ultrasound.   WEIGHT BEARING RESTRICTIONS: No  FALLS:  Has patient fallen in last 6 months? No  LIVING ENVIRONMENT: Lives with: lives with their spouse Lives in: House/apartment Stairs: Yes.  Non-reciprocating pattern. Has following equipment at home:  Standard walker.  PLOF: Independent with basic ADLs  PATIENT GOALS: Get her life back.  Return to gym workouts which she loved.   OBJECTIVE:   PATIENT SURVEYS:  FOTO .  EDEMA:  Circumferential: Right 5 cms > left.  PALPATION: C/o diffuse left knee  pain currently.  Incision appears to be healing well.  LOWER EXTREMITY ROM:  In supine: Gentle passive extension to -15 degrees.  Seated flexion to 70 degrees and to 75 degrees after 5 minutes on the Nustep.  LOWER EXTREMITY MMT:  Patient unable to perform an active antigravity left SLR currently.  She has a decrease in volitional activation of her left quadriceps musculature.    GAIT: The patient is walking safely with a standard walker with good advancement of her left LE.  TODAY'S TREATMENT:                                                                                                                              DATE:                                    06/25/23 EXERCISE LOG  Exercise Repetitions and Resistance Comments  Nustep  L3 x 20 minutes; seat 9-8   Gastroc stretch  4 x 30 seconds   Lunges onto step  6" step x 3 minutes Added to HEP   Standing HS curl  4 x 30 seconds  Added to HEP   Thomas stretch  3 minutes Added to HEP   Supine quad set  3 minutes w/ 5 second hold    Blank cell = exercise not performed today  Modalities: no adverse reaction to today's modalities  Date:  Vaso: Knee, 34 degrees; low pressure, 5 mins, Pain and Edema  PATIENT EDUCATION:  Education details: HEP, healing, prognosis, anatomy, swelling, and expectation for sorenss Person educated: Patient and Spouse Education method: Explanation and Handouts Education comprehension: verbalized understanding  HOME EXERCISE PROGRAM: [id number]  ASSESSMENT:  CLINICAL IMPRESSION: Patient was introduced to multiple new interventions for improved knee mobility. She required minimal cueing with these new interventions for proper positioning to facilitate increased joint mobility and soft tissue extensibility. She was educated on the expectation for soreness and healing following a total knee replacement. Her HEP was updated with today's new interventions and she reported feeling comfortable with these new  exercises. She reported that her knee felt better  upon the conclusion of treatment. She continues to require skilled physical therapy to address her remaining impairments to return to her prior level of function.  OBJECTIVE IMPAIRMENTS: Abnormal gait, decreased activity tolerance, decreased mobility, decreased ROM, decreased strength, increased edema, and pain.   ACTIVITY LIMITATIONS: lifting, transfers, bed mobility, and locomotion level  PARTICIPATION LIMITATIONS: meal prep, cleaning, and laundry  PERSONAL FACTORS: Time since onset of injury/illness/exacerbation are also affecting patient's functional outcome.   REHAB POTENTIAL: Excellent  CLINICAL DECISION MAKING: Stable/uncomplicated  EVALUATION COMPLEXITY: Low   GOALS:  SHORT TERM GOALS: Target date: 07/04/23  Ind with an initial HEP. Goal status: INITIAL  2.  Full active left knee extension in order to normalize gait.  Goal status: INITIAL  3.  Active left knee flexion to 90 degrees.  Goal status: INITIAL  LONG TERM GOALS: Target date: 08/01/23.  Ind with an advanced HEP.  Goal status: INITIAL  2.  Active left knee flexion to 115 degrees+ so the patient can perform functional tasks and do so with pain not > 2-3/10.  Goal status: INITIAL  3.  Increase left hip and knee strength to a solid 4+/5 to provide good stability for accomplishment of functional activities.  Goal status: INITIAL  4.  Perform a reciprocating stair gait with one railing with pain not > 2-3/10.  Goal status: INITIAL  PLAN:  PT FREQUENCY:  2-3 times a week  PT DURATION: 6 weeks  PLANNED INTERVENTIONS: Therapeutic exercises, Therapeutic activity, Neuromuscular re-education, Gait training, Patient/Family education, Self Care, Electrical stimulation, Cryotherapy, Moist heat, Vasopneumatic device, and Manual therapy  PLAN FOR NEXT SESSION: Nustep, PROM, review HEP.  VMS to left quadriceps.  TKA protocol progression.   LE elevation and  vasopneumatic    Granville Lewis, PT 06/25/2023, 6:14 PM

## 2023-07-02 ENCOUNTER — Ambulatory Visit: Payer: BLUE CROSS/BLUE SHIELD | Admitting: Family Medicine

## 2023-07-02 ENCOUNTER — Encounter: Payer: Self-pay | Admitting: Family Medicine

## 2023-07-02 ENCOUNTER — Encounter: Payer: Self-pay | Admitting: Physical Therapy

## 2023-07-02 ENCOUNTER — Ambulatory Visit: Payer: BLUE CROSS/BLUE SHIELD | Admitting: Physical Therapy

## 2023-07-02 VITALS — BP 123/79 | HR 94 | Temp 97.8°F | Ht 67.0 in | Wt 181.6 lb

## 2023-07-02 DIAGNOSIS — F5101 Primary insomnia: Secondary | ICD-10-CM

## 2023-07-02 DIAGNOSIS — G8929 Other chronic pain: Secondary | ICD-10-CM

## 2023-07-02 DIAGNOSIS — M25662 Stiffness of left knee, not elsewhere classified: Secondary | ICD-10-CM

## 2023-07-02 DIAGNOSIS — G4733 Obstructive sleep apnea (adult) (pediatric): Secondary | ICD-10-CM | POA: Diagnosis not present

## 2023-07-02 DIAGNOSIS — M25562 Pain in left knee: Secondary | ICD-10-CM | POA: Diagnosis not present

## 2023-07-02 DIAGNOSIS — M6281 Muscle weakness (generalized): Secondary | ICD-10-CM

## 2023-07-02 DIAGNOSIS — R600 Localized edema: Secondary | ICD-10-CM

## 2023-07-02 MED ORDER — TRAZODONE HCL 50 MG PO TABS
25.0000 mg | ORAL_TABLET | Freq: Every evening | ORAL | 3 refills | Status: DC | PRN
Start: 2023-07-02 — End: 2023-08-07

## 2023-07-02 NOTE — Progress Notes (Signed)
Subjective:  [name], female    DOB: July 09, 1971, 52 y.o.   MRN: [id number]  Patient Care Team: Sonny Masters, FNP as PCP - General (Family Medicine)   Chief Complaint:  trouble sleeping  (Ambien is not working and would like something else sent in )   HPI: Olivia Zamora is a 52 y.o. female presenting on 07/02/2023 for trouble sleeping  (Ambien is not working and would like something else sent in )  Insomnia Has a hard going to sleep can't stop her mind from running and when she goes to sleep she Sleeps only a couple of hours. States she wakes up with headaches.   Pt states she snores and has sleep apnea. CPAP was recalled 2 years ago. Ambien not working. Has tried Restoril Benadryl. Pt states she is restless prior to bed. Restless leg and leg pain. Complicate sleping FHX of insomnia.  Sleep apnea Pt states that she would like to get back on CPAP because hers was recalled 2 years ago and she hasn't gotten it replaced. Husband states she pauses breathing when she sleeps and snores. Pt states she snores and has sleep apnea. Pt reports that she wakes up with headache.   Relevant past medical, surgical, family, and social history reviewed and updated as indicated.  Allergies and medications reviewed and updated. Data reviewed: Chart in Epic.   Past Medical History:  Diagnosis Date   Anxiety    Failed therapy with Paxil, Lexparo, Prozac, Wellbutrin, Xanax, and Ativan   Arthritis    Asthma    Cardiac disease    Angina   Chronic back pain    Constipation 08/26/2019   Depression    Failed therapy with Paxil, Lexparo, Prozac, Wellbutrin, and Ativan   Disease of thyroid gland 12/28/2011   multiple thyroid nodules in 2013 - [place] on 03/08/14 showed heterogeneous appearance of thyroid gland without focal nodule   Eczema    Generalized seizure (HCC)    stress related   GERD (gastroesophageal reflux disease)    History of DVT (deep vein thrombosis) 1988   Hypertension     Hyperthyroidism    Hypothyroidism    IBS (irritable bowel syndrome)    Internal derangement of left knee    Lactose intolerance    Migraine    Sleep apnea    wears CPAP   Vitamin D deficiency     Past Surgical History:  Procedure Laterality Date   CESAREAN SECTION     x3   COLONOSCOPY     HEMANGIOMA EXCISION Right    shoulder   IR RADIOLOGIST EVAL & MGMT  05/04/2021   MASS EXCISION Right    lump from palm of hand   OOPHORECTOMY     OTHER SURGICAL HISTORY Right    Repair of tib-fib fracture   TOTAL ABDOMINAL HYSTERECTOMY     TOTAL KNEE ARTHROPLASTY Left 06/04/2023   Procedure: LEFT TOTAL KNEE ARTHROPLASTY;  Surgeon: Marcene Corning, MD;  Location: [facility];  Service: Orthopedics;  Laterality: Left;   TUBAL LIGATION     TUMOR EXCISION Right    Axilla - benign   VASCULAR SURGERY Right 1989   blood clot removed from neck    Social History   Socioeconomic History   Marital status: Married    Spouse name: Not on file   Number of children: Not on file   Years of education: Not on file   Highest education level: Associate degree: occupational, Scientist, product/process development, or vocational  program  Occupational History   Not on file  Tobacco Use   Smoking status: Former    Types: Cigarettes   Smokeless tobacco: Never  Substance and Sexual Activity   Alcohol use: Never   Drug use: Never   Sexual activity: Not on file  Other Topics Concern   Not on file  Social History Narrative   Not on file   Social Determinants of Health   Financial Resource Strain: Low Risk  (05/07/2023)   Overall Financial Resource Strain (CARDIA)    Difficulty of Paying Living Expenses: Not hard at all  Food Insecurity: No Food Insecurity (05/07/2023)   Hunger Vital Sign    Worried About Running Out of Food in the Last Year: Never true    Ran Out of Food in the Last Year: Never true  Transportation Needs: No Transportation Needs (05/07/2023)   PRAPARE - Administrator, Civil Service (Medical): No     Lack of Transportation (Non-Medical): No  Physical Activity: Sufficiently Active (05/07/2023)   Exercise Vital Sign    Days of Exercise per Week: 5 days    Minutes of Exercise per Session: 30 min  Stress: No Stress Concern Present (05/07/2023)   [employer] of Occupational Health - Occupational Stress Questionnaire    Feeling of Stress : Not at all  Social Connections: Moderately Integrated (05/07/2023)   Social Connection and Isolation Panel [NHANES]    Frequency of Communication with Friends and Family: More than three times a week    Frequency of Social Gatherings with Friends and Family: Once a week    Attends Religious Services: 1 to 4 times per year    Active Member of [employer] or Organizations: No    Attends Banker Meetings: Not on file    Marital Status: Married  Intimate Partner Violence: Not on file    Outpatient Encounter Medications as of 07/02/2023  Medication Sig   albuterol (ACCUNEB) 0.63 MG/3ML nebulizer solution Take 3 mLs (0.63 mg total) by nebulization every 6 (six) hours as needed for wheezing. (Patient taking differently: Take 1 ampule by nebulization every 6 (six) hours as needed for wheezing (allergy).)   aspirin EC 81 MG tablet Take 1 tablet (81 mg total) by mouth 2 (two) times daily. For 2 weeks then back to once a day for DVT prevention.   buPROPion (WELLBUTRIN XL) 300 MG 24 hr tablet Take 1 tablet (300 mg total) by mouth daily.   docusate sodium (COLACE) 100 MG capsule Take 1 capsule (100 mg total) by mouth daily. (Patient taking differently: Take 100 mg by mouth 2 (two) times daily.)   fluticasone (FLONASE) 50 MCG/ACT nasal spray Place 1 spray into both nostrils 2 (two) times daily.   Fremanezumab-vfrm (AJOVY) 225 MG/1.5ML SOAJ Inject 225 mg into the skin every 30 (thirty) days.   linaclotide (LINZESS) 72 MCG capsule Take 1 capsule (72 mcg total) by mouth daily before breakfast.   losartan (COZAAR) 50 MG tablet Take 1 tablet (50 mg total) by mouth  daily.   Multiple Vitamins-Minerals (MULTIVITAMIN ADULT PO) Take 1 tablet by mouth daily.   Rimegepant Sulfate (NURTEC) 75 MG TBDP Take 1 tablet (75 mg total) by mouth as needed (migraine).   SYNTHROID 100 MCG tablet Take 100 mcg by mouth every other day.   Tiotropium Bromide Monohydrate (SPIRIVA RESPIMAT) 1.25 MCG/ACT AERS Inhale 2 puffs into the lungs daily. (Patient taking differently: Inhale 2 puffs into the lungs 2 (two) times daily.)   traZODone (  DESYREL) 50 MG tablet Take 0.5-1 tablets (25-50 mg total) by mouth at bedtime as needed for sleep.   Vitamin D, Ergocalciferol, (DRISDOL) 1.25 MG (50000 UT) CAPS capsule Take 50,000 Units by mouth every 7 (seven) days.   [DISCONTINUED] zolpidem (AMBIEN) 5 MG tablet Take 1 tablet (5 mg total) by mouth at bedtime as needed for sleep. (Patient not taking: Reported on 06/24/2023)   No facility-administered encounter medications on file as of 07/02/2023.    Allergies  Allergen Reactions   Iodine Anaphylaxis   Iohexol Anaphylaxis    Pre meds given in past with no reaction noted.     Latex Anaphylaxis and Other (See Comments)   Topamax [Topiramate] Anaphylaxis   Zanaflex [Tizanidine] Shortness Of Breath    Swelling, chest pain     Review of Systems  Constitutional:  Negative for activity change, appetite change and unexpected weight change.  HENT: Negative.  Negative for sore throat.   Eyes: Negative.   Respiratory: Negative.    Cardiovascular: Negative.   Gastrointestinal: Negative.   Endocrine: Negative.   Genitourinary: Negative.  Negative for difficulty urinating, dyspareunia and dysuria.  Musculoskeletal:  Positive for joint swelling (recent surg to Left knee).  Skin: Negative.   Allergic/Immunologic: Negative.   Neurological: Negative.  Negative for dizziness, tremors and weakness.  Hematological: Negative.   Psychiatric/Behavioral:  Positive for sleep disturbance. Negative for agitation, confusion, self-injury and suicidal ideas. The  patient is not nervous/anxious and is not hyperactive.   All other systems reviewed and are negative.       Objective:  BP 123/79   Pulse 94   Temp 97.8 F (36.6 C) (Temporal)   Ht 5\' 7"  (1.702 m)   Wt 181 lb 9.6 oz (82.4 kg)   SpO2 98%   BMI 28.44 kg/m    Wt Readings from Last 3 Encounters:  07/02/23 181 lb 9.6 oz (82.4 kg)  06/24/23 182 lb (82.6 kg)  06/04/23 185 lb (83.9 kg)    Physical Exam Vitals and nursing note reviewed.  Constitutional:      Appearance: She is normal weight.  HENT:     Head: Normocephalic and atraumatic.     Right Ear: Tympanic membrane normal.     Left Ear: Tympanic membrane normal.     Nose: Nose normal.     Mouth/Throat:     Mouth: Mucous membranes are moist.  Eyes:     Conjunctiva/sclera: Conjunctivae normal.     Pupils: Pupils are equal, round, and reactive to light.  Cardiovascular:     Rate and Rhythm: Normal rate and regular rhythm.     Heart sounds: Normal heart sounds.  Pulmonary:     Effort: Pulmonary effort is normal.     Breath sounds: Normal breath sounds.  Abdominal:     General: Abdomen is flat.  Musculoskeletal:        General: Normal range of motion.     Cervical back: Normal range of motion.     Comments: Well healed surgical scar to left anterior knee  Skin:    General: Skin is warm and dry.     Capillary Refill: Capillary refill takes less than 2 seconds.  Neurological:     General: No focal deficit present.     Mental Status: She is alert.  Psychiatric:        Mood and Affect: Mood normal.        Behavior: Behavior normal.        Thought Content: Thought content  normal.        Judgment: Judgment normal.     Results for orders placed or performed during the hospital encounter of 06/23/23  Basic metabolic panel  Result Value Ref Range   Sodium 144 135 - 145 mmol/L   Potassium 3.2 (L) 3.5 - 5.1 mmol/L   Chloride 109 98 - 111 mmol/L   CO2 22 22 - 32 mmol/L   Glucose, Bld 107 (H) 70 - 99 mg/dL   BUN 13 6  - 20 mg/dL   Creatinine, Ser 2.72 0.44 - 1.00 mg/dL   Calcium 9.4 8.9 - 53.6 mg/dL   GFR, Estimated >64 >40 mL/min   Anion gap 13 5 - 15  CBC  Result Value Ref Range   WBC 4.5 4.0 - 10.5 K/uL   RBC 4.14 3.87 - 5.11 MIL/uL   Hemoglobin 12.8 12.0 - 15.0 g/dL   HCT 34.7 42.5 - 95.6 %   MCV 94.2 80.0 - 100.0 fL   MCH 30.9 26.0 - 34.0 pg   MCHC 32.8 30.0 - 36.0 g/dL   RDW 38.7 56.4 - 33.2 %   Platelets 199 150 - 400 K/uL   nRBC 0.0 0.0 - 0.2 %  Lipase, blood  Result Value Ref Range   Lipase 27 11 - 51 U/L  Hepatic function panel  Result Value Ref Range   Total Protein 7.1 6.5 - 8.1 g/dL   Albumin 4.4 3.5 - 5.0 g/dL   AST 22 15 - 41 U/L   ALT 18 0 - 44 U/L   Alkaline Phosphatase 72 38 - 126 U/L   Total Bilirubin 1.0 0.3 - 1.2 mg/dL   Bilirubin, Direct 0.2 0.0 - 0.2 mg/dL   Indirect Bilirubin 0.8 0.3 - 0.9 mg/dL  Troponin I (High Sensitivity)  Result Value Ref Range   Troponin I (High Sensitivity) <2 <18 ng/L  Troponin I (High Sensitivity)  Result Value Ref Range   Troponin I (High Sensitivity) <2 <18 ng/L       Pertinent labs & imaging results that were available during my care of the patient were reviewed by me and considered in my medical decision making.  Assessment & Plan:  Terasa was seen today for trouble sleeping .  Diagnoses and all orders for this visit:  Primary insomnia -     traZODone (DESYREL) 50 MG tablet; Take 0.5-1 tablets (25-50 mg total) by mouth at bedtime as needed for sleep. -     3-5mg  Melatonin 30 min before bedtime   Obstructive sleep apnea syndrome -     Ambulatory referral to Sleep Studies  The problem of recurrent insomnia is discussed. Avoidance of caffeine sources is strongly encouraged. Sleep hygiene issues are reviewed. The use of sedative hypnotics for temporary relief is appropriate; we discussed the addictive nature of these drugs, and a one-time only prescription for prn use of a hypnotic is given, to use no more than 3 times per week  for 2-3 weeks.  - Consult to [facility] [place] will call patient with appointment Continue all other maintenance medications.  Follow up plan: Return in 6 weeks (on 08/13/2023), or if symptoms worsen or fail to improve, for insomnia .  Continue healthy lifestyle choices, including diet (rich in fruits, vegetables, and lean proteins, and low in salt and simple carbohydrates) and exercise (at least 30 minutes of moderate physical activity daily).  The above assessment and management plan was discussed with the patient. The patient verbalized understanding of and has  agreed to the management plan. Patient is aware to call the clinic if they develop any new symptoms or if symptoms persist or worsen. Patient is aware when to return to the clinic for a follow-up visit. Patient educated on when it is appropriate to go to the emergency department.   Maryelizabeth Kaufmann NP student [facility] [phone]  I personally was present during the history, physical exam, and medical decision-making activities of this visit and have verified that the services and findings are accurately documented in the nurse practitioner student's note.  Kari Baars, FNP-C [facility] [address] [place], [place] [address] [phone]

## 2023-07-02 NOTE — Therapy (Signed)
OUTPATIENT PHYSICAL THERAPY LOWER EXTREMITY TREATMENT   Patient Name: Olivia Zamora MRN: [id number] DOB:1971/03/02, 52 y.o., female Today's Date: 07/02/2023  END OF SESSION:  PT End of Session - 07/02/23 1301     Visit Number 3    Number of Visits 12    Date for PT Re-Evaluation 08/01/23    Authorization Type FOTO.    PT Start Time 1301    PT Stop Time 1353    PT Time Calculation (min) 52 min    Activity Tolerance Patient tolerated treatment well    Behavior During Therapy WFL for tasks assessed/performed            Past Medical History:  Diagnosis Date   Anxiety    Failed therapy with Paxil, Lexparo, Prozac, Wellbutrin, Xanax, and Ativan   Arthritis    Asthma    Cardiac disease    Angina   Chronic back pain    Constipation 08/26/2019   Depression    Failed therapy with Paxil, Lexparo, Prozac, Wellbutrin, and Ativan   Disease of thyroid gland 12/28/2011   multiple thyroid nodules in 2013 - [place] on 03/08/14 showed heterogeneous appearance of thyroid gland without focal nodule   Eczema    Generalized seizure (HCC)    stress related   GERD (gastroesophageal reflux disease)    History of DVT (deep vein thrombosis) 1988   Hypertension    Hyperthyroidism    Hypothyroidism    IBS (irritable bowel syndrome)    Internal derangement of left knee    Lactose intolerance    Migraine    Sleep apnea    wears CPAP   Vitamin D deficiency    Past Surgical History:  Procedure Laterality Date   CESAREAN SECTION     x3   COLONOSCOPY     HEMANGIOMA EXCISION Right    shoulder   IR RADIOLOGIST EVAL & MGMT  05/04/2021   MASS EXCISION Right    lump from palm of hand   OOPHORECTOMY     OTHER SURGICAL HISTORY Right    Repair of tib-fib fracture   TOTAL ABDOMINAL HYSTERECTOMY     TOTAL KNEE ARTHROPLASTY Left 06/04/2023   Procedure: LEFT TOTAL KNEE ARTHROPLASTY;  Surgeon: Marcene Corning, MD;  Location: [facility];  Service: Orthopedics;  Laterality: Left;   TUBAL LIGATION     TUMOR  EXCISION Right    Axilla - benign   VASCULAR SURGERY Right 1989   blood clot removed from neck   Patient Active Problem List   Diagnosis Date Noted   Mixed hyperlipidemia 05/09/2023   Essential hypertension 11/21/2022   Primary insomnia 11/21/2022   Controlled substance agreement signed 11/21/2022   Acquired hypothyroidism 11/21/2022   H/O: CVA (cerebrovascular accident) 08/01/2021   History of DVT (deep vein thrombosis) 08/01/2021   Symptomatic mammary hypertrophy 06/13/2021   IBS (irritable bowel syndrome) 08/26/2019   Vitamin D deficiency    Sleep apnea    Depression    Anxiety    Asthma    Disease of thyroid gland 12/28/2011   REFERRING PROVIDER: Marcene Corning MD  REFERRING DIAG: S/p left total knee replacement.  THERAPY DIAG:  Chronic pain of left knee  Stiffness of left knee, not elsewhere classified  Localized edema  Muscle weakness (generalized)  Rationale for Evaluation and Treatment: Rehabilitation  ONSET DATE: Ongoing. Surgery (06/04/23).  SUBJECTIVE:   SUBJECTIVE STATEMENT: Had severe side effects from Xanaflex and went to ED. Has stopped the Xanaflex and not taking anything but Tylenol. Having  more pain now and trying to walk more without an AD.  PERTINENT HISTORY: Please see above.  PAIN:  Are you having pain? Yes: NPRS scale: 9/10 Pain location: Left knee. Pain description: Ache, throb, sore. Aggravating factors: "Everything." Relieving factors: 'Not much."  PRECAUTIONS: Other: No ultrasound.  WEIGHT BEARING RESTRICTIONS: No  FALLS:  Has patient fallen in last 6 months? No  LIVING ENVIRONMENT: Lives with: lives with their spouse Lives in: House/apartment Stairs: Yes.  Non-reciprocating pattern. Has following equipment at home:  Standard walker.  PLOF: Independent with basic ADLs  PATIENT GOALS: Get her life back.  Return to gym workouts which she loved.  OBJECTIVE:   PATIENT SURVEYS:  FOTO .  EDEMA:  Circumferential: Right 5  cms > left.  PALPATION: C/o diffuse left knee pain currently.  Incision appears to be healing well.  LOWER EXTREMITY ROM:   Active  Left eval Left 07/02/23  Hip flexion    Hip extension    Hip abduction    Hip adduction    Hip internal rotation    Hip external rotation    Knee flexion 75 85  Knee extension -15 -12  Ankle dorsiflexion    Ankle plantarflexion    Ankle inversion    Ankle eversion     (Blank rows = not tested)   LOWER EXTREMITY MMT: Patient unable to perform an active antigravity left SLR currently.  She has a decrease in volitional activation of her left quadriceps musculature.   GAIT: The patient is walking safely with a standard walker with good advancement of her left LE.  TODAY'STREATMENT:                                                                                                                              DATE: 07/02/23 EXERCISE LOG  Exercise Repetitions and Resistance Comments  Nustep  L1 x 16 minutes; seat 8-7   Slant board stretch X2 min   Lunges onto step  8" step x 3 minutes   Forward step up 6" step x20 reps   Step down 4" step x20 reps   Heel raises X20 reps   Heel prop into ext X2 min    Blank cell = exercise not performed today  Modalities: no adverse reaction to today's modalities  Date:  Vaso: Knee, 34 degrees; low pressure, 10 mins, Pain and Edema  PATIENT EDUCATION:  Education details: HEP, healing, prognosis, anatomy, swelling, and expectation for sorenss Person educated: Patient and Spouse Education method: Explanation and Handouts Education comprehension: verbalized understanding  HOME EXERCISE PROGRAM: [id number]  ASSESSMENT:  CLINICAL IMPRESSION: Patient presented in clinic with greater L knee pain today as she is only taking Tylenol for pain control. Patient progressed through exercise with ROM and quad activation a primary goal. Patient reported a tightness along incision and has intermittent, burning spots. Patient  excited with improvement of  L knee AROM. Normal vasopneumatic response noted following removal of the modality.  OBJECTIVE IMPAIRMENTS: Abnormal gait, decreased activity tolerance, decreased mobility, decreased ROM, decreased strength, increased edema, and pain.   ACTIVITY LIMITATIONS: lifting, transfers, bed mobility, and locomotion level  PARTICIPATION LIMITATIONS: meal prep, cleaning, and laundry  PERSONAL FACTORS: Time since onset of injury/illness/exacerbation are also affecting patient's functional outcome.   REHAB POTENTIAL: Excellent  CLINICAL DECISION MAKING: Stable/uncomplicated  EVALUATION COMPLEXITY: Low   GOALS:  SHORT TERM GOALS: Target date: 07/04/23  Ind with an initial HEP. Goal status: MET  2.  Full active left knee extension in order to normalize gait.  Goal status: INITIAL  3.  Active left knee flexion to 90 degrees.  Goal status: INITIAL  LONG TERM GOALS: Target date: 08/01/23.  Ind with an advanced HEP.  Goal status: INITIAL  2.  Active left knee flexion to 115 degrees+ so the patient can perform functional tasks and do so with pain not > 2-3/10.  Goal status: INITIAL  3.  Increase left hip and knee strength to a solid 4+/5 to provide good stability for accomplishment of functional activities.  Goal status: INITIAL  4.  Perform a reciprocating stair gait with one railing with pain not > 2-3/10.  Goal status: INITIAL  PLAN:  PT FREQUENCY:  2-3 times a week  PT DURATION: 6 weeks  PLANNED INTERVENTIONS: Therapeutic exercises, Therapeutic activity, Neuromuscular re-education, Gait training, Patient/Family education, Self Care, Electrical stimulation, Cryotherapy, Moist heat, Vasopneumatic device, and Manual therapy  PLAN FOR NEXT SESSION: Nustep, PROM, review HEP.  VMS to left quadriceps.  TKA protocol progression.   LE elevation and vasopneumatic  Marvell Fuller, PTA 07/02/2023, 1:58 PM

## 2023-07-02 NOTE — Patient Instructions (Addendum)
Take Melatonin 3-5mg  30 min before bed time. Limit screen time 3 hours before bedtime. No caffeine after lunch.

## 2023-07-04 ENCOUNTER — Ambulatory Visit: Payer: BLUE CROSS/BLUE SHIELD

## 2023-07-04 DIAGNOSIS — G8929 Other chronic pain: Secondary | ICD-10-CM

## 2023-07-04 DIAGNOSIS — M25562 Pain in left knee: Secondary | ICD-10-CM | POA: Diagnosis not present

## 2023-07-04 DIAGNOSIS — M6281 Muscle weakness (generalized): Secondary | ICD-10-CM

## 2023-07-04 DIAGNOSIS — M25662 Stiffness of left knee, not elsewhere classified: Secondary | ICD-10-CM

## 2023-07-04 DIAGNOSIS — R600 Localized edema: Secondary | ICD-10-CM

## 2023-07-04 NOTE — Therapy (Signed)
OUTPATIENT PHYSICAL THERAPY LOWER EXTREMITY TREATMENT   Patient Name: Olivia Zamora MRN: [id number] DOB:[birth year], 52 y.o., female Today's Date: 07/04/2023  END OF SESSION:  PT End of Session - 07/04/23 1606     Visit Number 4    Number of Visits 12    Date for PT Re-Evaluation 08/01/23    Authorization Type FOTO.    PT Start Time 1603    PT Stop Time 1700    PT Time Calculation (min) 57 min    Activity Tolerance Patient tolerated treatment well    Behavior During Therapy WFL for tasks assessed/performed             Past Medical History:  Diagnosis Date   Anxiety    Failed therapy with Paxil, Lexparo, Prozac, Wellbutrin, Xanax, and Ativan   Arthritis    Asthma    Cardiac disease    Angina   Chronic back pain    Constipation 08/26/2019   Depression    Failed therapy with Paxil, Lexparo, Prozac, Wellbutrin, and Ativan   Disease of thyroid gland 12/28/2011   multiple thyroid nodules in 2013 - [place] on 03/08/14 showed heterogeneous appearance of thyroid gland without focal nodule   Eczema    Generalized seizure (HCC)    stress related   GERD (gastroesophageal reflux disease)    History of DVT (deep vein thrombosis) 1988   Hypertension    Hyperthyroidism    Hypothyroidism    IBS (irritable bowel syndrome)    Internal derangement of left knee    Lactose intolerance    Migraine    Sleep apnea    wears CPAP   Vitamin D deficiency    Past Surgical History:  Procedure Laterality Date   CESAREAN SECTION     x3   COLONOSCOPY     HEMANGIOMA EXCISION Right    shoulder   IR RADIOLOGIST EVAL & MGMT  05/04/2021   MASS EXCISION Right    lump from palm of hand   OOPHORECTOMY     OTHER SURGICAL HISTORY Right    Repair of tib-fib fracture   TOTAL ABDOMINAL HYSTERECTOMY     TOTAL KNEE ARTHROPLASTY Left 06/04/2023   Procedure: LEFT TOTAL KNEE ARTHROPLASTY;  Surgeon: Marcene Corning, MD;  Location: [facility];  Service: Orthopedics;  Laterality: Left;   TUBAL LIGATION      TUMOR EXCISION Right    Axilla - benign   VASCULAR SURGERY Right 1989   blood clot removed from neck   Patient Active Problem List   Diagnosis Date Noted   Mixed hyperlipidemia 05/09/2023   Essential hypertension 11/21/2022   Primary insomnia 11/21/2022   Controlled substance agreement signed 11/21/2022   Acquired hypothyroidism 11/21/2022   H/O: CVA (cerebrovascular accident) 08/01/2021   History of DVT (deep vein thrombosis) 08/01/2021   Symptomatic mammary hypertrophy 06/13/2021   IBS (irritable bowel syndrome) 08/26/2019   Vitamin D deficiency    Sleep apnea    Depression    Anxiety    Asthma    Disease of thyroid gland 12/28/2011   REFERRING PROVIDER: Marcene Corning MD  REFERRING DIAG: S/p left total knee replacement.  THERAPY DIAG:  Chronic pain of left knee  Stiffness of left knee, not elsewhere classified  Localized edema  Muscle weakness (generalized)  Rationale for Evaluation and Treatment: Rehabilitation  ONSET DATE: Ongoing. Surgery (06/04/23).  SUBJECTIVE:   SUBJECTIVE STATEMENT: Patient reports that her knee is stiff today. She notes that she was able to get 90 degrees of knee  flexion at her follow up with her surgeon.   PERTINENT HISTORY: Please see above.  PAIN:  Are you having pain? Yes: NPRS scale: 98/10 Pain location: Left knee. Pain description: Ache, throb, sore. Aggravating factors: "Everything." Relieving factors: 'Not much."  PRECAUTIONS: Other: No ultrasound.  WEIGHT BEARING RESTRICTIONS: No  FALLS:  Has patient fallen in last 6 months? No  LIVING ENVIRONMENT: Lives with: lives with their spouse Lives in: House/apartment Stairs: Yes.  Non-reciprocating pattern. Has following equipment at home:  Standard walker.  PLOF: Independent with basic ADLs  PATIENT GOALS: Get her life back.  Return to gym workouts which she loved.  Next MD follow up:  August 2024   OBJECTIVE:   PATIENT SURVEYS:  FOTO .  EDEMA:   Circumferential: Right 5 cms > left.  PALPATION: C/o diffuse left knee pain currently.  Incision appears to be healing well.  LOWER EXTREMITY ROM:   Active  Left eval Left 07/02/23  Hip flexion    Hip extension    Hip abduction    Hip adduction    Hip internal rotation    Hip external rotation    Knee flexion 75 85  Knee extension -15 -12  Ankle dorsiflexion    Ankle plantarflexion    Ankle inversion    Ankle eversion     (Blank rows = not tested)   LOWER EXTREMITY MMT: Patient unable to perform an active antigravity left SLR currently.  She has a decrease in volitional activation of her left quadriceps musculature.   GAIT: The patient is walking safely with a standard walker with good advancement of her left LE.  TODAY'STREATMENT:                                                                                                                              DATE:                                   07/04/23 EXERCISE LOG  Exercise Repetitions and Resistance Comments  Nustep L4 x 18 minutes; seat 7   Lunges onto step  14" step x 3 minutes   Standing gastroc stretch  2.5 minutes   Step up  6" step x 25 reps  LLE leading; required cueing to avoid circumduction   Tandem on foam  4 x 30 seconds each  Intermittent UE support  Marching on foam  2.5 minutes  BUE support  Zero degree knee 15 minutes  During vasopnuematic for knee extension   Blank cell = exercise not performed today  Manual Therapy Soft Tissue Mobilization: left quadriceps, for improved soft tissue extensibility Joint Mobilizations: patellar, grade I-IV  Passive ROM: flexion, to tolerance, max of 102 degrees   Modalities: no redness or adverse reaction to today's modalities  Date:  Vaso: Knee, 34 degrees, 15 mins, Pain and Edema   07/02/23 EXERCISE LOG  Exercise  Repetitions and Resistance Comments  Nustep  L1 x 16 minutes; seat 8-7   Slant board stretch X2 min   Lunges onto step  8" step x 3 minutes    Forward step up 6" step x20 reps   Step down 4" step x20 reps   Heel raises X20 reps   Heel prop into ext X2 min    Blank cell = exercise not performed today  Modalities: no adverse reaction to today's modalities  Date:  Vaso: Knee, 34 degrees; low pressure, 10 mins, Pain and Edema  PATIENT EDUCATION:  Education details: progress with therapy, healing, expectation for swelling and soreness, and objective measures  Person educated: Patient Education method: Explanation Education comprehension: verbalized understanding  HOME EXERCISE PROGRAM: [id number]  ASSESSMENT:  CLINICAL IMPRESSION: Patient was progressed with tandem stance and marching on foam for improved knee stability on unstable terrain for improved functional mobility. She required minimal cueing with today's interventions for proper exercise performance to promote improved knee mobility. Manual therapy focused on improved knee flexion through the use of patellar joint mobilizations, soft tissue mobilization to the quadriceps, and passive range of motion. She was initially able to achieve 102 degrees of passive knee flexion upon the conclusion of manual therapy. Knee extension was promoted through the use of the zero degree knee during today's modalities as this reduced her pain with knee extension. She reported feeling optimistic upon the conclusion of treatment that her knee was improving. She continues to require skilled physical therapy to address her remaining impairments to return to her prior level of function.   OBJECTIVE IMPAIRMENTS: Abnormal gait, decreased activity tolerance, decreased mobility, decreased ROM, decreased strength, increased edema, and pain.   ACTIVITY LIMITATIONS: lifting, transfers, bed mobility, and locomotion level  PARTICIPATION LIMITATIONS: meal prep, cleaning, and laundry  PERSONAL FACTORS: Time since onset of injury/illness/exacerbation are also affecting patient's functional outcome.   REHAB  POTENTIAL: Excellent  CLINICAL DECISION MAKING: Stable/uncomplicated  EVALUATION COMPLEXITY: Low   GOALS:  SHORT TERM GOALS: Target date: 07/04/23  Ind with an initial HEP. Goal status: MET  2.  Full active left knee extension in order to normalize gait.  Goal status: INITIAL  3.  Active left knee flexion to 90 degrees.  Goal status: INITIAL  LONG TERM GOALS: Target date: 08/01/23.  Ind with an advanced HEP.  Goal status: INITIAL  2.  Active left knee flexion to 115 degrees+ so the patient can perform functional tasks and do so with pain not > 2-3/10.  Goal status: INITIAL  3.  Increase left hip and knee strength to a solid 4+/5 to provide good stability for accomplishment of functional activities.  Goal status: INITIAL  4.  Perform a reciprocating stair gait with one railing with pain not > 2-3/10.  Goal status: INITIAL  PLAN:  PT FREQUENCY:  2-3 times a week  PT DURATION: 6 weeks  PLANNED INTERVENTIONS: Therapeutic exercises, Therapeutic activity, Neuromuscular re-education, Gait training, Patient/Family education, Self Care, Electrical stimulation, Cryotherapy, Moist heat, Vasopneumatic device, and Manual therapy  PLAN FOR NEXT SESSION: Nustep, PROM, review HEP.  VMS to left quadriceps.  TKA protocol progression.   LE elevation and vasopneumatic  Granville Lewis, PT 07/04/2023, 6:28 PM

## 2023-07-05 ENCOUNTER — Encounter: Payer: Self-pay | Admitting: Family Medicine

## 2023-07-08 ENCOUNTER — Other Ambulatory Visit: Payer: Self-pay | Admitting: Family Medicine

## 2023-07-08 MED ORDER — ALBUTEROL SULFATE HFA 108 (90 BASE) MCG/ACT IN AERS: 2.0000 | INHALATION_SPRAY | Freq: Four times a day (QID) | RESPIRATORY_TRACT | 3 refills | Status: DC | PRN

## 2023-07-08 MED ORDER — ALBUTEROL SULFATE 0.63 MG/3ML IN NEBU: 1.0000 | INHALATION_SOLUTION | Freq: Four times a day (QID) | RESPIRATORY_TRACT | 5 refills | Status: DC | PRN

## 2023-07-08 MED ORDER — ALBUTEROL SULFATE HFA 108 (90 BASE) MCG/ACT IN AERS: 2.0000 | INHALATION_SPRAY | Freq: Four times a day (QID) | RESPIRATORY_TRACT | 11 refills | Status: DC | PRN

## 2023-07-09 ENCOUNTER — Ambulatory Visit: Payer: BLUE CROSS/BLUE SHIELD | Admitting: Physical Therapy

## 2023-07-09 DIAGNOSIS — M6281 Muscle weakness (generalized): Secondary | ICD-10-CM

## 2023-07-09 DIAGNOSIS — M25662 Stiffness of left knee, not elsewhere classified: Secondary | ICD-10-CM

## 2023-07-09 DIAGNOSIS — M25562 Pain in left knee: Secondary | ICD-10-CM | POA: Diagnosis not present

## 2023-07-09 DIAGNOSIS — R600 Localized edema: Secondary | ICD-10-CM

## 2023-07-09 DIAGNOSIS — G8929 Other chronic pain: Secondary | ICD-10-CM

## 2023-07-09 NOTE — Therapy (Signed)
OUTPATIENT PHYSICAL THERAPY LOWER EXTREMITY TREATMENT   Patient Name: Mirna Defreitas MRN: [id number] DOB:[birth year], 52 y.o., female Today's Date: 07/09/2023  END OF SESSION:  PT End of Session - 07/09/23 1538     Visit Number 5    Number of Visits 12    Date for PT Re-Evaluation 08/01/23    Authorization Type FOTO.    PT Start Time 0315    PT Stop Time 0405    PT Time Calculation (min) 50 min    Activity Tolerance Patient tolerated treatment well    Behavior During Therapy WFL for tasks assessed/performed             Past Medical History:  Diagnosis Date   Anxiety    Failed therapy with Paxil, Lexparo, Prozac, Wellbutrin, Xanax, and Ativan   Arthritis    Asthma    Cardiac disease    Angina   Chronic back pain    Constipation 08/26/2019   Depression    Failed therapy with Paxil, Lexparo, Prozac, Wellbutrin, and Ativan   Disease of thyroid gland 12/28/2011   multiple thyroid nodules in 2013 - [place] on 03/08/14 showed heterogeneous appearance of thyroid gland without focal nodule   Eczema    Generalized seizure (HCC)    stress related   GERD (gastroesophageal reflux disease)    History of DVT (deep vein thrombosis) 1988   Hypertension    Hyperthyroidism    Hypothyroidism    IBS (irritable bowel syndrome)    Internal derangement of left knee    Lactose intolerance    Migraine    Sleep apnea    wears CPAP   Vitamin D deficiency    Past Surgical History:  Procedure Laterality Date   CESAREAN SECTION     x3   COLONOSCOPY     HEMANGIOMA EXCISION Right    shoulder   IR RADIOLOGIST EVAL & MGMT  05/04/2021   MASS EXCISION Right    lump from palm of hand   OOPHORECTOMY     OTHER SURGICAL HISTORY Right    Repair of tib-fib fracture   TOTAL ABDOMINAL HYSTERECTOMY     TOTAL KNEE ARTHROPLASTY Left 06/04/2023   Procedure: LEFT TOTAL KNEE ARTHROPLASTY;  Surgeon: Marcene Corning, MD;  Location: [facility];  Service: Orthopedics;  Laterality: Left;   TUBAL LIGATION      TUMOR EXCISION Right    Axilla - benign   VASCULAR SURGERY Right 1989   blood clot removed from neck   Patient Active Problem List   Diagnosis Date Noted   Mixed hyperlipidemia 05/09/2023   Essential hypertension 11/21/2022   Primary insomnia 11/21/2022   Controlled substance agreement signed 11/21/2022   Acquired hypothyroidism 11/21/2022   H/O: CVA (cerebrovascular accident) 08/01/2021   History of DVT (deep vein thrombosis) 08/01/2021   Symptomatic mammary hypertrophy 06/13/2021   IBS (irritable bowel syndrome) 08/26/2019   Vitamin D deficiency    Sleep apnea    Depression    Anxiety    Asthma    Disease of thyroid gland 12/28/2011   REFERRING PROVIDER: Marcene Corning MD  REFERRING DIAG: S/p left total knee replacement.  THERAPY DIAG:  Chronic pain of left knee  Stiffness of left knee, not elsewhere classified  Localized edema  Muscle weakness (generalized)  Rationale for Evaluation and Treatment: Rehabilitation  ONSET DATE: Ongoing. Surgery (06/04/23).  SUBJECTIVE:   SUBJECTIVE STATEMENT: Doing better today.  Pleased with progress.  PERTINENT HISTORY: Please see above.  PAIN:  Are you having pain?  Yes: NPRS scale: 7-8/10 Pain location: Left knee. Pain description: Ache, throb, sore. Aggravating factors: "Everything." Relieving factors: 'Not much."  PRECAUTIONS: Other: No ultrasound.  WEIGHT BEARING RESTRICTIONS: No  FALLS:  Has patient fallen in last 6 months? No  LIVING ENVIRONMENT: Lives with: lives with their spouse Lives in: House/apartment Stairs: Yes.  Non-reciprocating pattern. Has following equipment at home:  Standard walker.  PLOF: Independent with basic ADLs  PATIENT GOALS: Get her life back.  Return to gym workouts which she loved.  Next MD follow up:  August 2024   OBJECTIVE:   PATIENT SURVEYS:  FOTO .  EDEMA:  Circumferential: Right 5 cms > left.  PALPATION: C/o diffuse left knee pain currently.  Incision appears to  be healing well.  LOWER EXTREMITY ROM:   Active  Left eval Left 07/02/23  Hip flexion    Hip extension    Hip abduction    Hip adduction    Hip internal rotation    Hip external rotation    Knee flexion 75 85  Knee extension -15 -12  Ankle dorsiflexion    Ankle plantarflexion    Ankle inversion    Ankle eversion     (Blank rows = not tested)   LOWER EXTREMITY MMT: Patient unable to perform an active antigravity left SLR currently.  She has a decrease in volitional activation of her left quadriceps musculature.   GAIT: The patient is walking safely with a standard walker with good advancement of her left LE.  TODAY'STREATMENT:                                                                                                                              DATE:                                   07/09/23 EXERCISE LOG  Exercise Repetitions and Resistance Comments  Nustep L4 x 15 minutes moving seat forward x 2 to increase flexion.   Knee ext 10# x 3 minutes   Ham curls 30# x 3 minutes                   In supine:  PROM into left knee flexion and extension x 10 minutes f/b LE elevation and vasopneumatic x 15 minutes.   PATIENT EDUCATION:  Education details: progress with therapy, healing, expectation for swelling and soreness, and objective measures  Person educated: Patient Education method: Explanation Education comprehension: verbalized understanding  HOME EXERCISE PROGRAM: [id number]  ASSESSMENT:  CLINICAL IMPRESSION: Patient did a great job today with the addition of weight machines for quads and hams.  Following PROM she achieved passive left knee flexion to 110 degrees.  OBJECTIVE IMPAIRMENTS: Abnormal gait, decreased activity tolerance, decreased mobility, decreased ROM, decreased strength, increased edema, and pain.   ACTIVITY LIMITATIONS: lifting, transfers, bed mobility, and locomotion level  PARTICIPATION LIMITATIONS: meal prep,  cleaning, and  laundry  PERSONAL FACTORS: Time since onset of injury/illness/exacerbation are also affecting patient's functional outcome.   REHAB POTENTIAL: Excellent  CLINICAL DECISION MAKING: Stable/uncomplicated  EVALUATION COMPLEXITY: Low   GOALS:  SHORT TERM GOALS: Target date: 07/04/23  Ind with an initial HEP. Goal status: MET  2.  Full active left knee extension in order to normalize gait.  Goal status: INITIAL  3.  Active left knee flexion to 90 degrees.  Goal status: INITIAL  LONG TERM GOALS: Target date: 08/01/23.  Ind with an advanced HEP.  Goal status: INITIAL  2.  Active left knee flexion to 115 degrees+ so the patient can perform functional tasks and do so with pain not > 2-3/10.  Goal status: INITIAL  3.  Increase left hip and knee strength to a solid 4+/5 to provide good stability for accomplishment of functional activities.  Goal status: INITIAL  4.  Perform a reciprocating stair gait with one railing with pain not > 2-3/10.  Goal status: INITIAL  PLAN:  PT FREQUENCY:  2-3 times a week  PT DURATION: 6 weeks  PLANNED INTERVENTIONS: Therapeutic exercises, Therapeutic activity, Neuromuscular re-education, Gait training, Patient/Family education, Self Care, Electrical stimulation, Cryotherapy, Moist heat, Vasopneumatic device, and Manual therapy  PLAN FOR NEXT SESSION: Nustep, PROM, review HEP.  VMS to left quadriceps.  TKA protocol progression.   LE elevation and vasopneumatic  Chanese Hartsough, [place], PT 07/09/2023, 5:45 PM

## 2023-07-11 ENCOUNTER — Ambulatory Visit: Payer: BLUE CROSS/BLUE SHIELD | Attending: Orthopaedic Surgery | Admitting: Physical Therapy

## 2023-07-11 ENCOUNTER — Encounter: Payer: Self-pay | Admitting: Physical Therapy

## 2023-07-11 DIAGNOSIS — M25662 Stiffness of left knee, not elsewhere classified: Secondary | ICD-10-CM | POA: Insufficient documentation

## 2023-07-11 DIAGNOSIS — M6281 Muscle weakness (generalized): Secondary | ICD-10-CM | POA: Insufficient documentation

## 2023-07-11 DIAGNOSIS — M25562 Pain in left knee: Secondary | ICD-10-CM | POA: Diagnosis present

## 2023-07-11 DIAGNOSIS — R600 Localized edema: Secondary | ICD-10-CM | POA: Insufficient documentation

## 2023-07-11 DIAGNOSIS — G8929 Other chronic pain: Secondary | ICD-10-CM | POA: Diagnosis present

## 2023-07-11 NOTE — Therapy (Signed)
OUTPATIENT PHYSICAL THERAPY LOWER EXTREMITY TREATMENT   Patient Name: Makailyn Fedele MRN: [id number] DOB:1971-03-06, 52 y.o., female Today's Date: 07/11/2023  END OF SESSION:  PT End of Session - 07/11/23 1312     Visit Number 6    Number of Visits 12    Date for PT Re-Evaluation 08/01/23    Authorization Type FOTO.    PT Start Time 0100    PT Stop Time 0149    PT Time Calculation (min) 49 min    Activity Tolerance Patient tolerated treatment well    Behavior During Therapy WFL for tasks assessed/performed             Past Medical History:  Diagnosis Date   Anxiety    Failed therapy with Paxil, Lexparo, Prozac, Wellbutrin, Xanax, and Ativan   Arthritis    Asthma    Cardiac disease    Angina   Chronic back pain    Constipation 08/26/2019   Depression    Failed therapy with Paxil, Lexparo, Prozac, Wellbutrin, and Ativan   Disease of thyroid gland 12/28/2011   multiple thyroid nodules in 2013 - [place] on 03/08/14 showed heterogeneous appearance of thyroid gland without focal nodule   Eczema    Generalized seizure (HCC)    stress related   GERD (gastroesophageal reflux disease)    History of DVT (deep vein thrombosis) 1988   Hypertension    Hyperthyroidism    Hypothyroidism    IBS (irritable bowel syndrome)    Internal derangement of left knee    Lactose intolerance    Migraine    Sleep apnea    wears CPAP   Vitamin D deficiency    Past Surgical History:  Procedure Laterality Date   CESAREAN SECTION     x3   COLONOSCOPY     HEMANGIOMA EXCISION Right    shoulder   IR RADIOLOGIST EVAL & MGMT  05/04/2021   MASS EXCISION Right    lump from palm of hand   OOPHORECTOMY     OTHER SURGICAL HISTORY Right    Repair of tib-fib fracture   TOTAL ABDOMINAL HYSTERECTOMY     TOTAL KNEE ARTHROPLASTY Left 06/04/2023   Procedure: LEFT TOTAL KNEE ARTHROPLASTY;  Surgeon: Marcene Corning, MD;  Location: [facility];  Service: Orthopedics;  Laterality: Left;   TUBAL LIGATION     TUMOR  EXCISION Right    Axilla - benign   VASCULAR SURGERY Right 1989   blood clot removed from neck   Patient Active Problem List   Diagnosis Date Noted   Mixed hyperlipidemia 05/09/2023   Essential hypertension 11/21/2022   Primary insomnia 11/21/2022   Controlled substance agreement signed 11/21/2022   Acquired hypothyroidism 11/21/2022   H/O: CVA (cerebrovascular accident) 08/01/2021   History of DVT (deep vein thrombosis) 08/01/2021   Symptomatic mammary hypertrophy 06/13/2021   IBS (irritable bowel syndrome) 08/26/2019   Vitamin D deficiency    Sleep apnea    Depression    Anxiety    Asthma    Disease of thyroid gland 12/28/2011   REFERRING PROVIDER: Marcene Corning MD  REFERRING DIAG: S/p left total knee replacement.  THERAPY DIAG:  Chronic pain of left knee  Stiffness of left knee, not elsewhere classified  Localized edema  Muscle weakness (generalized)  Rationale for Evaluation and Treatment: Rehabilitation  ONSET DATE: Ongoing. Surgery (06/04/23).  SUBJECTIVE:   SUBJECTIVE STATEMENT: Doing better today.  Pleased with progress.  Pain about a 4.    PERTINENT HISTORY: Please see above.  PAIN:  Are you having pain? Yes: NPRS scale: 4/10 Pain location: Left knee. Pain description: Ache, throb, sore. Aggravating factors: "Everything." Relieving factors: 'Not much."  PRECAUTIONS: Other: No ultrasound.  WEIGHT BEARING RESTRICTIONS: No  FALLS:  Has patient fallen in last 6 months? No  LIVING ENVIRONMENT: Lives with: lives with their spouse Lives in: House/apartment Stairs: Yes.  Non-reciprocating pattern. Has following equipment at home:  Standard walker.  PLOF: Independent with basic ADLs  PATIENT GOALS: Get her life back.  Return to gym workouts which she loved.  Next MD follow up:  August 2024   OBJECTIVE:   PATIENT SURVEYS:  FOTO .  EDEMA:  Circumferential: Right 5 cms > left.  PALPATION: C/o diffuse left knee pain currently.  Incision  appears to be healing well.  LOWER EXTREMITY ROM:   Active  Left eval Left 07/02/23  Hip flexion    Hip extension    Hip abduction    Hip adduction    Hip internal rotation    Hip external rotation    Knee flexion 75 85  Knee extension -15 -12  Ankle dorsiflexion    Ankle plantarflexion    Ankle inversion    Ankle eversion     (Blank rows = not tested)   LOWER EXTREMITY MMT: Patient unable to perform an active antigravity left SLR currently.  She has a decrease in volitional activation of her left quadriceps musculature.   GAIT: The patient is walking safely with a standard walker with good advancement of her left LE.  TODAY'STREATMENT:                                                                                                                              DATE:                                   07/09/23 EXERCISE LOG  Exercise Repetitions and Resistance Comments  Nustep L4 x 5 minutes moving seat forward.    Recumbent bike 10 minutes seat 6.   Knee ext 10# x 3 minutes   Ham curls 30# x 3 minutes                   In supine:  PROM into left knee flexion and extension x 10 minutes f/b LE elevation and vasopneumatic x 15 minutes.   PATIENT EDUCATION:  Education details: progress with therapy, healing, expectation for swelling and soreness, and objective measures  Person educated: Patient Education method: Explanation Education comprehension: verbalized understanding  HOME EXERCISE PROGRAM: [id number]  ASSESSMENT:  CLINICAL IMPRESSION: Patient able to perform recumbent bike, first, with backward revolutions and then forward revolutions.    OBJECTIVE IMPAIRMENTS: Abnormal gait, decreased activity tolerance, decreased mobility, decreased ROM, decreased strength, increased edema, and pain.   ACTIVITY LIMITATIONS: lifting, transfers, bed mobility, and locomotion level  PARTICIPATION LIMITATIONS: meal prep, cleaning, and  laundry  PERSONAL FACTORS: Time since  onset of injury/illness/exacerbation are also affecting patient's functional outcome.   REHAB POTENTIAL: Excellent  CLINICAL DECISION MAKING: Stable/uncomplicated  EVALUATION COMPLEXITY: Low   GOALS:  SHORT TERM GOALS: Target date: 07/04/23  Ind with an initial HEP. Goal status: MET  2.  Full active left knee extension in order to normalize gait.  Goal status: INITIAL  3.  Active left knee flexion to 90 degrees.  Goal status: INITIAL  LONG TERM GOALS: Target date: 08/01/23.  Ind with an advanced HEP.  Goal status: INITIAL  2.  Active left knee flexion to 115 degrees+ so the patient can perform functional tasks and do so with pain not > 2-3/10.  Goal status: INITIAL  3.  Increase left hip and knee strength to a solid 4+/5 to provide good stability for accomplishment of functional activities.  Goal status: INITIAL  4.  Perform a reciprocating stair gait with one railing with pain not > 2-3/10.  Goal status: INITIAL  PLAN:  PT FREQUENCY:  2-3 times a week  PT DURATION: 6 weeks  PLANNED INTERVENTIONS: Therapeutic exercises, Therapeutic activity, Neuromuscular re-education, Gait training, Patient/Family education, Self Care, Electrical stimulation, Cryotherapy, Moist heat, Vasopneumatic device, and Manual therapy  PLAN FOR NEXT SESSION: Nustep, PROM, review HEP.  VMS to left quadriceps.  TKA protocol progression.   LE elevation and vasopneumatic  Ayo Guarino, [place], PT 07/11/2023, 2:06 PM

## 2023-07-16 ENCOUNTER — Ambulatory Visit: Payer: BLUE CROSS/BLUE SHIELD | Admitting: Physical Therapy

## 2023-07-16 ENCOUNTER — Encounter: Payer: Self-pay | Admitting: Physical Therapy

## 2023-07-16 ENCOUNTER — Ambulatory Visit: Payer: BLUE CROSS/BLUE SHIELD | Admitting: Family Medicine

## 2023-07-16 DIAGNOSIS — M25562 Pain in left knee: Secondary | ICD-10-CM | POA: Diagnosis not present

## 2023-07-16 DIAGNOSIS — R600 Localized edema: Secondary | ICD-10-CM

## 2023-07-16 DIAGNOSIS — M6281 Muscle weakness (generalized): Secondary | ICD-10-CM

## 2023-07-16 DIAGNOSIS — M25662 Stiffness of left knee, not elsewhere classified: Secondary | ICD-10-CM

## 2023-07-16 DIAGNOSIS — G8929 Other chronic pain: Secondary | ICD-10-CM

## 2023-07-16 NOTE — Therapy (Signed)
OUTPATIENT PHYSICAL THERAPY LOWER EXTREMITY TREATMENT   Patient Name: Olivia Zamora MRN: [id number] DOB:July 11, 1971, 52 y.o., female Today's Date: 07/16/2023  END OF SESSION:  PT End of Session - 07/16/23 1300     Visit Number 7    Number of Visits 12    Date for PT Re-Evaluation 08/01/23    Authorization Type FOTO.    PT Start Time 1303    Activity Tolerance Patient tolerated treatment well    Behavior During Therapy WFL for tasks assessed/performed            Past Medical History:  Diagnosis Date   Anxiety    Failed therapy with Paxil, Lexparo, Prozac, Wellbutrin, Xanax, and Ativan   Arthritis    Asthma    Cardiac disease    Angina   Chronic back pain    Constipation 08/26/2019   Depression    Failed therapy with Paxil, Lexparo, Prozac, Wellbutrin, and Ativan   Disease of thyroid gland 12/28/2011   multiple thyroid nodules in 2013 - [place] on 03/08/14 showed heterogeneous appearance of thyroid gland without focal nodule   Eczema    Generalized seizure (HCC)    stress related   GERD (gastroesophageal reflux disease)    History of DVT (deep vein thrombosis) 1988   Hypertension    Hyperthyroidism    Hypothyroidism    IBS (irritable bowel syndrome)    Internal derangement of left knee    Lactose intolerance    Migraine    Sleep apnea    wears CPAP   Vitamin D deficiency    Past Surgical History:  Procedure Laterality Date   CESAREAN SECTION     x3   COLONOSCOPY     HEMANGIOMA EXCISION Right    shoulder   IR RADIOLOGIST EVAL & MGMT  05/04/2021   MASS EXCISION Right    lump from palm of hand   OOPHORECTOMY     OTHER SURGICAL HISTORY Right    Repair of tib-fib fracture   TOTAL ABDOMINAL HYSTERECTOMY     TOTAL KNEE ARTHROPLASTY Left 06/04/2023   Procedure: LEFT TOTAL KNEE ARTHROPLASTY;  Surgeon: Marcene Corning, MD;  Location: [facility];  Service: Orthopedics;  Laterality: Left;   TUBAL LIGATION     TUMOR EXCISION Right    Axilla - benign   VASCULAR SURGERY Right  1989   blood clot removed from neck   Patient Active Problem List   Diagnosis Date Noted   Mixed hyperlipidemia 05/09/2023   Essential hypertension 11/21/2022   Primary insomnia 11/21/2022   Controlled substance agreement signed 11/21/2022   Acquired hypothyroidism 11/21/2022   H/O: CVA (cerebrovascular accident) 08/01/2021   History of DVT (deep vein thrombosis) 08/01/2021   Symptomatic mammary hypertrophy 06/13/2021   IBS (irritable bowel syndrome) 08/26/2019   Vitamin D deficiency    Sleep apnea    Depression    Anxiety    Asthma    Disease of thyroid gland 12/28/2011   REFERRING PROVIDER: Marcene Corning MD  REFERRING DIAG: S/p left total knee replacement.  THERAPY DIAG:  Chronic pain of left knee  Stiffness of left knee, not elsewhere classified  Localized edema  Muscle weakness (generalized)  Rationale for Evaluation and Treatment: Rehabilitation  ONSET DATE: Ongoing. Surgery (06/04/23).  SUBJECTIVE:   SUBJECTIVE STATEMENT: Has been doing her stationary bike at home but maybe too much. Was trying to do bike at least once an hour and then downgraded to two to three times a day.  PERTINENT HISTORY: Please see  above.  PAIN:  Are you having pain? Yes: NPRS scale: 5-6/10 Pain location: Left knee. Pain description: Ache, throb, sore. Aggravating factors: "Everything." Relieving factors: 'Not much."  PRECAUTIONS: Other: No ultrasound.  WEIGHT BEARING RESTRICTIONS: No  FALLS:  Has patient fallen in last 6 months? No  LIVING ENVIRONMENT: Lives with: lives with their spouse Lives in: House/apartment Stairs: Yes.  Non-reciprocating pattern. Has following equipment at home:  Standard walker.  PLOF: Independent with basic ADLs  PATIENT GOALS: Get her life back.  Return to gym workouts which she loved.  Next MD follow up:  August 2024   OBJECTIVE:   PATIENT SURVEYS:  FOTO 49  EDEMA:  Circumferential: Right 5 cms > left.  PALPATION: C/o diffuse  left knee pain currently.  Incision appears to be healing well.  LOWER EXTREMITY ROM:   Active  Left eval Left 07/02/23  Hip flexion    Hip extension    Hip abduction    Hip adduction    Hip internal rotation    Hip external rotation    Knee flexion 75 85  Knee extension -15 -12  Ankle dorsiflexion    Ankle plantarflexion    Ankle inversion    Ankle eversion     (Blank rows = not tested)   LOWER EXTREMITY MMT: Patient unable to perform an active antigravity left SLR currently.  She has a decrease in volitional activation of her left quadriceps musculature.   GAIT: Soreness reported with knee extension locked during swing phase.  TODAY'STREATMENT:                                                                                                                              DATE: 07/16/23 EXERCISE LOG  Exercise Repetitions and Resistance Comments  Nustep L3 x 16 minutes, seat 7   Recumbent bike 10 minutes seat 7   Knee ext 10# x 3 minutes   Ham curls 30# x 3 minutes   Leg press  1 pl, seat 6 x20 reps                Modalities  Date: 07/16/23 Vaso: Knee, Low, 10 mins, Pain  PATIENT EDUCATION:  Education details: progress with therapy, healing, expectation for swelling and soreness, and objective measures  Person educated: Patient Education method: Explanation Education comprehension: verbalized understanding  HOME EXERCISE PROGRAM: [id number]  ASSESSMENT:  CLINICAL IMPRESSION: Patient presented in clinic ambulating with L knee extension even during the swing phase of gait. Patient had no AD and was VC to increase knee flexion during gait. Patient progressed to bike and machine strengthening with supervision with weights and tolerance. No complaints while on machine strengthening. Normal vasopneumatic response noted following removal of the modality. Patient educated to complete bike possibly 2-4 times a day based on how her body tolerates and her activity level for the day  along with other exercises she does throughout the day.  OBJECTIVE IMPAIRMENTS: Abnormal gait, decreased activity  tolerance, decreased mobility, decreased ROM, decreased strength, increased edema, and pain.   ACTIVITY LIMITATIONS: lifting, transfers, bed mobility, and locomotion level  PARTICIPATION LIMITATIONS: meal prep, cleaning, and laundry  PERSONAL FACTORS: Time since onset of injury/illness/exacerbation are also affecting patient's functional outcome.   REHAB POTENTIAL: Excellent  CLINICAL DECISION MAKING: Stable/uncomplicated  EVALUATION COMPLEXITY: Low  GOALS:  SHORT TERM GOALS: Target date: 07/04/23  Ind with an initial HEP. Goal status: MET  2.  Full active left knee extension in order to normalize gait.  Goal status: On-going  3.  Active left knee flexion to 90 degrees.  Goal status: On-going  LONG TERM GOALS: Target date: 08/01/23.  Ind with an advanced HEP.  Goal status: On-going  2.  Active left knee flexion to 115 degrees+ so the patient can perform functional tasks and do so with pain not > 2-3/10.  Goal status: On-going  3.  Increase left hip and knee strength to a solid 4+/5 to provide good stability for accomplishment of functional activities.  Goal status: On-going  4.  Perform a reciprocating stair gait with one railing with pain not > 2-3/10.  Goal status: On-going  PLAN:  PT FREQUENCY:  2-3 times a week  PT DURATION: 6 weeks  PLANNED INTERVENTIONS: Therapeutic exercises, Therapeutic activity, Neuromuscular re-education, Gait training, Patient/Family education, Self Care, Electrical stimulation, Cryotherapy, Moist heat, Vasopneumatic device, and Manual therapy  PLAN FOR NEXT SESSION: Nustep, PROM, review HEP.  VMS to left quadriceps.  TKA protocol progression.    Marvell Fuller, PTA 07/16/2023, 1:54 PM

## 2023-07-18 ENCOUNTER — Encounter: Payer: BLUE CROSS/BLUE SHIELD | Admitting: Physical Therapy

## 2023-07-22 ENCOUNTER — Ambulatory Visit: Payer: BLUE CROSS/BLUE SHIELD

## 2023-07-22 DIAGNOSIS — G8929 Other chronic pain: Secondary | ICD-10-CM

## 2023-07-22 DIAGNOSIS — M6281 Muscle weakness (generalized): Secondary | ICD-10-CM

## 2023-07-22 DIAGNOSIS — R600 Localized edema: Secondary | ICD-10-CM

## 2023-07-22 DIAGNOSIS — M25662 Stiffness of left knee, not elsewhere classified: Secondary | ICD-10-CM

## 2023-07-22 DIAGNOSIS — M25562 Pain in left knee: Secondary | ICD-10-CM | POA: Diagnosis not present

## 2023-07-22 NOTE — Therapy (Signed)
OUTPATIENT PHYSICAL THERAPY LOWER EXTREMITY TREATMENT   Patient Name: Olivia Zamora MRN: [id number] DOB:Feb 05, 1971, 52 y.o., female Today's Date: 07/22/2023  END OF SESSION:  PT End of Session - 07/22/23 1447     Visit Number 8    Number of Visits 12    Date for PT Re-Evaluation 08/01/23    Authorization Type FOTO.    PT Start Time 1434    PT Stop Time 1520    PT Time Calculation (min) 46 min    Activity Tolerance Patient tolerated treatment well    Behavior During Therapy WFL for tasks assessed/performed             Past Medical History:  Diagnosis Date   Anxiety    Failed therapy with Paxil, Lexparo, Prozac, Wellbutrin, Xanax, and Ativan   Arthritis    Asthma    Cardiac disease    Angina   Chronic back pain    Constipation 08/26/2019   Depression    Failed therapy with Paxil, Lexparo, Prozac, Wellbutrin, and Ativan   Disease of thyroid gland 12/28/2011   multiple thyroid nodules in 2013 - [place] on 03/08/14 showed heterogeneous appearance of thyroid gland without focal nodule   Eczema    Generalized seizure (HCC)    stress related   GERD (gastroesophageal reflux disease)    History of DVT (deep vein thrombosis) 1988   Hypertension    Hyperthyroidism    Hypothyroidism    IBS (irritable bowel syndrome)    Internal derangement of left knee    Lactose intolerance    Migraine    Sleep apnea    wears CPAP   Vitamin D deficiency    Past Surgical History:  Procedure Laterality Date   CESAREAN SECTION     x3   COLONOSCOPY     HEMANGIOMA EXCISION Right    shoulder   IR RADIOLOGIST EVAL & MGMT  05/04/2021   MASS EXCISION Right    lump from palm of hand   OOPHORECTOMY     OTHER SURGICAL HISTORY Right    Repair of tib-fib fracture   TOTAL ABDOMINAL HYSTERECTOMY     TOTAL KNEE ARTHROPLASTY Left 06/04/2023   Procedure: LEFT TOTAL KNEE ARTHROPLASTY;  Surgeon: Marcene Corning, MD;  Location: [facility];  Service: Orthopedics;  Laterality: Left;   TUBAL LIGATION      TUMOR EXCISION Right    Axilla - benign   VASCULAR SURGERY Right 1989   blood clot removed from neck   Patient Active Problem List   Diagnosis Date Noted   Mixed hyperlipidemia 05/09/2023   Essential hypertension 11/21/2022   Primary insomnia 11/21/2022   Controlled substance agreement signed 11/21/2022   Acquired hypothyroidism 11/21/2022   H/O: CVA (cerebrovascular accident) 08/01/2021   History of DVT (deep vein thrombosis) 08/01/2021   Symptomatic mammary hypertrophy 06/13/2021   IBS (irritable bowel syndrome) 08/26/2019   Vitamin D deficiency    Sleep apnea    Depression    Anxiety    Asthma    Disease of thyroid gland 12/28/2011   REFERRING PROVIDER: Marcene Corning MD  REFERRING DIAG: S/p left total knee replacement.  THERAPY DIAG:  Chronic pain of left knee  Stiffness of left knee, not elsewhere classified  Localized edema  Muscle weakness (generalized)  Rationale for Evaluation and Treatment: Rehabilitation  ONSET DATE: Ongoing. Surgery (06/04/23).  SUBJECTIVE:   SUBJECTIVE STATEMENT: Patient reports that her knee is hurting more this weekend and today. However, she notes that she has been doing  her HEP for hours at a time, but then her leg will get really swollen and make it hard to stand and walk.   PERTINENT HISTORY: Please see above.  PAIN:  Are you having pain? Yes: NPRS scale: 7/10 Pain location: Left knee. Pain description: Ache, throb, sore. Aggravating factors: "Everything." Relieving factors: 'Not much."  PRECAUTIONS: Other: No ultrasound.  WEIGHT BEARING RESTRICTIONS: No  FALLS:  Has patient fallen in last 6 months? No  LIVING ENVIRONMENT: Lives with: lives with their spouse Lives in: House/apartment Stairs: Yes.  Non-reciprocating pattern. Has following equipment at home:  Standard walker.  PLOF: Independent with basic ADLs  PATIENT GOALS: Get her life back.  Return to gym workouts which she loved.  Next MD follow up:  August  2024   OBJECTIVE:   PATIENT SURVEYS:  FOTO 49  EDEMA:  Circumferential: Right 5 cms > left.  PALPATION: C/o diffuse left knee pain currently.  Incision appears to be healing well.  LOWER EXTREMITY ROM:   Active  Left eval Left 07/02/23  Hip flexion    Hip extension    Hip abduction    Hip adduction    Hip internal rotation    Hip external rotation    Knee flexion 75 85  Knee extension -15 -12  Ankle dorsiflexion    Ankle plantarflexion    Ankle inversion    Ankle eversion     (Blank rows = not tested)   LOWER EXTREMITY MMT: Patient unable to perform an active antigravity left SLR currently.  She has a decrease in volitional activation of her left quadriceps musculature.   GAIT: Soreness reported with knee extension locked during swing phase.  TODAY'STREATMENT:                                                                                                                              DATE:                                   07/22/23 EXERCISE LOG  Exercise Repetitions and Resistance Comments  Recumbent bike  L3 x 18 minutes; seat 4   Stepping strategy   For proper gait mechanics  Gait training     Blank cell = exercise not performed today  Modalities: no adverse reaction to today's modalities  Date:  Vaso: Knee, 34 degrees; low pressure, 10 mins, Pain and Edema   07/16/23 EXERCISE LOG  Exercise Repetitions and Resistance Comments  Nustep L3 x 16 minutes, seat 7   Recumbent bike 10 minutes seat 7   Knee ext 10# x 3 minutes   Ham curls 30# x 3 minutes   Leg press  1 pl, seat 6 x20 reps                Modalities  Date: 07/16/23 Vaso: Knee, Low, 10 mins, Pain  PATIENT EDUCATION:  Education details:  healing, benefits of exercise, overuse, HEP, and walking Person educated: Patient Education method: Explanation Education comprehension: verbalized understanding  HOME EXERCISE PROGRAM: [id number]  ASSESSMENT:  CLINICAL IMPRESSION: Treatment focused on  improved improved gait mechanics needed to promote improved function with her daily activities. She required moderate multimodal cueing proper gait mechanics during the stepping strategy intervention. She experienced increased difficulty achieving heel strike and toe off initially, but with multimodal cueing was able to improve her independence with this movement pattern. She experienced difficulty initially with carryover to ambulation, but after cueing for a slight increase in gait speed, she was able to demonstrate improved gait mechanics. She was educated on the benefits of exercise, but also how it can be detrimental to her healing when done in excess. She reported feeling a lot better upon the conclusion of treatment. She continues to require skilled physical therapy to address her remaining impairments to return to her prior level of function.   OBJECTIVE IMPAIRMENTS: Abnormal gait, decreased activity tolerance, decreased mobility, decreased ROM, decreased strength, increased edema, and pain.   ACTIVITY LIMITATIONS: lifting, transfers, bed mobility, and locomotion level  PARTICIPATION LIMITATIONS: meal prep, cleaning, and laundry  PERSONAL FACTORS: Time since onset of injury/illness/exacerbation are also affecting patient's functional outcome.   REHAB POTENTIAL: Excellent  CLINICAL DECISION MAKING: Stable/uncomplicated  EVALUATION COMPLEXITY: Low  GOALS:  SHORT TERM GOALS: Target date: 07/04/23  Ind with an initial HEP. Goal status: MET  2.  Full active left knee extension in order to normalize gait.  Goal status: On-going  3.  Active left knee flexion to 90 degrees.  Goal status: On-going  LONG TERM GOALS: Target date: 08/01/23.  Ind with an advanced HEP.  Goal status: On-going  2.  Active left knee flexion to 115 degrees+ so the patient can perform functional tasks and do so with pain not > 2-3/10.  Goal status: On-going  3.  Increase left hip and knee strength to a solid  4+/5 to provide good stability for accomplishment of functional activities.  Goal status: On-going  4.  Perform a reciprocating stair gait with one railing with pain not > 2-3/10.  Goal status: On-going  PLAN:  PT FREQUENCY:  2-3 times a week  PT DURATION: 6 weeks  PLANNED INTERVENTIONS: Therapeutic exercises, Therapeutic activity, Neuromuscular re-education, Gait training, Patient/Family education, Self Care, Electrical stimulation, Cryotherapy, Moist heat, Vasopneumatic device, and Manual therapy  PLAN FOR NEXT SESSION: Nustep, PROM, review HEP.  VMS to left quadriceps.  TKA protocol progression.    Granville Lewis, PT 07/22/2023, 3:33 PM

## 2023-07-23 ENCOUNTER — Ambulatory Visit: Payer: BLUE CROSS/BLUE SHIELD | Admitting: Physical Therapy

## 2023-07-23 ENCOUNTER — Encounter: Payer: Self-pay | Admitting: Neurology

## 2023-07-23 ENCOUNTER — Ambulatory Visit (INDEPENDENT_AMBULATORY_CARE_PROVIDER_SITE_OTHER): Payer: BLUE CROSS/BLUE SHIELD | Admitting: Neurology

## 2023-07-23 VITALS — BP 126/87 | HR 93 | Ht 66.0 in | Wt 180.0 lb

## 2023-07-23 DIAGNOSIS — R0681 Apnea, not elsewhere classified: Secondary | ICD-10-CM

## 2023-07-23 DIAGNOSIS — E663 Overweight: Secondary | ICD-10-CM | POA: Diagnosis not present

## 2023-07-23 DIAGNOSIS — Z820 Family history of epilepsy and other diseases of the nervous system: Secondary | ICD-10-CM

## 2023-07-23 DIAGNOSIS — R634 Abnormal weight loss: Secondary | ICD-10-CM

## 2023-07-23 DIAGNOSIS — G4700 Insomnia, unspecified: Secondary | ICD-10-CM

## 2023-07-23 DIAGNOSIS — G4719 Other hypersomnia: Secondary | ICD-10-CM

## 2023-07-23 DIAGNOSIS — G4733 Obstructive sleep apnea (adult) (pediatric): Secondary | ICD-10-CM

## 2023-07-23 DIAGNOSIS — R519 Headache, unspecified: Secondary | ICD-10-CM

## 2023-07-23 NOTE — Patient Instructions (Signed)

## 2023-07-23 NOTE — Progress Notes (Signed)
Subjective:    [name] is a 52 y.o. female.  HPI    History:   Dear Olivia Zamora,  I saw your patient, Olivia Zamora, upon your kind request in my sleep clinic today for initial consultation of her sleep disorder, in particular, concern for underlying obstructive sleep apnea.  The patient is accompanied by her husband, Olivia Zamora, today.  As you know, Olivia Zamora is a 52 year old female with an underlying medical history of hypertension, thyroid disease, irritable bowel syndrome, vitamin D deficiency, chronic back pain, depression, anxiety, arthritis, asthma, eczema, reflux disease, history of DVT, migraine headaches (followed by my colleague Dr. Marjory Zamora), and overweight state, who was previously diagnosed with obstructive sleep apnea and placed on PAP therapy.  She reports snoring and apneas as well as difficulty falling asleep and staying asleep.  She no longer uses her PAP machine as it was recalled.  She has not used her machine in over 2 years.  Her Epworth sleepiness score is 12 out of 24, fatigue severity score is 59 out of 63.  She wakes up with a headache frequently, not always a migraine, sometimes a pressure-like headache.  In general, her migraines have improved since she has been on Ajovy injections.  She has chronic difficulty initiating and maintaining sleep.  She tried Restoril in the past and then Ambien, most recently she has been on trazodone 50 mg strength, she was advised to increase it to 100 mg strength but has not done so yet, she does combine it with melatonin 15 mg about 2 hours before bedtime.  She currently does not work.  She lives with her husband and 2 adult children, ages 22 and 57, they have several pets in the house including dogs and cats.  Her mom has sleep apnea and she suspects that her daughter may have it as well.  Her bedtime is generally around 8 PM, but she has trouble falling asleep.  She is a restless sleeper.  Rise time varies, can a.m. or much later  such as noon.  Her husband works nights.  She had sleep testing many years ago and was originally diagnosed with sleep apnea several years ago.  She has had in lab testing as well as home sleep testing, she used to see a pulmonologist for sleep, they moved from up [place] about 2 years ago.  Prior sleep study results are not available for my review today.  She believes her latest home sleep test showed moderate sleep apnea.  She used her current machine last night for a total of 6 hours and 47 minutes.  She has a Social research officer, government with a pressure range of 5 to 15 cm.  She does not drink caffeine daily, she does not currently drink any alcohol.  She is a non-smoker.  Over the course of years she has lost quite a bit of weight, approximately 100 pounds altogether.  When she has not started using a PAP machine she felt greatly improved in terms of her sleep quality and daytime energy.  She would like to get back on a PAP machine.  I reviewed your office note from 07/02/2023.   Her Past Medical History Is Significant For: Past Medical History:  Diagnosis Date   Anxiety    Failed therapy with Paxil, Lexparo, Prozac, Wellbutrin, Xanax, and Ativan   Arthritis    Asthma    Cardiac disease    Angina   Chronic back pain    Constipation 08/26/2019   Depression  Failed therapy with Paxil, Lexparo, Prozac, Wellbutrin, and Ativan   Disease of thyroid gland 12/28/2011   multiple thyroid nodules in 2013 - [place] on 03/08/14 showed heterogeneous appearance of thyroid gland without focal nodule   Eczema    Generalized seizure (HCC)    stress related   GERD (gastroesophageal reflux disease)    History of DVT (deep vein thrombosis) 1988   Hypertension    Hyperthyroidism    Hypothyroidism    IBS (irritable bowel syndrome)    Internal derangement of left knee    Lactose intolerance    Migraine    Sleep apnea    wears CPAP   Vitamin D deficiency     Her Past Surgical History Is  Significant For: Past Surgical History:  Procedure Laterality Date   CESAREAN SECTION     x3   COLONOSCOPY     HEMANGIOMA EXCISION Right    shoulder   IR RADIOLOGIST EVAL & MGMT  05/04/2021   MASS EXCISION Right    lump from palm of hand   OOPHORECTOMY     OTHER SURGICAL HISTORY Right    Repair of tib-fib fracture   TOTAL ABDOMINAL HYSTERECTOMY     TOTAL KNEE ARTHROPLASTY Left 06/04/2023   Procedure: LEFT TOTAL KNEE ARTHROPLASTY;  Surgeon: Marcene Corning, MD;  Location: [facility];  Service: Orthopedics;  Laterality: Left;   TUBAL LIGATION     TUMOR EXCISION Right    Axilla - benign   VASCULAR SURGERY Right 1989   blood clot removed from neck    Her Family History Is Significant For: Family History  Problem Relation Age of Onset   Other Niece        antiphospholipid AB syndrome   Factor V Leiden deficiency Sister    CVA Sister        30s   Thyroid disease Sister    Breast cancer Sister    Multiple sclerosis Sister    Other Sister        guillain barre   Depression Sister    Migraines Sister    Arthritis Mother    Hypertension Mother    Thyroid disease Mother    Depression Mother    Migraines Mother    Arthritis Father    Hypertension Father    Depression Father    Hypertension Maternal Grandmother    Depression Maternal Grandmother    Anxiety disorder Maternal Grandmother    Hypertension Maternal Grandfather    Hypertension Paternal Grandmother    Hypertension Paternal Grandfather    CVA Sister    Thyroid disease Sister    Depression Sister    Migraines Sister    Asthma Daughter    Depression Daughter    Anxiety disorder Daughter    Irritable bowel syndrome Daughter    Lactose intolerance Daughter    Migraines Daughter    Asthma Son    Depression Son    Anxiety disorder Son    Irritable bowel syndrome Son    Lactose intolerance Son    Migraines Son    Asthma Son    Depression Son    Anxiety disorder Son    Irritable bowel syndrome Son    Lactose  intolerance Son    Migraines Son     Her Social History Is Significant For: Social History   Socioeconomic History   Marital status: Married    Spouse name: Not on file   Number of children: Not on file   Years of education: Not  on file   Highest education level: Associate degree: occupational, Scientist, product/process development, or vocational program  Occupational History   Not on file  Tobacco Use   Smoking status: Former    Types: Cigarettes   Smokeless tobacco: Never  Substance and Sexual Activity   Alcohol use: Never   Drug use: Never   Sexual activity: Not on file  Other Topics Concern   Not on file  Social History Narrative   Not on file   Social Determinants of Health   Financial Resource Strain: Low Risk  (05/07/2023)   Overall Financial Resource Strain (CARDIA)    Difficulty of Paying Living Expenses: Not hard at all  Food Insecurity: No Food Insecurity (05/07/2023)   Hunger Vital Sign    Worried About Running Out of Food in the Last Year: Never true    Ran Out of Food in the Last Year: Never true  Transportation Needs: No Transportation Needs (05/07/2023)   PRAPARE - Administrator, Civil Service (Medical): No    Lack of Transportation (Non-Medical): No  Physical Activity: Sufficiently Active (05/07/2023)   Exercise Vital Sign    Days of Exercise per Week: 5 days    Minutes of Exercise per Session: 30 min  Stress: No Stress Concern Present (05/07/2023)   [employer] of Occupational Health - Occupational Stress Questionnaire    Feeling of Stress : Not at all  Social Connections: Moderately Integrated (05/07/2023)   Social Connection and Isolation Panel [NHANES]    Frequency of Communication with Friends and Family: More than three times a week    Frequency of Social Gatherings with Friends and Family: Once a week    Attends Religious Services: 1 to 4 times per year    Active Member of [employer] or Organizations: No    Attends Engineer, structural: Not on file     Marital Status: Married    Her Allergies Are:  Allergies  Allergen Reactions   Iodine Anaphylaxis   Iohexol Anaphylaxis    Pre meds given in past with no reaction noted.     Latex Anaphylaxis and Other (See Comments)   Topamax [Topiramate] Anaphylaxis   Zanaflex [Tizanidine] Shortness Of Breath    Swelling, chest pain   :   Her Current Medications Are:  Outpatient Encounter Medications as of 07/23/2023  Medication Sig   albuterol (ACCUNEB) 0.63 MG/3ML nebulizer solution Take 3 mLs (0.63 mg total) by nebulization every 6 (six) hours as needed for wheezing.   albuterol (VENTOLIN HFA) 108 (90 Base) MCG/ACT inhaler Inhale 2 puffs into the lungs every 6 (six) hours as needed for wheezing or shortness of breath.   albuterol (VENTOLIN HFA) 108 (90 Base) MCG/ACT inhaler Inhale 2 puffs into the lungs every 6 (six) hours as needed for wheezing or shortness of breath.   aspirin EC 81 MG tablet Take 1 tablet (81 mg total) by mouth 2 (two) times daily. For 2 weeks then back to once a day for DVT prevention.   buPROPion (WELLBUTRIN XL) 300 MG 24 hr tablet Take 1 tablet (300 mg total) by mouth daily.   docusate sodium (COLACE) 100 MG capsule Take 1 capsule (100 mg total) by mouth daily. (Patient taking differently: Take 100 mg by mouth 2 (two) times daily.)   fluticasone (FLONASE) 50 MCG/ACT nasal spray Place 1 spray into both nostrils 2 (two) times daily.   Fremanezumab-vfrm (AJOVY) 225 MG/1.5ML SOAJ Inject 225 mg into the skin every 30 (thirty) days.   linaclotide (  LINZESS) 72 MCG capsule Take 1 capsule (72 mcg total) by mouth daily before breakfast.   losartan (COZAAR) 50 MG tablet Take 1 tablet (50 mg total) by mouth daily.   Multiple Vitamins-Minerals (MULTIVITAMIN ADULT PO) Take 1 tablet by mouth daily.   Rimegepant Sulfate (NURTEC) 75 MG TBDP Take 1 tablet (75 mg total) by mouth as needed (migraine).   SYNTHROID 100 MCG tablet Take 100 mcg by mouth every other day.   Tiotropium Bromide  Monohydrate (SPIRIVA RESPIMAT) 1.25 MCG/ACT AERS Inhale 2 puffs into the lungs daily. (Patient taking differently: Inhale 2 puffs into the lungs 2 (two) times daily.)   traZODone (DESYREL) 50 MG tablet Take 0.5-1 tablets (25-50 mg total) by mouth at bedtime as needed for sleep.   Vitamin D, Ergocalciferol, (DRISDOL) 1.25 MG (50000 UT) CAPS capsule Take 50,000 Units by mouth every 7 (seven) days.   No facility-administered encounter medications on file as of 07/23/2023.  :  Review of Systems:  Out of a complete 14 point review of systems, all are reviewed and negative with the exception of these symptoms as listed below:   Review of Systems  Neurological:        Rm 5 with spouse Olivia Zamora  Pt is well, reports she has witnessed apneas, choking and snoring while she is asleep. She also feels she stops breathing while she is awake. She hasn't used cpap in over a yr.      Objective:  Neurological Exam  Physical Exam Physical Examination:   Vitals:   07/23/23 1008  BP: 126/87  Pulse: 93    General Examination: The patient is a very pleasant 52 y.o. female in no acute distress. She appears well-developed and well groomed.   HEENT: Normocephalic, atraumatic, pupils are equal, round and reactive to light, extraocular tracking is good without limitation to gaze excursion or nystagmus noted. Hearing is grossly intact. Face is symmetric with normal facial animation. Speech is clear with no dysarthria noted. There is no hypophonia. There is no lip, neck/head, jaw or voice tremor. Neck is supple with full range of passive and active motion. There are no carotid bruits on auscultation. Oropharynx exam reveals: mild mouth dryness, adequate dental hygiene with several teeth missing, mild airway crowding secondary to small airway entry and redundant soft palate, tonsils on the smaller side, Mallampati class II, neck circumference 14 three-quarter inches, minimal overbite noted.  Tongue protrudes centrally and  palate elevates symmetrically.   Chest: Clear to auscultation without wheezing, rhonchi or crackles noted.  Heart: S1+S2+0, regular and normal without murmurs, rubs or gallops noted.   Abdomen: Soft, non-tender and non-distended.  Extremities: There is no pitting edema in the distal lower extremities bilaterally.   Skin: Warm and dry without trophic changes noted.   Musculoskeletal: exam reveals decreased range of motion left knee, she had knee replacement surgery in June 2024.    Neurologically:  Mental status: The patient is awake, alert and oriented in all 4 spheres. Her immediate and remote memory, attention, language skills and fund of knowledge are appropriate. There is no evidence of aphasia, agnosia, apraxia or anomia. Speech is clear with normal prosody and enunciation. Thought process is linear. Mood is normal and affect is normal.  Cranial nerves II - XII are as described above under HEENT exam.  Motor exam: Normal bulk, strength and tone is noted. There is no obvious action or resting tremor.  Fine motor skills and coordination: grossly intact.  Cerebellar testing: No dysmetria or intention tremor.  There is no truncal or gait ataxia.  Sensory exam: intact to light touch in the upper and lower extremities.  Gait, station and balance: She stands slowly, she walks with a single-point cane, she has a limp on the left.   Assessment and Plan:   In summary, Olivia Zamora is a very pleasant 52 y.o.-year old female with an underlying medical history of hypertension, thyroid disease, irritable bowel syndrome, vitamin D deficiency, chronic back pain, depression, anxiety, arthritis, asthma, eczema, reflux disease, history of DVT, migraine headaches (followed by my colleague Dr. Marjory Zamora), and overweight state, who presents for evaluation of her obstructive sleep apnea.  She was originally diagnosed several years ago and [place] her latest home sleep [place]   She has an AutoPap machine but has not used it since it was recalled.  She has had significant weight loss over time.  She is advised to proceed with repeat evaluation with a home sleep test at this time.  While a laboratory attended sleep study is typically considered "gold standard" for evaluation of sleep disordered breathing, we mutually agreed to pursue a home sleep test.   I had a long chat with the patient and her husband about my findings and the diagnosis of sleep apnea, particularly OSA, its prognosis and treatment options. We talked about medical/conservative treatments, surgical interventions and non-pharmacological approaches for symptom control. I explained, in particular, the risks and ramifications of untreated moderate to severe OSA, especially with respect to developing cardiovascular disease down the road, including congestive heart failure (CHF), difficult to treat hypertension, cardiac arrhythmias (particularly A-fib), neurovascular complications including TIA, stroke and dementia. Even type 2 diabetes has, in part, been linked to untreated OSA. Symptoms of untreated OSA may include (but may not be limited to) daytime sleepiness, nocturia (i.e. frequent nighttime urination), memory problems, mood irritability and suboptimally controlled or worsening mood disorder such as depression and/or anxiety, lack of energy, lack of motivation, physical discomfort, as well as recurrent headaches, especially morning or nocturnal headaches. We talked about the importance of maintaining a healthy lifestyle and striving for healthy weight. In addition, we talked about the importance of striving for and maintaining good sleep hygiene.  She is advised to continue with her current medication regimen including trazodone at night, she is encouraged to increase it to 100 mg as needed as per current prescription. I recommended a sleep study at this time. I outlined the differences between a laboratory attended sleep  study which is considered more comprehensive and accurate over the option of a home sleep test (HST); the latter may lead to underestimation of sleep disordered breathing in some instances and does not help with diagnosing upper airway resistance syndrome and is not accurate enough to diagnose primary central sleep apnea typically. I outlined possible surgical and non-surgical treatment options of OSA, including the use of a positive airway pressure (PAP) device (i.e. CPAP, AutoPAP/APAP or BiPAP in certain circumstances), a custom-made dental device (aka oral appliance, which would require a referral to a specialist dentist or orthodontist typically, and is generally speaking not considered for patients with full dentures or edentulous state), upper airway surgical options, such as traditional UPPP (which is not considered a first-line treatment) or the Inspire device (hypoglossal nerve stimulator, which would involve a referral for consultation with an ENT surgeon, after careful selection, following inclusion criteria - also not first-line treatment). I explained the PAP treatment option to the patient in detail, as this is generally  considered first-line treatment.  The patient indicated that she would be interested in getting back on PAP therapy.      We will pick up our discussion about the next steps and treatment options after testing.  We will keep her posted as to the test results by phone call and/or MyChart messaging where possible.  We will plan to follow-up in sleep clinic accordingly as well.  I answered all their questions today and the patient and her husband were in agreement.   I encouraged her to call with any interim questions, concerns, problems or updates or email [place] through MyChart.  Generally speaking, sleep test authorizations may take up to 2 weeks, sometimes less, sometimes longer, the patient is encouraged to get in touch with [place] if they do not hear back from the sleep lab staff  directly within the next 2 weeks.  Thank you very much for allowing me to participate in the care of this nice patient. If I can be of any further assistance to you please do not hesitate to call me at [phone].  Sincerely,   Huston Foley, MD, PhD

## 2023-07-24 ENCOUNTER — Ambulatory Visit: Payer: BLUE CROSS/BLUE SHIELD | Admitting: Family Medicine

## 2023-07-25 ENCOUNTER — Ambulatory Visit: Payer: BLUE CROSS/BLUE SHIELD

## 2023-07-25 DIAGNOSIS — M25562 Pain in left knee: Secondary | ICD-10-CM | POA: Diagnosis not present

## 2023-07-25 DIAGNOSIS — G8929 Other chronic pain: Secondary | ICD-10-CM

## 2023-07-25 DIAGNOSIS — M25662 Stiffness of left knee, not elsewhere classified: Secondary | ICD-10-CM

## 2023-07-25 DIAGNOSIS — M6281 Muscle weakness (generalized): Secondary | ICD-10-CM

## 2023-07-25 DIAGNOSIS — R600 Localized edema: Secondary | ICD-10-CM

## 2023-07-25 NOTE — Therapy (Signed)
OUTPATIENT PHYSICAL THERAPY LOWER EXTREMITY TREATMENT   Patient Name: Olivia Zamora MRN: [id number] DOB:10-14-1971, 52 y.o., female Today's Date: 07/25/2023  END OF SESSION:  PT End of Session - 07/25/23 1305     Visit Number 9    Number of Visits 12    Date for PT Re-Evaluation 08/01/23    Authorization Type FOTO.    PT Start Time 1302    PT Stop Time 1343    PT Time Calculation (min) 41 min    Activity Tolerance Patient tolerated treatment well    Behavior During Therapy WFL for tasks assessed/performed              Past Medical History:  Diagnosis Date   Anxiety    Failed therapy with Paxil, Lexparo, Prozac, Wellbutrin, Xanax, and Ativan   Arthritis    Asthma    Cardiac disease    Angina   Chronic back pain    Constipation 08/26/2019   Depression    Failed therapy with Paxil, Lexparo, Prozac, Wellbutrin, and Ativan   Disease of thyroid gland 12/28/2011   multiple thyroid nodules in 2013 - [place] on 03/08/14 showed heterogeneous appearance of thyroid gland without focal nodule   Eczema    Generalized seizure (HCC)    stress related   GERD (gastroesophageal reflux disease)    History of DVT (deep vein thrombosis) 1988   Hypertension    Hyperthyroidism    Hypothyroidism    IBS (irritable bowel syndrome)    Internal derangement of left knee    Lactose intolerance    Migraine    Sleep apnea    wears CPAP   Vitamin D deficiency    Past Surgical History:  Procedure Laterality Date   CESAREAN SECTION     x3   COLONOSCOPY     HEMANGIOMA EXCISION Right    shoulder   IR RADIOLOGIST EVAL & MGMT  05/04/2021   MASS EXCISION Right    lump from palm of hand   OOPHORECTOMY     OTHER SURGICAL HISTORY Right    Repair of tib-fib fracture   TOTAL ABDOMINAL HYSTERECTOMY     TOTAL KNEE ARTHROPLASTY Left 06/04/2023   Procedure: LEFT TOTAL KNEE ARTHROPLASTY;  Surgeon: Marcene Corning, MD;  Location: [facility];  Service: Orthopedics;  Laterality: Left;   TUBAL LIGATION      TUMOR EXCISION Right    Axilla - benign   VASCULAR SURGERY Right 1989   blood clot removed from neck   Patient Active Problem List   Diagnosis Date Noted   Mixed hyperlipidemia 05/09/2023   Essential hypertension 11/21/2022   Primary insomnia 11/21/2022   Controlled substance agreement signed 11/21/2022   Acquired hypothyroidism 11/21/2022   H/O: CVA (cerebrovascular accident) 08/01/2021   History of DVT (deep vein thrombosis) 08/01/2021   Symptomatic mammary hypertrophy 06/13/2021   IBS (irritable bowel syndrome) 08/26/2019   Vitamin D deficiency    Sleep apnea    Depression    Anxiety    Asthma    Disease of thyroid gland 12/28/2011   REFERRING PROVIDER: Marcene Corning MD  REFERRING DIAG: S/p left total knee replacement.  THERAPY DIAG:  Chronic pain of left knee  Stiffness of left knee, not elsewhere classified  Localized edema  Muscle weakness (generalized)  Rationale for Evaluation and Treatment: Rehabilitation  ONSET DATE: Ongoing. Surgery (06/04/23).  SUBJECTIVE:   SUBJECTIVE STATEMENT: Patient reports that her knee is hurting today.   PERTINENT HISTORY: Please see above.  PAIN:  Are  you having pain? Yes: NPRS scale: 7/10 Pain location: Left knee. Pain description: Ache, throb, sore. Aggravating factors: "Everything." Relieving factors: 'Not much."  PRECAUTIONS: Other: No ultrasound.  WEIGHT BEARING RESTRICTIONS: No  FALLS:  Has patient fallen in last 6 months? No  LIVING ENVIRONMENT: Lives with: lives with their spouse Lives in: House/apartment Stairs: Yes.  Non-reciprocating pattern. Has following equipment at home:  Standard walker.  PLOF: Independent with basic ADLs  PATIENT GOALS: Get her life back.  Return to gym workouts which she loved.  Next MD follow up:  August 2024   OBJECTIVE:   PATIENT SURVEYS:  FOTO 49  EDEMA:  Circumferential: Right 5 cms > left.  PALPATION: C/o diffuse left knee pain currently.  Incision appears  to be healing well.  LOWER EXTREMITY ROM:   Active  Left eval Left 07/02/23  Hip flexion    Hip extension    Hip abduction    Hip adduction    Hip internal rotation    Hip external rotation    Knee flexion 75 85  Knee extension -15 -12  Ankle dorsiflexion    Ankle plantarflexion    Ankle inversion    Ankle eversion     (Blank rows = not tested)   LOWER EXTREMITY MMT: Patient unable to perform an active antigravity left SLR currently.  She has a decrease in volitional activation of her left quadriceps musculature.   GAIT: Soreness reported with knee extension locked during swing phase.  TODAY'STREATMENT:                                                                                                                              DATE:                                   07/25/23 EXERCISE LOG  Exercise Repetitions and Resistance Comments  Recumbent bike  L4 x 16 minutes; seat 5   Cybex knee extension  10# x 3 minutes  Using BLE   Cybex knee flexion  40# x 3 minutes  Using BLE  Leg press  1 plate; seat 4 x 3 minutes    Rocker board  5 minutes    Blank cell = exercise not performed today                                    07/22/23 EXERCISE LOG  Exercise Repetitions and Resistance Comments  Recumbent bike  L3 x 18 minutes; seat 4   Stepping strategy   For proper gait mechanics  Gait training     Blank cell = exercise not performed today  Modalities: no adverse reaction to today's modalities  Date:  Vaso: Knee, 34 degrees; low pressure, 10 mins, Pain and Edema   07/16/23 EXERCISE LOG  Exercise Repetitions and Resistance  Comments  Nustep L3 x 16 minutes, seat 7   Recumbent bike 10 minutes seat 7   Knee ext 10# x 3 minutes   Ham curls 30# x 3 minutes   Leg press  1 pl, seat 6 x20 reps                Modalities  Date: 07/16/23 Vaso: Knee, Low, 10 mins, Pain  PATIENT EDUCATION:  Education details: healing, benefits of exercise, overuse, HEP, and walking Person  educated: Patient Education method: Explanation Education comprehension: verbalized understanding  HOME EXERCISE PROGRAM: [id number]  ASSESSMENT:  CLINICAL IMPRESSION: Patient presented to treatment reporting increased knee pain with no known cause. She was progressed with familiar interventions for improved knee mobility and muscular strength with moderate difficulty. She required minimal cueing with today' s interventions for proper exercise performance. She experienced no increase in pain or discomfort with any of today's interventions. She reported that her knee felt "good" upon the conclusion of treatment. She continues to require skilled physical therapy to address her remaining impairments to return to her prior level of function.    OBJECTIVE IMPAIRMENTS: Abnormal gait, decreased activity tolerance, decreased mobility, decreased ROM, decreased strength, increased edema, and pain.   ACTIVITY LIMITATIONS: lifting, transfers, bed mobility, and locomotion level  PARTICIPATION LIMITATIONS: meal prep, cleaning, and laundry  PERSONAL FACTORS: Time since onset of injury/illness/exacerbation are also affecting patient's functional outcome.   REHAB POTENTIAL: Excellent  CLINICAL DECISION MAKING: Stable/uncomplicated  EVALUATION COMPLEXITY: Low  GOALS:  SHORT TERM GOALS: Target date: 07/04/23  Ind with an initial HEP. Goal status: MET  2.  Full active left knee extension in order to normalize gait.  Goal status: On-going  3.  Active left knee flexion to 90 degrees.  Goal status: On-going  LONG TERM GOALS: Target date: 08/01/23.  Ind with an advanced HEP.  Goal status: On-going  2.  Active left knee flexion to 115 degrees+ so the patient can perform functional tasks and do so with pain not > 2-3/10.  Goal status: On-going  3.  Increase left hip and knee strength to a solid 4+/5 to provide good stability for accomplishment of functional activities.  Goal status: On-going  4.   Perform a reciprocating stair gait with one railing with pain not > 2-3/10.  Goal status: On-going  PLAN:  PT FREQUENCY:  2-3 times a week  PT DURATION: 6 weeks  PLANNED INTERVENTIONS: Therapeutic exercises, Therapeutic activity, Neuromuscular re-education, Gait training, Patient/Family education, Self Care, Electrical stimulation, Cryotherapy, Moist heat, Vasopneumatic device, and Manual therapy  PLAN FOR NEXT SESSION: Nustep, PROM, review HEP.  VMS to left quadriceps.  TKA protocol progression.    Granville Lewis, PT 07/25/2023, 2:00 PM

## 2023-07-28 ENCOUNTER — Encounter: Payer: Self-pay | Admitting: Family Medicine

## 2023-07-29 ENCOUNTER — Ambulatory Visit: Payer: BLUE CROSS/BLUE SHIELD | Admitting: Physical Therapy

## 2023-07-29 DIAGNOSIS — M25662 Stiffness of left knee, not elsewhere classified: Secondary | ICD-10-CM

## 2023-07-29 DIAGNOSIS — M6281 Muscle weakness (generalized): Secondary | ICD-10-CM

## 2023-07-29 DIAGNOSIS — R600 Localized edema: Secondary | ICD-10-CM

## 2023-07-29 DIAGNOSIS — M25562 Pain in left knee: Secondary | ICD-10-CM | POA: Diagnosis not present

## 2023-07-29 DIAGNOSIS — G8929 Other chronic pain: Secondary | ICD-10-CM

## 2023-07-29 NOTE — Therapy (Addendum)
OUTPATIENT PHYSICAL THERAPY LOWER EXTREMITY TREATMENT   Patient Name: Olivia Zamora MRN: [id number] DOB:27-Mar-1971, 52 y.o., female Today's Date: 07/29/2023  END OF SESSION:  PT End of Session - 07/29/23 1446     Visit Number 10    Number of Visits 12    Date for PT Re-Evaluation 08/01/23    Authorization Type FOTO.    PT Start Time [phone]    PT Stop Time 0230    PT Time Calculation (min) 44 min    Activity Tolerance Patient tolerated treatment well    Behavior During Therapy WFL for tasks assessed/performed               Past Medical History:  Diagnosis Date   Anxiety    Failed therapy with Paxil, Lexparo, Prozac, Wellbutrin, Xanax, and Ativan   Arthritis    Asthma    Cardiac disease    Angina   Chronic back pain    Constipation 08/26/2019   Depression    Failed therapy with Paxil, Lexparo, Prozac, Wellbutrin, and Ativan   Disease of thyroid gland 12/28/2011   multiple thyroid nodules in 2013 - [place] on 03/08/14 showed heterogeneous appearance of thyroid gland without focal nodule   Eczema    Generalized seizure (HCC)    stress related   GERD (gastroesophageal reflux disease)    History of DVT (deep vein thrombosis) 1988   Hypertension    Hyperthyroidism    Hypothyroidism    IBS (irritable bowel syndrome)    Internal derangement of left knee    Lactose intolerance    Migraine    Sleep apnea    wears CPAP   Vitamin D deficiency    Past Surgical History:  Procedure Laterality Date   CESAREAN SECTION     x3   COLONOSCOPY     HEMANGIOMA EXCISION Right    shoulder   IR RADIOLOGIST EVAL & MGMT  05/04/2021   MASS EXCISION Right    lump from palm of hand   OOPHORECTOMY     OTHER SURGICAL HISTORY Right    Repair of tib-fib fracture   TOTAL ABDOMINAL HYSTERECTOMY     TOTAL KNEE ARTHROPLASTY Left 06/04/2023   Procedure: LEFT TOTAL KNEE ARTHROPLASTY;  Surgeon: Marcene Corning, MD;  Location: [facility];  Service: Orthopedics;  Laterality: Left;   TUBAL LIGATION      TUMOR EXCISION Right    Axilla - benign   VASCULAR SURGERY Right 1989   blood clot removed from neck   Patient Active Problem List   Diagnosis Date Noted   Mixed hyperlipidemia 05/09/2023   Essential hypertension 11/21/2022   Primary insomnia 11/21/2022   Controlled substance agreement signed 11/21/2022   Acquired hypothyroidism 11/21/2022   H/O: CVA (cerebrovascular accident) 08/01/2021   History of DVT (deep vein thrombosis) 08/01/2021   Symptomatic mammary hypertrophy 06/13/2021   IBS (irritable bowel syndrome) 08/26/2019   Vitamin D deficiency    Sleep apnea    Depression    Anxiety    Asthma    Disease of thyroid gland 12/28/2011   REFERRING PROVIDER: Marcene Corning MD  REFERRING DIAG: S/p left total knee replacement.  THERAPY DIAG:  Chronic pain of left knee  Stiffness of left knee, not elsewhere classified  Localized edema  Muscle weakness (generalized)  Rationale for Evaluation and Treatment: Rehabilitation  ONSET DATE: Ongoing. Surgery (06/04/23).  SUBJECTIVE:   SUBJECTIVE STATEMENT: Doing okay.  PERTINENT HISTORY: Please see above.  PAIN:  Are you having pain? Yes: NPRS scale:  7/10 Pain location: Left knee. Pain description: Ache, throb, sore. Aggravating factors: "Everything." Relieving factors: 'Not much."  PRECAUTIONS: Other: No ultrasound.  WEIGHT BEARING RESTRICTIONS: No  FALLS:  Has patient fallen in last 6 months? No  LIVING ENVIRONMENT: Lives with: lives with their spouse Lives in: House/apartment Stairs: Yes.  Non-reciprocating pattern. Has following equipment at home:  Standard walker.  PLOF: Independent with basic ADLs  PATIENT GOALS: Get her life back.  Return to gym workouts which she loved.  Next MD follow up:  August 2024   OBJECTIVE:   PATIENT SURVEYS:  FOTO 49  EDEMA:  Circumferential: Right 5 cms > left.  PALPATION: C/o diffuse left knee pain currently.  Incision appears to be healing well.  LOWER  EXTREMITY ROM:   Active  Left eval Left 07/02/23  Hip flexion    Hip extension    Hip abduction    Hip adduction    Hip internal rotation    Hip external rotation    Knee flexion 75 85  Knee extension -15 -12  Ankle dorsiflexion    Ankle plantarflexion    Ankle inversion    Ankle eversion     (Blank rows = not tested)   LOWER EXTREMITY MMT: Patient unable to perform an active antigravity left SLR currently.  She has a decrease in volitional activation of her left quadriceps musculature.   GAIT: Soreness reported with knee extension locked during swing phase.  TODAY'STREATMENT:                                                                                                                              DATE: 07/29/23                                     EXERCISE LOG  Exercise Repetitions and Resistance Comments  Recumbent bike Level 3 x 16 minutes   Knee ext. 10# x 3 minutes   Ham curls 30# x 3 minutes   Leg Press with adductor squeeze 2 plates x 3 minutes.       In supine:  1-1 stretching into left knee flexion and extension with LLLDS x 14 minutes.              PATIENT EDUCATION:  Education details: healing, benefits of exercise, overuse, HEP, and walking Person educated: Patient Education method: Explanation Education comprehension: verbalized understanding  HOME EXERCISE PROGRAM: [id number]  ASSESSMENT:  CLINICAL IMPRESSION: Patient is highly motivated and did a great job.  Added weight to leg press which she performed without difficulty.  Continued to PROM to maximize range of motion.  OBJECTIVE IMPAIRMENTS: Abnormal gait, decreased activity tolerance, decreased mobility, decreased ROM, decreased strength, increased edema, and pain.   ACTIVITY LIMITATIONS: lifting, transfers, bed mobility, and locomotion level  PARTICIPATION LIMITATIONS: meal prep, cleaning, and laundry  PERSONAL FACTORS: Time since onset of injury/illness/exacerbation are  also affecting  patient's functional outcome.   REHAB POTENTIAL: Excellent  CLINICAL DECISION MAKING: Stable/uncomplicated  EVALUATION COMPLEXITY: Low  GOALS:  SHORT TERM GOALS: Target date: 07/04/23  Ind with an initial HEP. Goal status: MET  2.  Full active left knee extension in order to normalize gait.  Goal status: On-going  3.  Active left knee flexion to 90 degrees.  Goal status: MET  LONG TERM GOALS: Target date: 08/01/23.  Ind with an advanced HEP.  Goal status: On-going  2.  Active left knee flexion to 115 degrees+ so the patient can perform functional tasks and do so with pain not > 2-3/10.  Goal status: On-going  3.  Increase left hip and knee strength to a solid 4+/5 to provide good stability for accomplishment of functional activities.  Goal status: On-going  4.  Perform a reciprocating stair gait with one railing with pain not > 2-3/10.  Goal status: On-going  PLAN:  PT FREQUENCY:  2-3 times a week  PT DURATION: 6 weeks  PLANNED INTERVENTIONS: Therapeutic exercises, Therapeutic activity, Neuromuscular re-education, Gait training, Patient/Family education, Self Care, Electrical stimulation, Cryotherapy, Moist heat, Vasopneumatic device, and Manual therapy  PLAN FOR NEXT SESSION: Nustep, PROM, review HEP.  VMS to left quadriceps.  TKA protocol progression.    Progress Note Reporting Period 06/20/23 to 07/29/23  See note below for Objective Data and Assessment of Progress/Goals. Patient is highly motivated and is making excellent progress toward goals.       Emmagrace Runkel, [place], PT 07/29/2023, 2:47 PM

## 2023-07-30 ENCOUNTER — Ambulatory Visit: Payer: BLUE CROSS/BLUE SHIELD | Admitting: Family Medicine

## 2023-07-30 ENCOUNTER — Other Ambulatory Visit: Payer: Self-pay | Admitting: Family Medicine

## 2023-08-01 ENCOUNTER — Ambulatory Visit: Payer: BLUE CROSS/BLUE SHIELD

## 2023-08-01 DIAGNOSIS — G8929 Other chronic pain: Secondary | ICD-10-CM

## 2023-08-01 DIAGNOSIS — M25562 Pain in left knee: Secondary | ICD-10-CM | POA: Diagnosis not present

## 2023-08-01 DIAGNOSIS — M6281 Muscle weakness (generalized): Secondary | ICD-10-CM

## 2023-08-01 DIAGNOSIS — M25662 Stiffness of left knee, not elsewhere classified: Secondary | ICD-10-CM

## 2023-08-01 DIAGNOSIS — R600 Localized edema: Secondary | ICD-10-CM

## 2023-08-01 NOTE — Therapy (Signed)
OUTPATIENT PHYSICAL THERAPY LOWER EXTREMITY TREATMENT   Patient Name: Olivia Zamora MRN: [id number] DOB:Jun 28, 1971, 52 y.o., female Today's Date: 08/01/2023  END OF SESSION:  PT End of Session - 08/01/23 1345     Visit Number 11    Number of Visits 12    Date for PT Re-Evaluation 08/01/23    Authorization Type FOTO.    PT Start Time 1345    PT Stop Time 1430    PT Time Calculation (min) 45 min    Activity Tolerance Patient tolerated treatment well    Behavior During Therapy WFL for tasks assessed/performed                Past Medical History:  Diagnosis Date   Anxiety    Failed therapy with Paxil, Lexparo, Prozac, Wellbutrin, Xanax, and Ativan   Arthritis    Asthma    Cardiac disease    Angina   Chronic back pain    Constipation 08/26/2019   Depression    Failed therapy with Paxil, Lexparo, Prozac, Wellbutrin, and Ativan   Disease of thyroid gland 12/28/2011   multiple thyroid nodules in 2013 - [place] on 03/08/14 showed heterogeneous appearance of thyroid gland without focal nodule   Eczema    Generalized seizure (HCC)    stress related   GERD (gastroesophageal reflux disease)    History of DVT (deep vein thrombosis) 1988   Hypertension    Hyperthyroidism    Hypothyroidism    IBS (irritable bowel syndrome)    Internal derangement of left knee    Lactose intolerance    Migraine    Sleep apnea    wears CPAP   Vitamin D deficiency    Past Surgical History:  Procedure Laterality Date   CESAREAN SECTION     x3   COLONOSCOPY     HEMANGIOMA EXCISION Right    shoulder   IR RADIOLOGIST EVAL & MGMT  05/04/2021   MASS EXCISION Right    lump from palm of hand   OOPHORECTOMY     OTHER SURGICAL HISTORY Right    Repair of tib-fib fracture   TOTAL ABDOMINAL HYSTERECTOMY     TOTAL KNEE ARTHROPLASTY Left 06/04/2023   Procedure: LEFT TOTAL KNEE ARTHROPLASTY;  Surgeon: Marcene Corning, MD;  Location: [facility];  Service: Orthopedics;  Laterality: Left;   TUBAL LIGATION      TUMOR EXCISION Right    Axilla - benign   VASCULAR SURGERY Right 1989   blood clot removed from neck   Patient Active Problem List   Diagnosis Date Noted   Mixed hyperlipidemia 05/09/2023   Essential hypertension 11/21/2022   Primary insomnia 11/21/2022   Controlled substance agreement signed 11/21/2022   Acquired hypothyroidism 11/21/2022   H/O: CVA (cerebrovascular accident) 08/01/2021   History of DVT (deep vein thrombosis) 08/01/2021   Symptomatic mammary hypertrophy 06/13/2021   IBS (irritable bowel syndrome) 08/26/2019   Vitamin D deficiency    Sleep apnea    Depression    Anxiety    Asthma    Disease of thyroid gland 12/28/2011   REFERRING PROVIDER: Marcene Corning MD  REFERRING DIAG: S/p left total knee replacement.  THERAPY DIAG:  Chronic pain of left knee  Stiffness of left knee, not elsewhere classified  Localized edema  Muscle weakness (generalized)  Rationale for Evaluation and Treatment: Rehabilitation  ONSET DATE: Ongoing. Surgery (06/04/23).  SUBJECTIVE:   SUBJECTIVE STATEMENT: Patient reports that she was not having any pain yesterday. However, when she was sleeping last night  she was moving a lot and noticed that her knee was really swollen this morning.   PERTINENT HISTORY: Please see above.  PAIN:  Are you having pain? Yes: NPRS scale: 6/10 Pain location: Left knee. Pain description: Ache, throb, sore. Aggravating factors: "Everything." Relieving factors: 'Not much."  PRECAUTIONS: Other: No ultrasound.  WEIGHT BEARING RESTRICTIONS: No  FALLS:  Has patient fallen in last 6 months? No  LIVING ENVIRONMENT: Lives with: lives with their spouse Lives in: House/apartment Stairs: Yes.  Non-reciprocating pattern. Has following equipment at home:  Standard walker.  PLOF: Independent with basic ADLs  PATIENT GOALS: Get her life back.  Return to gym workouts which she loved.  Next MD follow up:  August 2024   OBJECTIVE:   PATIENT  SURVEYS:  FOTO 49  EDEMA:  Circumferential: Right 5 cms > left.  PALPATION: C/o diffuse left knee pain currently.  Incision appears to be healing well.  LOWER EXTREMITY ROM:   Active  Left eval Left 07/02/23  Hip flexion    Hip extension    Hip abduction    Hip adduction    Hip internal rotation    Hip external rotation    Knee flexion 75 85  Knee extension -15 -12  Ankle dorsiflexion    Ankle plantarflexion    Ankle inversion    Ankle eversion     (Blank rows = not tested)   LOWER EXTREMITY MMT: Patient unable to perform an active antigravity left SLR currently.  She has a decrease in volitional activation of her left quadriceps musculature.   GAIT: Soreness reported with knee extension locked during swing phase.  TODAY'STREATMENT:                                                                                                                              DATE:                                   08/01/23 EXERCISE LOG  Exercise Repetitions and Resistance Comments  Recumbent bike  L3 x 17 minutes; seat 7-6   Cybex knee flexion  40# x 2 minutes   Cybex knee extension  20# x 20 reps    Cybex leg press  2 plates; seat 5 x 2.5 minutes    Rocker board  4 minutes     Heel slide  5 reps  Max 121 degrees   Low load stretch for knee extension  8 minutes w/ 3# weight on knee  With zero knee   Blank cell = exercise not performed today    PATIENT EDUCATION:  Education details: healing, benefits of exercise, overuse, HEP, and walking Person educated: Patient Education method: Explanation Education comprehension: verbalized understanding  HOME EXERCISE PROGRAM: [id number]  ASSESSMENT:  CLINICAL IMPRESSION: Patient was progressed with multiple familiar interventions for improved knee mobility and strength needed for improved function with her  daily activities. She required minimal cueing with today's interventions for proper exercise performance. She was able to demonstrate  121 degrees of active left knee flexion upon the conclusion of heel slides She reported that her knee felt good upon the conclusion of treatment. She continues to require skilled physical therapy to address her remaining impairments to return to her prior level of function.    OBJECTIVE IMPAIRMENTS: Abnormal gait, decreased activity tolerance, decreased mobility, decreased ROM, decreased strength, increased edema, and pain.   ACTIVITY LIMITATIONS: lifting, transfers, bed mobility, and locomotion level  PARTICIPATION LIMITATIONS: meal prep, cleaning, and laundry  PERSONAL FACTORS: Time since onset of injury/illness/exacerbation are also affecting patient's functional outcome.   REHAB POTENTIAL: Excellent  CLINICAL DECISION MAKING: Stable/uncomplicated  EVALUATION COMPLEXITY: Low  GOALS:  SHORT TERM GOALS: Target date: 07/04/23  Ind with an initial HEP. Goal status: MET  2.  Full active left knee extension in order to normalize gait.  Goal status: On-going  3.  Active left knee flexion to 90 degrees.  Goal status: MET  LONG TERM GOALS: Target date: 08/01/23.  Ind with an advanced HEP.  Goal status: On-going  2.  Active left knee flexion to 115 degrees+ so the patient can perform functional tasks and do so with pain not > 2-3/10.  Goal status: On-going  3.  Increase left hip and knee strength to a solid 4+/5 to provide good stability for accomplishment of functional activities.  Goal status: On-going  4.  Perform a reciprocating stair gait with one railing with pain not > 2-3/10.  Goal status: On-going  PLAN:  PT FREQUENCY:  2-3 times a week  PT DURATION: 6 weeks  PLANNED INTERVENTIONS: Therapeutic exercises, Therapeutic activity, Neuromuscular re-education, Gait training, Patient/Family education, Self Care, Electrical stimulation, Cryotherapy, Moist heat, Vasopneumatic device, and Manual therapy  PLAN FOR NEXT SESSION: Nustep, PROM, review HEP.  VMS to left quadriceps.   TKA protocol progression.    Granville Lewis, PT 08/01/2023, 4:24 PM

## 2023-08-03 ENCOUNTER — Other Ambulatory Visit: Payer: Self-pay | Admitting: Family Medicine

## 2023-08-03 DIAGNOSIS — I10 Essential (primary) hypertension: Secondary | ICD-10-CM

## 2023-08-03 DIAGNOSIS — Z0000 Encounter for general adult medical examination without abnormal findings: Secondary | ICD-10-CM

## 2023-08-07 ENCOUNTER — Encounter: Payer: Self-pay | Admitting: Family Medicine

## 2023-08-07 ENCOUNTER — Ambulatory Visit (INDEPENDENT_AMBULATORY_CARE_PROVIDER_SITE_OTHER): Payer: BLUE CROSS/BLUE SHIELD | Admitting: Family Medicine

## 2023-08-07 VITALS — BP 127/78 | HR 92 | Temp 97.0°F | Ht 66.0 in | Wt 179.6 lb

## 2023-08-07 DIAGNOSIS — K219 Gastro-esophageal reflux disease without esophagitis: Secondary | ICD-10-CM

## 2023-08-07 DIAGNOSIS — F5101 Primary insomnia: Secondary | ICD-10-CM | POA: Diagnosis not present

## 2023-08-07 MED ORDER — HYDROXYZINE PAMOATE 50 MG PO CAPS
50.0000 mg | ORAL_CAPSULE | Freq: Every evening | ORAL | 0 refills | Status: DC | PRN
Start: 2023-08-07 — End: 2023-08-29

## 2023-08-07 MED ORDER — PANTOPRAZOLE SODIUM 40 MG PO TBEC
40.0000 mg | DELAYED_RELEASE_TABLET | Freq: Every day | ORAL | 3 refills | Status: DC
Start: 2023-08-07 — End: 2023-09-16

## 2023-08-07 NOTE — Progress Notes (Signed)
Subjective:  [name], female    DOB: 10/13/1971, 52 y.o.   MRN: [id number]  Patient Care Team: Sonny Masters, FNP as PCP - General (Family Medicine)   Chief Complaint:  Insomnia   HPI: Olivia Zamora is a 52 y.o. female presenting on 08/07/2023 for Insomnia   1. Primary insomnia She has followed up with sleep medicine and will hopefully get a new CPAP soon. She states the trazodone and melatonin did not work. Feels she would dose off at times but continued to have a hard time falling and staying asleep. She does feel her anxiety may be contributing to her inability to sleep.   2. Gastroesophageal reflux disease without esophagitis She has been taking OTC omeprazole with some relief of symptoms. States she has belching, epigastric bloating, and water brash. No voice changes or hemoptysis. No weight changes.       08/07/2023   10:44 AM 07/02/2023   11:38 AM 05/09/2023   11:01 AM 11/21/2022   10:05 AM  GAD 7 : Generalized Anxiety Score  Nervous, Anxious, on Edge 0 0 0 1  Control/stop worrying 0 0 0 1  Worry too much - different things 0 0 0 1  Trouble relaxing 0 0 0 1  Restless 0 1 0 1  Easily annoyed or irritable 1 0 0 1  Afraid - awful might happen 0 0 0 0  Total GAD 7 Score 1 1 0 6  Anxiety Difficulty Somewhat difficult Somewhat difficult Not difficult at all Somewhat difficult       08/07/2023   10:44 AM 07/02/2023   11:38 AM 05/09/2023   11:01 AM 11/21/2022   10:05 AM 06/20/2022    5:04 PM  Depression screen PHQ 2/9  Decreased Interest 1 0 0 1 3  Down, Depressed, Hopeless 1 0 0 1 2  PHQ - 2 Score 2 0 0 2 5  Altered sleeping 3 3 0 1 3  Tired, decreased energy 2 2 0 2 3  Change in appetite 1 1 0 1 2  Feeling bad or failure about yourself  0 0 0 0 3  Trouble concentrating 0 0 0 1 3  Moving slowly or fidgety/restless 0 0 0 1 3  Suicidal thoughts 0 0 0 0 1  PHQ-9 Score 8 6 0 8 23  Difficult doing work/chores Not difficult at all Somewhat difficult  Not difficult at all Somewhat difficult Very difficult     Relevant past medical, surgical, family, and social history reviewed and updated as indicated.  Allergies and medications reviewed and updated. Data reviewed: Chart in Epic.   Past Medical History:  Diagnosis Date   Anxiety    Failed therapy with Paxil, Lexparo, Prozac, Wellbutrin, Xanax, and Ativan   Arthritis    Asthma    Cardiac disease    Angina   Chronic back pain    Constipation 08/26/2019   Depression    Failed therapy with Paxil, Lexparo, Prozac, Wellbutrin, and Ativan   Disease of thyroid gland 12/28/2011   multiple thyroid nodules in 2013 - [place] on 03/08/14 showed heterogeneous appearance of thyroid gland without focal nodule   Eczema    Generalized seizure (HCC)    stress related   GERD (gastroesophageal reflux disease)    History of DVT (deep vein thrombosis) 1988   Hypertension    Hyperthyroidism    Hypothyroidism    IBS (irritable bowel syndrome)    Internal derangement of left knee  Lactose intolerance    Migraine    Sleep apnea    wears CPAP   Vitamin D deficiency     Past Surgical History:  Procedure Laterality Date   CESAREAN SECTION     x3   COLONOSCOPY     HEMANGIOMA EXCISION Right    shoulder   IR RADIOLOGIST EVAL & MGMT  05/04/2021   MASS EXCISION Right    lump from palm of hand   OOPHORECTOMY     OTHER SURGICAL HISTORY Right    Repair of tib-fib fracture   TOTAL ABDOMINAL HYSTERECTOMY     TOTAL KNEE ARTHROPLASTY Left 06/04/2023   Procedure: LEFT TOTAL KNEE ARTHROPLASTY;  Surgeon: Marcene Corning, MD;  Location: [facility];  Service: Orthopedics;  Laterality: Left;   TUBAL LIGATION     TUMOR EXCISION Right    Axilla - benign   VASCULAR SURGERY Right 1989   blood clot removed from neck    Social History   Socioeconomic History   Marital status: Married    Spouse name: Not on file   Number of children: Not on file   Years of education: Not on file   Highest education level:  Associate degree: occupational, Scientist, product/process development, or vocational program  Occupational History   Not on file  Tobacco Use   Smoking status: Former    Types: Cigarettes   Smokeless tobacco: Never  Substance and Sexual Activity   Alcohol use: Never   Drug use: Never   Sexual activity: Not on file  Other Topics Concern   Not on file  Social History Narrative   Not on file   Social Determinants of Health   Financial Resource Strain: Low Risk  (05/07/2023)   Overall Financial Resource Strain (CARDIA)    Difficulty of Paying Living Expenses: Not hard at all  Food Insecurity: No Food Insecurity (05/07/2023)   Hunger Vital Sign    Worried About Running Out of Food in the Last Year: Never true    Ran Out of Food in the Last Year: Never true  Transportation Needs: No Transportation Needs (05/07/2023)   PRAPARE - Administrator, Civil Service (Medical): No    Lack of Transportation (Non-Medical): No  Physical Activity: Sufficiently Active (05/07/2023)   Exercise Vital Sign    Days of Exercise per Week: 5 days    Minutes of Exercise per Session: 30 min  Stress: No Stress Concern Present (05/07/2023)   [employer] of Occupational Health - Occupational Stress Questionnaire    Feeling of Stress : Not at all  Social Connections: Moderately Integrated (05/07/2023)   Social Connection and Isolation Panel [NHANES]    Frequency of Communication with Friends and Family: More than three times a week    Frequency of Social Gatherings with Friends and Family: Once a week    Attends Religious Services: 1 to 4 times per year    Active Member of [employer] or Organizations: No    Attends Banker Meetings: Not on file    Marital Status: Married  Intimate Partner Violence: Not on file    Outpatient Encounter Medications as of 08/07/2023  Medication Sig   albuterol (ACCUNEB) 0.63 MG/3ML nebulizer solution Take 3 mLs (0.63 mg total) by nebulization every 6 (six) hours as needed for  wheezing.   albuterol (VENTOLIN HFA) 108 (90 Base) MCG/ACT inhaler Inhale 2 puffs into the lungs every 6 (six) hours as needed for wheezing or shortness of breath.   albuterol (VENTOLIN HFA) 108 (  90 Base) MCG/ACT inhaler Inhale 2 puffs into the lungs every 6 (six) hours as needed for wheezing or shortness of breath.   aspirin EC 81 MG tablet Take 1 tablet (81 mg total) by mouth 2 (two) times daily. For 2 weeks then back to once a day for DVT prevention.   buPROPion (WELLBUTRIN XL) 300 MG 24 hr tablet Take 1 tablet (300 mg total) by mouth daily.   docusate sodium (COLACE) 100 MG capsule Take 1 capsule (100 mg total) by mouth daily. (Patient taking differently: Take 100 mg by mouth 2 (two) times daily.)   fluticasone (FLONASE) 50 MCG/ACT nasal spray Place 1 spray into both nostrils 2 (two) times daily.   Fremanezumab-vfrm (AJOVY) 225 MG/1.5ML SOAJ Inject 225 mg into the skin every 30 (thirty) days.   hydrOXYzine (VISTARIL) 50 MG capsule Take 1 capsule (50 mg total) by mouth at bedtime as needed and may repeat dose one time if needed (insomnia).   linaclotide (LINZESS) 72 MCG capsule Take 1 capsule (72 mcg total) by mouth daily before breakfast.   losartan (COZAAR) 50 MG tablet TAKE 1 TABLET DAILY *CAMBER   Multiple Vitamins-Minerals (MULTIVITAMIN ADULT PO) Take 1 tablet by mouth daily.   pantoprazole (PROTONIX) 40 MG tablet Take 1 tablet (40 mg total) by mouth daily.   Rimegepant Sulfate (NURTEC) 75 MG TBDP Take 1 tablet (75 mg total) by mouth as needed (migraine).   SYNTHROID 100 MCG tablet Take 100 mcg by mouth every other day.   Tiotropium Bromide Monohydrate (SPIRIVA RESPIMAT) 1.25 MCG/ACT AERS Inhale 2 puffs into the lungs daily. (Patient taking differently: Inhale 2 puffs into the lungs 2 (two) times daily.)   Vitamin D, Ergocalciferol, (DRISDOL) 1.25 MG (50000 UT) CAPS capsule Take 50,000 Units by mouth every 7 (seven) days.   [DISCONTINUED] traZODone (DESYREL) 50 MG tablet Take 0.5-1 tablets  (25-50 mg total) by mouth at bedtime as needed for sleep.   No facility-administered encounter medications on file as of 08/07/2023.    Allergies  Allergen Reactions   Iodine Anaphylaxis   Iohexol Anaphylaxis    Pre meds given in past with no reaction noted.     Latex Anaphylaxis and Other (See Comments)   Topamax [Topiramate] Anaphylaxis   Zanaflex [Tizanidine] Shortness Of Breath    Swelling, chest pain     Review of Systems  Constitutional:  Positive for activity change, appetite change and fatigue. Negative for chills, diaphoresis, fever and unexpected weight change.  HENT: Negative.    Eyes: Negative.   Respiratory:  Negative for cough, chest tightness and shortness of breath.   Cardiovascular:  Negative for chest pain, palpitations and leg swelling.  Gastrointestinal:  Negative for blood in stool, constipation, diarrhea, nausea and vomiting.       GERD  Endocrine: Negative.   Genitourinary:  Negative for dysuria, frequency and urgency.  Musculoskeletal:  Negative for arthralgias and myalgias.  Skin: Negative.   Allergic/Immunologic: Negative.   Neurological:  Negative for dizziness and headaches.  Hematological: Negative.   Psychiatric/Behavioral:  Positive for agitation and sleep disturbance. Negative for behavioral problems, confusion, decreased concentration, dysphoric mood, hallucinations, self-injury and suicidal ideas. The patient is nervous/anxious. The patient is not hyperactive.   All other systems reviewed and are negative.       Objective:  BP 127/78   Pulse 92   Temp (!) 97 F (36.1 C) (Temporal)   Ht 5\' 6"  (1.676 m)   Wt 179 lb 9.6 oz (81.5 kg)   SpO2  96%   BMI 28.99 kg/m    Wt Readings from Last 3 Encounters:  08/07/23 179 lb 9.6 oz (81.5 kg)  07/23/23 180 lb (81.6 kg)  07/02/23 181 lb 9.6 oz (82.4 kg)    Physical Exam Vitals and nursing note reviewed.  Constitutional:      General: She is not in acute distress.    Appearance: Normal  appearance. She is not ill-appearing, toxic-appearing or diaphoretic.  HENT:     Head: Normocephalic and atraumatic.     Nose: Nose normal.     Mouth/Throat:     Mouth: Mucous membranes are moist.     Pharynx: Oropharynx is clear.  Eyes:     Conjunctiva/sclera: Conjunctivae normal.     Pupils: Pupils are equal, round, and reactive to light.  Cardiovascular:     Rate and Rhythm: Normal rate and regular rhythm.     Heart sounds: Normal heart sounds.  Pulmonary:     Effort: Pulmonary effort is normal.     Breath sounds: Normal breath sounds.  Abdominal:     General: Bowel sounds are normal. There is no distension.     Palpations: Abdomen is soft.     Tenderness: There is no abdominal tenderness.  Skin:    General: Skin is warm and dry.     Capillary Refill: Capillary refill takes less than 2 seconds.  Neurological:     General: No focal deficit present.     Mental Status: She is alert and oriented to person, place, and time.     Gait: Gait abnormal (using cane - recent knee surgery).  Psychiatric:        Mood and Affect: Mood normal.        Behavior: Behavior normal.        Thought Content: Thought content normal.        Judgment: Judgment normal.     Results for orders placed or performed during the hospital encounter of 06/23/23  Basic metabolic panel  Result Value Ref Range   Sodium 144 135 - 145 mmol/L   Potassium 3.2 (L) 3.5 - 5.1 mmol/L   Chloride 109 98 - 111 mmol/L   CO2 22 22 - 32 mmol/L   Glucose, Bld 107 (H) 70 - 99 mg/dL   BUN 13 6 - 20 mg/dL   Creatinine, Ser 2.95 0.44 - 1.00 mg/dL   Calcium 9.4 8.9 - 62.1 mg/dL   GFR, Estimated >30 >86 mL/min   Anion gap 13 5 - 15  CBC  Result Value Ref Range   WBC 4.5 4.0 - 10.5 K/uL   RBC 4.14 3.87 - 5.11 MIL/uL   Hemoglobin 12.8 12.0 - 15.0 g/dL   HCT 57.8 46.9 - 62.9 %   MCV 94.2 80.0 - 100.0 fL   MCH 30.9 26.0 - 34.0 pg   MCHC 32.8 30.0 - 36.0 g/dL   RDW 52.8 41.3 - 24.4 %   Platelets 199 150 - 400 K/uL    nRBC 0.0 0.0 - 0.2 %  Lipase, blood  Result Value Ref Range   Lipase 27 11 - 51 U/L  Hepatic function panel  Result Value Ref Range   Total Protein 7.1 6.5 - 8.1 g/dL   Albumin 4.4 3.5 - 5.0 g/dL   AST 22 15 - 41 U/L   ALT 18 0 - 44 U/L   Alkaline Phosphatase 72 38 - 126 U/L   Total Bilirubin 1.0 0.3 - 1.2 mg/dL   Bilirubin, Direct 0.2 0.0 -  0.2 mg/dL   Indirect Bilirubin 0.8 0.3 - 0.9 mg/dL  Troponin I (High Sensitivity)  Result Value Ref Range   Troponin I (High Sensitivity) <2 <18 ng/L  Troponin I (High Sensitivity)  Result Value Ref Range   Troponin I (High Sensitivity) <2 <18 ng/L       Pertinent labs & imaging results that were available during my care of the patient were reviewed by me and considered in my medical decision making.  Assessment & Plan:  Farm was seen today for insomnia.  Diagnoses and all orders for this visit:  Primary insomnia Will trial below to see if beneficial. Sleep hygiene discussed in detail. Aware that CPAP may help with the insomnia. Consider adding Cymbalta for better control of anxiety if not improved at next visit.  -     hydrOXYzine (VISTARIL) 50 MG capsule; Take 1 capsule (50 mg total) by mouth at bedtime as needed and may repeat dose one time if needed (insomnia).  Gastroesophageal reflux disease without esophagitis No red flags, will treat with below. Aware to take for 6 weeks and then taper off slowly. Aware to report any new, worsening, or persistent symptoms.  -     pantoprazole (PROTONIX) 40 MG tablet; Take 1 tablet (40 mg total) by mouth daily.     Continue all other maintenance medications.  Follow up plan: Return in about 4 weeks (around 09/04/2023) for insomnia.   Continue healthy lifestyle choices, including diet (rich in fruits, vegetables, and lean proteins, and low in salt and simple carbohydrates) and exercise (at least 30 minutes of moderate physical activity daily).  Educational handout given for insomnia   The  above assessment and management plan was discussed with the patient. The patient verbalized understanding of and has agreed to the management plan. Patient is aware to call the clinic if they develop any new symptoms or if symptoms persist or worsen. Patient is aware when to return to the clinic for a follow-up visit. Patient educated on when it is appropriate to go to the emergency department.   Kari Baars, FNP-C [facility] [phone]

## 2023-08-09 ENCOUNTER — Ambulatory Visit: Payer: BLUE CROSS/BLUE SHIELD | Admitting: Family Medicine

## 2023-08-14 ENCOUNTER — Ambulatory Visit: Payer: BLUE CROSS/BLUE SHIELD | Admitting: Neurology

## 2023-08-14 DIAGNOSIS — G4719 Other hypersomnia: Secondary | ICD-10-CM

## 2023-08-14 DIAGNOSIS — R519 Headache, unspecified: Secondary | ICD-10-CM

## 2023-08-14 DIAGNOSIS — G4733 Obstructive sleep apnea (adult) (pediatric): Secondary | ICD-10-CM | POA: Diagnosis not present

## 2023-08-14 DIAGNOSIS — G4700 Insomnia, unspecified: Secondary | ICD-10-CM

## 2023-08-14 DIAGNOSIS — Z820 Family history of epilepsy and other diseases of the nervous system: Secondary | ICD-10-CM

## 2023-08-14 DIAGNOSIS — E663 Overweight: Secondary | ICD-10-CM

## 2023-08-14 DIAGNOSIS — R0681 Apnea, not elsewhere classified: Secondary | ICD-10-CM

## 2023-08-14 DIAGNOSIS — R634 Abnormal weight loss: Secondary | ICD-10-CM

## 2023-08-15 ENCOUNTER — Ambulatory Visit: Payer: BLUE CROSS/BLUE SHIELD | Admitting: Allergy

## 2023-08-21 NOTE — Progress Notes (Deleted)
New Patient Note  RE: Olivia Zamora MRN: [id number] DOB: 1971-11-07 Date of Office Visit: 08/22/2023  Consult requested by: Sonny Masters, FNP Primary care provider: Sonny Masters, FNP  Chief Complaint: No chief complaint on file.  History of Present Illness: I had the pleasure of seeing Olivia Zamora for initial evaluation at the [facility] on 08/21/2023. She is a 52 y.o. female, who is referred here by Sonny Masters, FNP for the evaluation of ***.  ***  Assessment and Plan: Olivia Zamora is a 52 y.o. female with: ***  No follow-ups on file.  No orders of the defined types were placed in this encounter.  Lab Orders  No laboratory test(s) ordered today    Other allergy screening: Asthma: {Blank single:19197::"yes","no"} Rhino conjunctivitis: {Blank single:19197::"yes","no"} Food allergy: {Blank single:19197::"yes","no"} Medication allergy: {Blank single:19197::"yes","no"} Hymenoptera allergy: {Blank single:19197::"yes","no"} Urticaria: {Blank single:19197::"yes","no"} Eczema:{Blank single:19197::"yes","no"} History of recurrent infections suggestive of immunodeficency: {Blank single:19197::"yes","no"}  Diagnostics: Spirometry:  Tracings reviewed. Her effort: {Blank single:19197::"Good reproducible efforts.","It was hard to get consistent efforts and there is a question as to whether this reflects a maximal maneuver.","Poor effort, data can not be interpreted."} FVC: ***L FEV1: ***L, ***% predicted FEV1/FVC ratio: ***% Interpretation: {Blank single:19197::"Spirometry consistent with mild obstructive disease","Spirometry consistent with moderate obstructive disease","Spirometry consistent with severe obstructive disease","Spirometry consistent with possible restrictive disease","Spirometry consistent with mixed obstructive and restrictive disease","Spirometry uninterpretable due to technique","Spirometry consistent with normal pattern","No overt  abnormalities noted given today's efforts"}.  Please see scanned spirometry results for details.  Skin Testing: {Blank single:19197::"Select foods","Environmental allergy panel","Environmental allergy panel and select foods","Food allergy panel","None","Deferred due to recent antihistamines use"}. *** Results discussed with patient/family.   Past Medical History: Patient Active Problem List   Diagnosis Date Noted  . Mixed hyperlipidemia 05/09/2023  . Essential hypertension 11/21/2022  . Primary insomnia 11/21/2022  . Controlled substance agreement signed 11/21/2022  . Acquired hypothyroidism 11/21/2022  . AVM (arteriovenous malformation) 03/01/2022  . H/O: CVA (cerebrovascular accident) 08/01/2021  . History of DVT (deep vein thrombosis) 08/01/2021  . Symptomatic mammary hypertrophy 06/13/2021  . IBS (irritable bowel syndrome) 08/26/2019  . Vitamin D deficiency   . Sleep apnea   . Depression   . Anxiety   . Asthma   . Disease of thyroid gland 12/28/2011   Past Medical History:  Diagnosis Date  . Anxiety    Failed therapy with Paxil, Lexparo, Prozac, Wellbutrin, Xanax, and Ativan  . Arthritis   . Asthma   . Cardiac disease    Angina  . Chronic back pain   . Constipation 08/26/2019  . Depression    Failed therapy with Paxil, Lexparo, Prozac, Wellbutrin, and Ativan  . Disease of thyroid gland 12/28/2011   multiple thyroid nodules in 2013 - [place] on 03/08/14 showed heterogeneous appearance of thyroid gland without focal nodule  . Eczema   . Generalized seizure (HCC)    stress related  . GERD (gastroesophageal reflux disease)   . History of DVT (deep vein thrombosis) 1988  . Hypertension   . Hyperthyroidism   . Hypothyroidism   . IBS (irritable bowel syndrome)   . Internal derangement of left knee   . Lactose intolerance   . Migraine   . Sleep apnea    wears CPAP  . Vitamin D deficiency    Past Surgical History: Past Surgical History:  Procedure Laterality Date  .  CESAREAN SECTION     x3  . COLONOSCOPY    . HEMANGIOMA EXCISION Right  shoulder  . IR RADIOLOGIST EVAL & MGMT  05/04/2021  . MASS EXCISION Right    lump from palm of hand  . OOPHORECTOMY    . OTHER SURGICAL HISTORY Right    Repair of tib-fib fracture  . TOTAL ABDOMINAL HYSTERECTOMY    . TOTAL KNEE ARTHROPLASTY Left 06/04/2023   Procedure: LEFT TOTAL KNEE ARTHROPLASTY;  Surgeon: Marcene Corning, MD;  Location: [facility];  Service: Orthopedics;  Laterality: Left;  . TUBAL LIGATION    . TUMOR EXCISION Right    Axilla - benign  . VASCULAR SURGERY Right 1989   blood clot removed from neck   Medication List:  Current Outpatient Medications  Medication Sig Dispense Refill  . albuterol (ACCUNEB) 0.63 MG/3ML nebulizer solution Take 3 mLs (0.63 mg total) by nebulization every 6 (six) hours as needed for wheezing. 120 mL 5  . albuterol (VENTOLIN HFA) 108 (90 Base) MCG/ACT inhaler Inhale 2 puffs into the lungs every 6 (six) hours as needed for wheezing or shortness of breath. 1 each 11  . albuterol (VENTOLIN HFA) 108 (90 Base) MCG/ACT inhaler Inhale 2 puffs into the lungs every 6 (six) hours as needed for wheezing or shortness of breath. 18 g 3  . aspirin EC 81 MG tablet Take 1 tablet (81 mg total) by mouth 2 (two) times daily. For 2 weeks then back to once a day for DVT prevention. 30 tablet 11  . buPROPion (WELLBUTRIN XL) 300 MG 24 hr tablet Take 1 tablet (300 mg total) by mouth daily. 90 tablet 0  . docusate sodium (COLACE) 100 MG capsule Take 1 capsule (100 mg total) by mouth daily. (Patient taking differently: Take 100 mg by mouth 2 (two) times daily.) 90 capsule 1  . fluticasone (FLONASE) 50 MCG/ACT nasal spray Place 1 spray into both nostrils 2 (two) times daily.    . Fremanezumab-vfrm (AJOVY) 225 MG/1.5ML SOAJ Inject 225 mg into the skin every 30 (thirty) days. 1.5 mL 4  . hydrOXYzine (VISTARIL) 50 MG capsule Take 1 capsule (50 mg total) by mouth at bedtime as needed and may repeat dose one  time if needed (insomnia). 90 capsule 0  . linaclotide (LINZESS) 72 MCG capsule Take 1 capsule (72 mcg total) by mouth daily before breakfast. 90 capsule 2  . losartan (COZAAR) 50 MG tablet TAKE 1 TABLET DAILY *CAMBER 90 tablet 0  . Multiple Vitamins-Minerals (MULTIVITAMIN ADULT PO) Take 1 tablet by mouth daily.    . pantoprazole (PROTONIX) 40 MG tablet Take 1 tablet (40 mg total) by mouth daily. 30 tablet 3  . Rimegepant Sulfate (NURTEC) 75 MG TBDP Take 1 tablet (75 mg total) by mouth as needed (migraine). 8 tablet 12  . SYNTHROID 100 MCG tablet Take 100 mcg by mouth every other day.    . Tiotropium Bromide Monohydrate (SPIRIVA RESPIMAT) 1.25 MCG/ACT AERS Inhale 2 puffs into the lungs daily. (Patient taking differently: Inhale 2 puffs into the lungs 2 (two) times daily.) 4 g 11  . Vitamin D, Ergocalciferol, (DRISDOL) 1.25 MG (50000 UT) CAPS capsule Take 50,000 Units by mouth every 7 (seven) days.     No current facility-administered medications for this visit.   Allergies: Allergies  Allergen Reactions  . Iodine Anaphylaxis  . Iohexol Anaphylaxis    Pre meds given in past with no reaction noted.    . Latex Anaphylaxis and Other (See Comments)  . Topamax [Topiramate] Anaphylaxis  . Zanaflex [Tizanidine] Shortness Of Breath    Swelling, chest pain  Social History: Social History   Socioeconomic History  . Marital status: Married    Spouse name: Not on file  . Number of children: Not on file  . Years of education: Not on file  . Highest education level: Associate degree: occupational, Scientist, product/process development, or vocational program  Occupational History  . Not on file  Tobacco Use  . Smoking status: Former    Types: Cigarettes  . Smokeless tobacco: Never  Substance and Sexual Activity  . Alcohol use: Never  . Drug use: Never  . Sexual activity: Not on file  Other Topics Concern  . Not on file  Social History Narrative  . Not on file   Social Determinants of Health   Financial  Resource Strain: Low Risk  (05/07/2023)   Overall Financial Resource Strain (CARDIA)   . Difficulty of Paying Living Expenses: Not hard at all  Food Insecurity: No Food Insecurity (05/07/2023)   Hunger Vital Sign   . Worried About Programme researcher, broadcasting/film/video in the Last Year: Never true   . Ran Out of Food in the Last Year: Never true  Transportation Needs: No Transportation Needs (05/07/2023)   PRAPARE - Transportation   . Lack of Transportation (Medical): No   . Lack of Transportation (Non-Medical): No  Physical Activity: Sufficiently Active (05/07/2023)   Exercise Vital Sign   . Days of Exercise per Week: 5 days   . Minutes of Exercise per Session: 30 min  Stress: No Stress Concern Present (05/07/2023)   [employer] of Occupational Health - Occupational Stress Questionnaire   . Feeling of Stress : Not at all  Social Connections: Moderately Integrated (05/07/2023)   Social Connection and Isolation Panel [NHANES]   . Frequency of Communication with Friends and Family: More than three times a week   . Frequency of Social Gatherings with Friends and Family: Once a week   . Attends Religious Services: 1 to 4 times per year   . Active Member of Clubs or Organizations: No   . Attends Banker Meetings: Not on file   . Marital Status: Married   Lives in a ***. Smoking: *** Occupation: ***  Environmental HistorySurveyor, minerals in the house: Copywriter, advertising in the family room: {Blank single:19197::"yes","no"} Carpet in the bedroom: {Blank single:19197::"yes","no"} Heating: {Blank single:19197::"electric","gas","heat pump"} Cooling: {Blank single:19197::"central","window","heat pump"} Pet: {Blank single:19197::"yes ***","no"}  Family History: Family History  Problem Relation Age of Onset  . Other Niece        antiphospholipid AB syndrome  . Factor V Leiden deficiency Sister   . CVA Sister        30s  . Thyroid disease Sister   . Breast cancer  Sister   . Multiple sclerosis Sister   . Other Sister        guillain barre  . Depression Sister   . Migraines Sister   . Arthritis Mother   . Hypertension Mother   . Thyroid disease Mother   . Depression Mother   . Migraines Mother   . Arthritis Father   . Hypertension Father   . Depression Father   . Hypertension Maternal Grandmother   . Depression Maternal Grandmother   . Anxiety disorder Maternal Grandmother   . Hypertension Maternal Grandfather   . Hypertension Paternal Grandmother   . Hypertension Paternal Grandfather   . CVA Sister   . Thyroid disease Sister   . Depression Sister   . Migraines Sister   . Asthma Daughter   . Depression Daughter   .  Anxiety disorder Daughter   . Irritable bowel syndrome Daughter   . Lactose intolerance Daughter   . Migraines Daughter   . Asthma Son   . Depression Son   . Anxiety disorder Son   . Irritable bowel syndrome Son   . Lactose intolerance Son   . Migraines Son   . Asthma Son   . Depression Son   . Anxiety disorder Son   . Irritable bowel syndrome Son   . Lactose intolerance Son   . Migraines Son    Problem                               Relation Asthma                                   *** Eczema                                *** Food allergy                          *** Allergic rhino conjunctivitis     ***  Review of Systems  Constitutional:  Negative for appetite change, chills, fever and unexpected weight change.  HENT:  Negative for congestion and rhinorrhea.   Eyes:  Negative for itching.  Respiratory:  Negative for cough, chest tightness, shortness of breath and wheezing.   Cardiovascular:  Negative for chest pain.  Gastrointestinal:  Negative for abdominal pain.  Genitourinary:  Negative for difficulty urinating.  Skin:  Negative for rash.  Neurological:  Negative for headaches.   Objective: There were no vitals taken for this visit. There is no height or weight on file to calculate BMI. Physical  Exam Vitals and nursing note reviewed.  Constitutional:      Appearance: Normal appearance. She is well-developed.  HENT:     Head: Normocephalic and atraumatic.     Right Ear: Tympanic membrane and external ear normal.     Left Ear: Tympanic membrane and external ear normal.     Nose: Nose normal.     Mouth/Throat:     Mouth: Mucous membranes are moist.     Pharynx: Oropharynx is clear.  Eyes:     Conjunctiva/sclera: Conjunctivae normal.  Cardiovascular:     Rate and Rhythm: Normal rate and regular rhythm.     Heart sounds: Normal heart sounds. No murmur heard.    No friction rub. No gallop.  Pulmonary:     Effort: Pulmonary effort is normal.     Breath sounds: Normal breath sounds. No wheezing, rhonchi or rales.  Musculoskeletal:     Cervical back: Neck supple.  Skin:    General: Skin is warm.     Findings: No rash.  Neurological:     Mental Status: She is alert and oriented to person, place, and time.  Psychiatric:        Behavior: Behavior normal.  The plan was reviewed with the patient/family, and all questions/concerned were addressed.  It was my pleasure to see Malyia today and participate in her care. Please feel free to contact me with any questions or concerns.  Sincerely,  Wyline Mood, DO Allergy & Immunology  [facility] office: [phone] [facility] office: [phone]

## 2023-08-22 ENCOUNTER — Ambulatory Visit: Payer: BLUE CROSS/BLUE SHIELD | Admitting: Family Medicine

## 2023-08-22 ENCOUNTER — Ambulatory Visit: Payer: BLUE CROSS/BLUE SHIELD | Admitting: Allergy

## 2023-08-22 NOTE — Progress Notes (Signed)
See procedure note.

## 2023-08-22 NOTE — Procedures (Signed)
   [facility]  HOME SLEEP TEST (Watch PAT) REPORT - Mail-out Device  STUDY DATE: 08/19/2023  DOB: 12-12-1970  MRN: [id number]  ORDERING CLINICIAN: Huston Foley, MD, PhD   REFERRING CLINICIAN: Sonny Masters, FNP   CLINICAL INFORMATION/HISTORY: 52 year old female with an underlying medical history of hypertension, thyroid disease, irritable bowel syndrome, vitamin D deficiency, chronic back pain, depression, anxiety, arthritis, asthma, eczema, reflux disease, history of DVT, migraine headaches, and overweight state, who was previously diagnosed with obstructive sleep apnea and placed on PAP therapy. She reports snoring and apneas as well as difficulty falling asleep and staying asleep. She no longer uses her PAP machine as it was recalled. She has not used her machine in over 2 years.  She presents for reevaluation.  She has lost quite significant amount of weight over time.  Epworth sleepiness score: 12/24.  BMI: 29.1 kg/m  FINDINGS:   Sleep Summary:   Total Recording Time (hours, min): 9 hours, 55 min  Total Sleep Time (hours, min):  8 hours, 29 min  Percent REM (%):    18.7%   Respiratory Indices:   Calculated pAHI (per hour):  9.7/hour         REM pAHI:    14.9/hour       NREM pAHI: 8.5/hour  Central pAHI: 1.9/hour  Oxygen Saturation Statistics:    Oxygen Saturation (%) Mean: 96%   Minimum oxygen saturation (%):                 90%   O2 Saturation Range (%): 90 - 100%    O2 Saturation (minutes) <=88%: 0 min  Pulse Rate Statistics:   Pulse Mean (bpm):    75/min    Pulse Range (53 - 105/min)   IMPRESSION: OSA (obstructive sleep apnea), mild  RECOMMENDATION:  This HST shows rather mild obstructive sleep apnea - by number of events - with an AHI of 9.7/h, O2 nadir 90%.  Mild to moderate snoring was detected.  Treatment options include positive airway pressure with autoPAP, if desired by patient. Treatment options otherwise include some  degree weight loss and avoidance of the supine sleep position or a dental device. These different avenues will be discussed with the patient. The patient will be seen in follow up in sleep clinic, if necessary. Please note, that other causes of the patient's symptoms, including circadian rhythm disturbances, an underlying mood disorder, medication effect and/or an underlying medical problem cannot be ruled out based on this test. Clinical correlation is recommended. The patient should be cautioned not to drive, work at heights, or operate dangerous or heavy equipment when tired or sleepy. Review and reiteration of good sleep hygiene measures should be pursued with any patient. The referring provider will be notified of the test results.   I certify that I have reviewed the raw data recording prior to the issuance of this report in accordance with the standards of the American Academy of Sleep Medicine (AASM).  INTERPRETING PHYSICIAN:   Huston Foley, MD, PhD Medical Director, [facility]) Diplomat, ABPN (Neurology and Sleep)   [facility] [address], [address] [place], [place] [address] [phone]

## 2023-08-23 ENCOUNTER — Ambulatory Visit: Payer: BLUE CROSS/BLUE SHIELD | Admitting: Family Medicine

## 2023-08-23 ENCOUNTER — Encounter: Payer: Self-pay | Admitting: Neurology

## 2023-08-23 DIAGNOSIS — G4733 Obstructive sleep apnea (adult) (pediatric): Secondary | ICD-10-CM

## 2023-08-26 NOTE — Telephone Encounter (Signed)
  Hi, Colene: Your recent home sleep test showed rather mild obstructive sleep apnea.  You did not have any significant oxygen desaturations, in fact, your oxygen level remained at or above 90% for the night.  You did have mild to moderate snoring.  Overall apnea-hypopnea score was 9.7/h which is in the mild range (normal is less than 5/h).  Treatment options include AutoPap therapy such as your previous machine, or ongoing weight loss and avoiding sleeping on your back or an oral appliance through dentistry.  Let me know how you would like to proceed.  If you would like to get restarted on AutoPap therapy, I would be happy to write for a machine.  If you would like a referral to dentistry to consider an oral appliance I would be happy to place a referral.  You can call us back or email [place] through MyChart. Dr. Frances Furbish  Written by Huston Foley, MD on 08/22/2023  5:30 PM EDT Seen by patient [name] on 08/23/2023  4:41 PM

## 2023-08-26 NOTE — Telephone Encounter (Signed)
New autoPAP order placed, please send to DME of choice.  She will need a FU in sleep clinic to see NP or MD in 2-3 mo post set-up date.

## 2023-08-29 ENCOUNTER — Other Ambulatory Visit: Payer: Self-pay | Admitting: Family Medicine

## 2023-08-29 DIAGNOSIS — F5101 Primary insomnia: Secondary | ICD-10-CM

## 2023-08-29 NOTE — Telephone Encounter (Signed)
Faxed over orders to [facility] this afternoon

## 2023-09-02 ENCOUNTER — Encounter: Payer: Self-pay | Admitting: Family Medicine

## 2023-09-02 NOTE — Progress Notes (Unsigned)
New Patient Note  RE: Olivia Zamora MRN: [id number] DOB: [birth year] Date of Office Visit: 09/03/2023  Consult requested by: Sonny Masters, FNP Primary care provider: Sonny Masters, FNP  Chief Complaint: No chief complaint on file.  History of Present Illness: I had the pleasure of seeing Olivia Zamora for initial evaluation at the [facility] on 09/02/2023. She is a 52 y.o. female, who is referred here by Sonny Masters, FNP for the evaluation of ***.  Discussed the use of AI scribe software for clinical note transcription with the patient, who gave verbal consent to proceed.  History of Present Illness             ***  Assessment and Plan: [name] is a 52 y.o. female with: ***  Assessment and Plan               No follow-ups on file.  No orders of the defined types were placed in this encounter.  Lab Orders  No laboratory test(s) ordered today    Other allergy screening: Asthma: {Blank single:19197::"yes","no"} Rhino conjunctivitis: {Blank single:19197::"yes","no"} Food allergy: {Blank single:19197::"yes","no"} Medication allergy: {Blank single:19197::"yes","no"} Hymenoptera allergy: {Blank single:19197::"yes","no"} Urticaria: {Blank single:19197::"yes","no"} Eczema:{Blank single:19197::"yes","no"} History of recurrent infections suggestive of immunodeficency: {Blank single:19197::"yes","no"}  Diagnostics: Spirometry:  Tracings reviewed. Her effort: {Blank single:19197::"Good reproducible efforts.","It was hard to get consistent efforts and there is a question as to whether this reflects a maximal maneuver.","Poor effort, data can not be interpreted."} FVC: ***L FEV1: ***L, ***% predicted FEV1/FVC ratio: ***% Interpretation: {Blank single:19197::"Spirometry consistent with mild obstructive disease","Spirometry consistent with moderate obstructive disease","Spirometry consistent with severe obstructive disease","Spirometry consistent  with possible restrictive disease","Spirometry consistent with mixed obstructive and restrictive disease","Spirometry uninterpretable due to technique","Spirometry consistent with normal pattern","No overt abnormalities noted given today's efforts"}.  Please see scanned spirometry results for details.  Skin Testing: {Blank single:19197::"Select foods","Environmental allergy panel","Environmental allergy panel and select foods","Food allergy panel","None","Deferred due to recent antihistamines use"}. *** Results discussed with patient/family.   Past Medical History: Patient Active Problem List   Diagnosis Date Noted  . Mixed hyperlipidemia 05/09/2023  . Essential hypertension 11/21/2022  . Primary insomnia 11/21/2022  . Controlled substance agreement signed 11/21/2022  . Acquired hypothyroidism 11/21/2022  . AVM (arteriovenous malformation) 03/01/2022  . H/O: CVA (cerebrovascular accident) 08/01/2021  . History of DVT (deep vein thrombosis) 08/01/2021  . Symptomatic mammary hypertrophy 06/13/2021  . IBS (irritable bowel syndrome) 08/26/2019  . Vitamin D deficiency   . Sleep apnea   . Depression   . Anxiety   . Asthma   . Disease of thyroid gland 12/28/2011   Past Medical History:  Diagnosis Date  . Anxiety    Failed therapy with Paxil, Lexparo, Prozac, Wellbutrin, Xanax, and Ativan  . Arthritis   . Asthma   . Cardiac disease    Angina  . Chronic back pain   . Constipation 08/26/2019  . Depression    Failed therapy with Paxil, Lexparo, Prozac, Wellbutrin, and Ativan  . Disease of thyroid gland 12/28/2011   multiple thyroid nodules in 2013 - [place] on 03/08/14 showed heterogeneous appearance of thyroid gland without focal nodule  . Eczema   . Generalized seizure (HCC)    stress related  . GERD (gastroesophageal reflux disease)   . History of DVT (deep vein thrombosis) 1988  . Hypertension   . Hyperthyroidism   . Hypothyroidism   . IBS (irritable bowel syndrome)   . Internal  derangement of left knee   .  Lactose intolerance   . Migraine   . Sleep apnea    wears CPAP  . Vitamin D deficiency    Past Surgical History: Past Surgical History:  Procedure Laterality Date  . CESAREAN SECTION     x3  . COLONOSCOPY    . HEMANGIOMA EXCISION Right    shoulder  . IR RADIOLOGIST EVAL & MGMT  05/04/2021  . MASS EXCISION Right    lump from palm of hand  . OOPHORECTOMY    . OTHER SURGICAL HISTORY Right    Repair of tib-fib fracture  . TOTAL ABDOMINAL HYSTERECTOMY    . TOTAL KNEE ARTHROPLASTY Left 06/04/2023   Procedure: LEFT TOTAL KNEE ARTHROPLASTY;  Surgeon: Marcene Corning, MD;  Location: [facility];  Service: Orthopedics;  Laterality: Left;  . TUBAL LIGATION    . TUMOR EXCISION Right    Axilla - benign  . VASCULAR SURGERY Right 1989   blood clot removed from neck   Medication List:  Current Outpatient Medications  Medication Sig Dispense Refill  . albuterol (ACCUNEB) 0.63 MG/3ML nebulizer solution Take 3 mLs (0.63 mg total) by nebulization every 6 (six) hours as needed for wheezing. 120 mL 5  . albuterol (VENTOLIN HFA) 108 (90 Base) MCG/ACT inhaler Inhale 2 puffs into the lungs every 6 (six) hours as needed for wheezing or shortness of breath. 1 each 11  . albuterol (VENTOLIN HFA) 108 (90 Base) MCG/ACT inhaler Inhale 2 puffs into the lungs every 6 (six) hours as needed for wheezing or shortness of breath. 18 g 3  . aspirin EC 81 MG tablet Take 1 tablet (81 mg total) by mouth 2 (two) times daily. For 2 weeks then back to once a day for DVT prevention. 30 tablet 11  . buPROPion (WELLBUTRIN XL) 300 MG 24 hr tablet Take 1 tablet (300 mg total) by mouth daily. 90 tablet 0  . docusate sodium (COLACE) 100 MG capsule Take 1 capsule (100 mg total) by mouth daily. (Patient taking differently: Take 100 mg by mouth 2 (two) times daily.) 90 capsule 1  . fluticasone (FLONASE) 50 MCG/ACT nasal spray Place 1 spray into both nostrils 2 (two) times daily.    . Fremanezumab-vfrm (AJOVY)  225 MG/1.5ML SOAJ Inject 225 mg into the skin every 30 (thirty) days. 1.5 mL 4  . hydrOXYzine (VISTARIL) 50 MG capsule TAKE 1 CAPSULE BY MOUTH AT BEDTIME AS NEEDED- MAY REPEAT 1 CAPSULE IF NEEDED FOR INSOMNIA 180 capsule 0  . linaclotide (LINZESS) 72 MCG capsule Take 1 capsule (72 mcg total) by mouth daily before breakfast. 90 capsule 2  . losartan (COZAAR) 50 MG tablet TAKE 1 TABLET DAILY *CAMBER 90 tablet 0  . Multiple Vitamins-Minerals (MULTIVITAMIN ADULT PO) Take 1 tablet by mouth daily.    . pantoprazole (PROTONIX) 40 MG tablet Take 1 tablet (40 mg total) by mouth daily. 30 tablet 3  . Rimegepant Sulfate (NURTEC) 75 MG TBDP Take 1 tablet (75 mg total) by mouth as needed (migraine). 8 tablet 12  . SYNTHROID 100 MCG tablet Take 100 mcg by mouth every other day.    . Tiotropium Bromide Monohydrate (SPIRIVA RESPIMAT) 1.25 MCG/ACT AERS Inhale 2 puffs into the lungs daily. (Patient taking differently: Inhale 2 puffs into the lungs 2 (two) times daily.) 4 g 11  . Vitamin D, Ergocalciferol, (DRISDOL) 1.25 MG (50000 UT) CAPS capsule Take 50,000 Units by mouth every 7 (seven) days.     No current facility-administered medications for this visit.   Allergies: Allergies  Allergen  Reactions  . Iodine Anaphylaxis  . Iohexol Anaphylaxis    Pre meds given in past with no reaction noted.    . Latex Anaphylaxis and Other (See Comments)  . Topamax [Topiramate] Anaphylaxis  . Zanaflex [Tizanidine] Shortness Of Breath    Swelling, chest pain    Social History: Social History   Socioeconomic History  . Marital status: Married    Spouse name: Not on file  . Number of children: Not on file  . Years of education: Not on file  . Highest education level: Associate degree: occupational, Scientist, product/process development, or vocational program  Occupational History  . Not on file  Tobacco Use  . Smoking status: Former    Types: Cigarettes  . Smokeless tobacco: Never  Substance and Sexual Activity  . Alcohol use: Never  .  Drug use: Never  . Sexual activity: Not on file  Other Topics Concern  . Not on file  Social History Narrative  . Not on file   Social Determinants of Health   Financial Resource Strain: Low Risk  (05/07/2023)   Overall Financial Resource Strain (CARDIA)   . Difficulty of Paying Living Expenses: Not hard at all  Food Insecurity: No Food Insecurity (05/07/2023)   Hunger Vital Sign   . Worried About Programme researcher, broadcasting/film/video in the Last Year: Never true   . Ran Out of Food in the Last Year: Never true  Transportation Needs: No Transportation Needs (05/07/2023)   PRAPARE - Transportation   . Lack of Transportation (Medical): No   . Lack of Transportation (Non-Medical): No  Physical Activity: Sufficiently Active (05/07/2023)   Exercise Vital Sign   . Days of Exercise per Week: 5 days   . Minutes of Exercise per Session: 30 min  Stress: No Stress Concern Present (05/07/2023)   [employer] of Occupational Health - Occupational Stress Questionnaire   . Feeling of Stress : Not at all  Social Connections: Moderately Integrated (05/07/2023)   Social Connection and Isolation Panel [NHANES]   . Frequency of Communication with Friends and Family: More than three times a week   . Frequency of Social Gatherings with Friends and Family: Once a week   . Attends Religious Services: 1 to 4 times per year   . Active Member of Clubs or Organizations: No   . Attends Banker Meetings: Not on file   . Marital Status: Married   Lives in a ***. Smoking: *** Occupation: ***  Environmental HistorySurveyor, minerals in the house: Copywriter, advertising in the family room: {Blank single:19197::"yes","no"} Carpet in the bedroom: {Blank single:19197::"yes","no"} Heating: {Blank single:19197::"electric","gas","heat pump"} Cooling: {Blank single:19197::"central","window","heat pump"} Pet: {Blank single:19197::"yes ***","no"}  Family History: Family History  Problem Relation  Age of Onset  . Other Niece        antiphospholipid AB syndrome  . Factor V Leiden deficiency Sister   . CVA Sister        30s  . Thyroid disease Sister   . Breast cancer Sister   . Multiple sclerosis Sister   . Other Sister        guillain barre  . Depression Sister   . Migraines Sister   . Arthritis Mother   . Hypertension Mother   . Thyroid disease Mother   . Depression Mother   . Migraines Mother   . Arthritis Father   . Hypertension Father   . Depression Father   . Hypertension Maternal Grandmother   . Depression Maternal Grandmother   . Anxiety  disorder Maternal Grandmother   . Hypertension Maternal Grandfather   . Hypertension Paternal Grandmother   . Hypertension Paternal Grandfather   . CVA Sister   . Thyroid disease Sister   . Depression Sister   . Migraines Sister   . Asthma Daughter   . Depression Daughter   . Anxiety disorder Daughter   . Irritable bowel syndrome Daughter   . Lactose intolerance Daughter   . Migraines Daughter   . Asthma Son   . Depression Son   . Anxiety disorder Son   . Irritable bowel syndrome Son   . Lactose intolerance Son   . Migraines Son   . Asthma Son   . Depression Son   . Anxiety disorder Son   . Irritable bowel syndrome Son   . Lactose intolerance Son   . Migraines Son    Problem                               Relation Asthma                                   *** Eczema                                *** Food allergy                          *** Allergic rhino conjunctivitis     ***  Review of Systems  Constitutional:  Negative for appetite change, chills, fever and unexpected weight change.  HENT:  Negative for congestion and rhinorrhea.   Eyes:  Negative for itching.  Respiratory:  Negative for cough, chest tightness, shortness of breath and wheezing.   Cardiovascular:  Negative for chest pain.  Gastrointestinal:  Negative for abdominal pain.  Genitourinary:  Negative for difficulty urinating.  Skin:  Negative  for rash.  Neurological:  Negative for headaches.   Objective: There were no vitals taken for this visit. There is no height or weight on file to calculate BMI. Physical Exam Vitals and nursing note reviewed.  Constitutional:      Appearance: Normal appearance. She is well-developed.  HENT:     Head: Normocephalic and atraumatic.     Right Ear: Tympanic membrane and external ear normal.     Left Ear: Tympanic membrane and external ear normal.     Nose: Nose normal.     Mouth/Throat:     Mouth: Mucous membranes are moist.     Pharynx: Oropharynx is clear.  Eyes:     Conjunctiva/sclera: Conjunctivae normal.  Cardiovascular:     Rate and Rhythm: Normal rate and regular rhythm.     Heart sounds: Normal heart sounds. No murmur heard.    No friction rub. No gallop.  Pulmonary:     Effort: Pulmonary effort is normal.     Breath sounds: Normal breath sounds. No wheezing, rhonchi or rales.  Musculoskeletal:     Cervical back: Neck supple.  Skin:    General: Skin is warm.     Findings: No rash.  Neurological:     Mental Status: She is alert and oriented to person, place, and time.  Psychiatric:        Behavior: Behavior normal.  The plan was reviewed with the  patient/family, and all questions/concerned were addressed.  It was my pleasure to see Gifty today and participate in her care. Please feel free to contact me with any questions or concerns.  Sincerely,  Wyline Mood, DO Allergy & Immunology  [facility] office: [phone] [facility] office: [phone]

## 2023-09-03 ENCOUNTER — Ambulatory Visit (INDEPENDENT_AMBULATORY_CARE_PROVIDER_SITE_OTHER): Payer: BLUE CROSS/BLUE SHIELD | Admitting: Allergy

## 2023-09-03 ENCOUNTER — Telehealth: Payer: Self-pay

## 2023-09-03 ENCOUNTER — Encounter: Payer: Self-pay | Admitting: Allergy

## 2023-09-03 ENCOUNTER — Encounter: Payer: Self-pay | Admitting: Family Medicine

## 2023-09-03 ENCOUNTER — Other Ambulatory Visit: Payer: Self-pay | Admitting: Allergy

## 2023-09-03 VITALS — BP 110/80 | HR 85 | Temp 97.8°F | Resp 18 | Ht 67.25 in | Wt 180.8 lb

## 2023-09-03 DIAGNOSIS — R198 Other specified symptoms and signs involving the digestive system and abdomen: Secondary | ICD-10-CM | POA: Diagnosis not present

## 2023-09-03 DIAGNOSIS — J4540 Moderate persistent asthma, uncomplicated: Secondary | ICD-10-CM

## 2023-09-03 DIAGNOSIS — T781XXD Other adverse food reactions, not elsewhere classified, subsequent encounter: Secondary | ICD-10-CM

## 2023-09-03 DIAGNOSIS — K219 Gastro-esophageal reflux disease without esophagitis: Secondary | ICD-10-CM | POA: Diagnosis not present

## 2023-09-03 DIAGNOSIS — R21 Rash and other nonspecific skin eruption: Secondary | ICD-10-CM | POA: Diagnosis not present

## 2023-09-03 DIAGNOSIS — J3089 Other allergic rhinitis: Secondary | ICD-10-CM

## 2023-09-03 MED ORDER — BUDESONIDE 0.5 MG/2ML IN SUSP: 0.5000 mg | Freq: Two times a day (BID) | RESPIRATORY_TRACT | 2 refills | Status: DC

## 2023-09-03 MED ORDER — FLUTICASONE PROPIONATE 50 MCG/ACT NA SUSP: 1.0000 | Freq: Every day | NASAL | 3 refills | Status: DC | PRN

## 2023-09-03 MED ORDER — LEVOCETIRIZINE DIHYDROCHLORIDE 5 MG PO TABS: 5.0000 mg | ORAL_TABLET | Freq: Every evening | ORAL | 3 refills | Status: DC

## 2023-09-03 MED ORDER — LEVALBUTEROL HCL 0.63 MG/3ML IN NEBU: 0.6300 mg | INHALATION_SOLUTION | RESPIRATORY_TRACT | 1 refills | Status: DC | PRN

## 2023-09-03 MED ORDER — LEVALBUTEROL TARTRATE 45 MCG/ACT IN AERO: 2.0000 | INHALATION_SPRAY | RESPIRATORY_TRACT | 2 refills | Status: DC | PRN

## 2023-09-03 NOTE — Patient Instructions (Addendum)
Asthma  Daily controller medication(s): start Pulmicort (budesonide) 0.5mg  nebulizer twice a day. Stop Spiriva.  May use levoalbuterol rescue inhaler 2 puffs or nebulizer every 4 to 6 hours as needed for shortness of breath, chest tightness, coughing, and wheezing.  Monitor frequency of use - if you need to use it more than twice per week on a consistent basis let us know.  Breathing control goals:  Full participation in all desired activities (may need albuterol before activity) Albuterol use two times or less a week on average (not counting use with activity) Cough interfering with sleep two times or less a month Oral steroids no more than once a year No hospitalizations   Rhinitis Start Xyzal (levocetirizine) 5mg  daily.  Use Flonase (fluticasone) nasal spray 1-2 sprays per nostril once a day as needed for nasal congestion.  Nasal saline spray (i.e., Simply Saline) or nasal saline lavage (i.e., NeilMed) is recommended as needed and prior to medicated nasal sprays. Skin testing at next visit.   Foods Avoid the foods that give you issues. Skin testing at next visit.  For mild symptoms you can take over the counter antihistamines such as Benadryl 1-2 tablets = 25-50mg  and monitor symptoms closely.  If symptoms worsen or if you have severe symptoms including breathing issues, throat closure, significant swelling, whole body hives, severe diarrhea and vomiting, lightheadedness then seek immediate medical care.  Rash Keep track of episodes and take pictures. Write down what you had eaten/done that day. May need bloodwork.  See below for proper skin care.   GI symptoms Recommend referral to GI.  See handout for lifestyle and dietary modifications. Continue Protonix 40mg  once day - nothing to eat or drink for 20-30 minutes afterwards.   Follow up in 4 weeks.   Skin care recommendations  Bath time: Always use lukewarm water. AVOID very hot or cold water. Keep bathing time to 5-10  minutes. Do NOT use bubble bath. Use a mild soap and use just enough to wash the dirty areas. Do NOT scrub skin vigorously.  After bathing, pat dry your skin with a towel. Do NOT rub or scrub the skin.  Moisturizers and prescriptions:  ALWAYS apply moisturizers immediately after bathing (within 3 minutes). This helps to lock-in moisture. Use the moisturizer several times a day over the whole body. Good summer moisturizers include: Aveeno, CeraVe, Cetaphil. Good winter moisturizers include: Aquaphor, Vaseline, Cerave, Cetaphil, Eucerin, Vanicream. When using moisturizers along with medications, the moisturizer should be applied about one hour after applying the medication to prevent diluting effect of the medication or moisturize around where you applied the medications. When not using medications, the moisturizer can be continued twice daily as maintenance.  Laundry and clothing: Avoid laundry products with added color or perfumes. Use unscented hypo-allergenic laundry products such as Tide free, Cheer free & gentle, and All free and clear.  If the skin still seems dry or sensitive, you can try double-rinsing the clothes. Avoid tight or scratchy clothing such as wool. Do not use fabric softeners or dyer sheets.

## 2023-09-03 NOTE — Telephone Encounter (Signed)
Per Dr.Kim please refer to GI for IBS

## 2023-09-04 NOTE — Telephone Encounter (Signed)
Referral has been placed to [facility]. Patient has been updated via MyChart.

## 2023-09-06 ENCOUNTER — Encounter: Payer: Self-pay | Admitting: Allergy

## 2023-09-06 ENCOUNTER — Telehealth: Payer: Self-pay | Admitting: Allergy

## 2023-09-06 ENCOUNTER — Other Ambulatory Visit: Payer: Self-pay | Admitting: Family Medicine

## 2023-09-06 ENCOUNTER — Other Ambulatory Visit: Payer: Self-pay | Admitting: *Deleted

## 2023-09-06 DIAGNOSIS — I10 Essential (primary) hypertension: Secondary | ICD-10-CM

## 2023-09-06 DIAGNOSIS — Z0000 Encounter for general adult medical examination without abnormal findings: Secondary | ICD-10-CM

## 2023-09-06 NOTE — Telephone Encounter (Signed)
Is it okay to give nebulizer?

## 2023-09-06 NOTE — Telephone Encounter (Signed)
Patient is calling asking for a Nebulizer please advise she states the one she had at home is not working anymore. A good call back number is [phone] .

## 2023-09-06 NOTE — Telephone Encounter (Signed)
Lm for pt to call us back about this looked in [place] dosent look like she has been given a nebulizer by [place]

## 2023-09-08 NOTE — Telephone Encounter (Signed)
I let staff know that okay to give neb machine.

## 2023-09-09 NOTE — Telephone Encounter (Signed)
Patient's husband called requesting  to pick up nebulizer in [place] office today. I checked [address] and did not see nebulizer for patient. Husband can pick it up after lunch today, 09-09-2023.

## 2023-09-10 ENCOUNTER — Encounter: Payer: Self-pay | Admitting: Family Medicine

## 2023-09-10 ENCOUNTER — Ambulatory Visit (INDEPENDENT_AMBULATORY_CARE_PROVIDER_SITE_OTHER): Payer: BLUE CROSS/BLUE SHIELD | Admitting: Family Medicine

## 2023-09-10 ENCOUNTER — Ambulatory Visit: Payer: BLUE CROSS/BLUE SHIELD | Admitting: Family Medicine

## 2023-09-10 VITALS — BP 102/66 | HR 71 | Temp 97.4°F | Ht 67.25 in | Wt 179.2 lb

## 2023-09-10 DIAGNOSIS — R5383 Other fatigue: Secondary | ICD-10-CM

## 2023-09-10 DIAGNOSIS — E559 Vitamin D deficiency, unspecified: Secondary | ICD-10-CM | POA: Diagnosis not present

## 2023-09-10 DIAGNOSIS — I10 Essential (primary) hypertension: Secondary | ICD-10-CM

## 2023-09-10 DIAGNOSIS — R3589 Other polyuria: Secondary | ICD-10-CM | POA: Diagnosis not present

## 2023-09-10 DIAGNOSIS — F5101 Primary insomnia: Secondary | ICD-10-CM | POA: Diagnosis not present

## 2023-09-10 DIAGNOSIS — F419 Anxiety disorder, unspecified: Secondary | ICD-10-CM

## 2023-09-10 DIAGNOSIS — F332 Major depressive disorder, recurrent severe without psychotic features: Secondary | ICD-10-CM

## 2023-09-10 LAB — BAYER DCA HB A1C WAIVED: HB A1C (BAYER DCA - WAIVED): 5.3 % (ref 4.8–5.6)

## 2023-09-10 LAB — URINALYSIS, ROUTINE W REFLEX MICROSCOPIC
Bilirubin, UA: NEGATIVE
Glucose, UA: NEGATIVE
Ketones, UA: NEGATIVE
Leukocytes,UA: NEGATIVE
Nitrite, UA: NEGATIVE
Protein,UA: NEGATIVE
Specific Gravity, UA: >1.03 — ABNORMAL HIGH (ref 1.005–1.030)
Urobilinogen, Ur: 0.2 mg/dL (ref 0.2–1.0)
pH, UA: 5.5 (ref 5.0–7.5)

## 2023-09-10 LAB — MICROSCOPIC EXAMINATION
RBC, Urine: NONE SEEN /[HPF] (ref 0–2)
Renal Epithel, UA: NONE SEEN /[HPF]
Yeast, UA: NONE SEEN

## 2023-09-10 MED ORDER — DESVENLAFAXINE SUCCINATE ER 25 MG PO TB24
25.0000 mg | ORAL_TABLET | Freq: Every day | ORAL | 6 refills | Status: DC
Start: 2023-09-10 — End: 2023-10-02

## 2023-09-10 NOTE — Progress Notes (Signed)
Subjective:  [name], female    DOB: 11/29/1971, 52 y.o.   MRN: [id number]  Patient Care Team: Sonny Masters, FNP as PCP - General (Family Medicine)   Chief Complaint:  Insomnia (Vistaril is not working . )   HPI: Olivia Zamora is a 52 y.o. female presenting on 09/10/2023 for Insomnia (Vistaril is not working . )    1. Primary insomnia She has tried and failed several medications, most recent being Vistaril. She reports her thoughts continue to race and she is unable to go to sleep and stay asleep. Reports she has been more anxious and her moods have been all over the place for the last several months. She has not received her CPAP but is scheduled to see them next week. I feel a great deal of her insomnia is due to uncontrolled anxiety and depression.     09/10/2023   11:29 AM 08/07/2023   10:44 AM 07/02/2023   11:38 AM 05/09/2023   11:01 AM 11/21/2022   10:05 AM  Depression screen PHQ 2/9  Decreased Interest 1 1 0 0 1  Down, Depressed, Hopeless 1 1 0 0 1  PHQ - 2 Score 2 2 0 0 2  Altered sleeping 3 3 3  0 1  Tired, decreased energy 3 2 2  0 2  Change in appetite 2 1 1  0 1  Feeling bad or failure about yourself  0 0 0 0 0  Trouble concentrating 2 0 0 0 1  Moving slowly or fidgety/restless 1 0 0 0 1  Suicidal thoughts 0 0 0 0 0  PHQ-9 Score 13 8 6  0 8  Difficult doing work/chores Very difficult Not difficult at all Somewhat difficult Not difficult at all Somewhat difficult      09/10/2023   11:29 AM 08/07/2023   10:44 AM 07/02/2023   11:38 AM 05/09/2023   11:01 AM  GAD 7 : Generalized Anxiety Score  Nervous, Anxious, on Edge 3 0 0 0  Control/stop worrying 0 0 0 0  Worry too much - different things 3 0 0 0  Trouble relaxing 2 0 0 0  Restless 1 0 1 0  Easily annoyed or irritable 3 1 0 0  Afraid - awful might happen 0 0 0 0  Total GAD 7 Score 12 1 1  0  Anxiety Difficulty Very difficult Somewhat difficult Somewhat difficult Not difficult at all    2.  Essential hypertension Complaint with meds - Yes Current Medications - losartan Checking BP at home - no Exercising Regularly - Yes Watching Salt intake - Yes Pertinent ROS:  Headache - No Fatigue - Yes Visual Disturbances - No Chest pain - No Dyspnea - No Palpitations - Yes LE edema - Yes They report good compliance with medications and can restate their regimen by memory. No medication side effects.  BP Readings from Last 3 Encounters:  09/10/23 102/66  09/03/23 110/80  08/07/23 127/78     3. Vitamin D deficiency Pt has been taking oral repletion therapy. Denies bone pain and tenderness, muscle weakness, fracture, and difficulty walking. Lab Results  Component Value Date   VD25OH 61.1 11/21/2022   VD25OH 62.6 08/21/2019   Lab Results  Component Value Date   CALCIUM 9.4 06/23/2023   PHOS 4.2 08/21/2019     4. Malaise and fatigue Ongoing and worsening for several months as she reports she has not been sleeping well. No excessive caffeine or stimulant use to attribute to  insomnia.   5. Polyuria Reports she has been going to the bathroom several times per day and has to pee for a long time when she goes. Denies dysuria.     Relevant past medical, surgical, family, and social history reviewed and updated as indicated.  Allergies and medications reviewed and updated. Data reviewed: Chart in Epic.   Past Medical History:  Diagnosis Date   Anxiety    Failed therapy with Paxil, Lexparo, Prozac, Wellbutrin, Xanax, and Ativan   Arthritis    Asthma    Cardiac disease    Angina   Chronic back pain    Constipation 08/26/2019   Depression    Failed therapy with Paxil, Lexparo, Prozac, Wellbutrin, and Ativan   Disease of thyroid gland 12/28/2011   multiple thyroid nodules in 2013 - [place] on 03/08/14 showed heterogeneous appearance of thyroid gland without focal nodule   Eczema    Generalized seizure (HCC)    stress related   GERD (gastroesophageal reflux disease)     History of DVT (deep vein thrombosis) 1988   Hypertension    Hyperthyroidism    Hypothyroidism    IBS (irritable bowel syndrome)    Internal derangement of left knee    Lactose intolerance    Migraine    Sleep apnea    wears CPAP   Vitamin D deficiency     Past Surgical History:  Procedure Laterality Date   CESAREAN SECTION     x3   COLONOSCOPY     HEMANGIOMA EXCISION Right    shoulder   IR RADIOLOGIST EVAL & MGMT  05/04/2021   MASS EXCISION Right    lump from palm of hand   OOPHORECTOMY     OTHER SURGICAL HISTORY Right    Repair of tib-fib fracture   TOTAL ABDOMINAL HYSTERECTOMY     TOTAL KNEE ARTHROPLASTY Left 06/04/2023   Procedure: LEFT TOTAL KNEE ARTHROPLASTY;  Surgeon: Marcene Corning, MD;  Location: [facility];  Service: Orthopedics;  Laterality: Left;   TUBAL LIGATION     TUMOR EXCISION Right    Axilla - benign   VASCULAR SURGERY Right 1989   blood clot removed from neck    Social History   Socioeconomic History   Marital status: Married    Spouse name: Not on file   Number of children: Not on file   Years of education: Not on file   Highest education level: Associate degree: occupational, Scientist, product/process development, or vocational program  Occupational History   Not on file  Tobacco Use   Smoking status: Former    Types: Cigarettes   Smokeless tobacco: Never   Tobacco comments:    Smoked when she was 13 for about a week- 09/03/23  Vaping Use   Vaping status: Never Used  Substance and Sexual Activity   Alcohol use: Never   Drug use: Never   Sexual activity: Not on file  Other Topics Concern   Not on file  Social History Narrative   Not on file   Social Determinants of Health   Financial Resource Strain: Low Risk  (05/07/2023)   Overall Financial Resource Strain (CARDIA)    Difficulty of Paying Living Expenses: Not hard at all  Food Insecurity: No Food Insecurity (05/07/2023)   Hunger Vital Sign    Worried About Running Out of Food in the Last Year: Never true     Ran Out of Food in the Last Year: Never true  Transportation Needs: No Transportation Needs (05/07/2023)   PRAPARE -  Administrator, Civil Service (Medical): No    Lack of Transportation (Non-Medical): No  Physical Activity: Sufficiently Active (05/07/2023)   Exercise Vital Sign    Days of Exercise per Week: 5 days    Minutes of Exercise per Session: 30 min  Stress: No Stress Concern Present (05/07/2023)   [employer] of Occupational Health - Occupational Stress Questionnaire    Feeling of Stress : Not at all  Social Connections: Moderately Integrated (05/07/2023)   Social Connection and Isolation Panel [NHANES]    Frequency of Communication with Friends and Family: More than three times a week    Frequency of Social Gatherings with Friends and Family: Once a week    Attends Religious Services: 1 to 4 times per year    Active Member of [employer] or Organizations: No    Attends Banker Meetings: Not on file    Marital Status: Married  Intimate Partner Violence: Not on file    Outpatient Encounter Medications as of 09/10/2023  Medication Sig   aspirin EC 81 MG tablet Take 1 tablet (81 mg total) by mouth 2 (two) times daily. For 2 weeks then back to once a day for DVT prevention.   budesonide (PULMICORT) 0.5 MG/2ML nebulizer solution Take 2 mLs (0.5 mg total) by nebulization in the morning and at bedtime.   buPROPion (WELLBUTRIN XL) 300 MG 24 hr tablet Take 1 tablet (300 mg total) by mouth daily.   desvenlafaxine (PRISTIQ) 25 MG 24 hr tablet Take 1 tablet (25 mg total) by mouth daily.   docusate sodium (COLACE) 100 MG capsule Take 1 capsule (100 mg total) by mouth daily. (Patient taking differently: Take 100 mg by mouth 2 (two) times daily.)   fluticasone (FLONASE) 50 MCG/ACT nasal spray Place 1-2 sprays into both nostrils daily as needed (nasal congestion).   Fremanezumab-vfrm (AJOVY) 225 MG/1.5ML SOAJ Inject 225 mg into the skin every 30 (thirty) days.    levalbuterol (XOPENEX HFA) 45 MCG/ACT inhaler Inhale 2 puffs into the lungs every 4 (four) hours as needed for wheezing or shortness of breath (coughing fits).   levalbuterol (XOPENEX) 0.63 MG/3ML nebulizer solution Take 3 mLs (0.63 mg total) by nebulization every 4 (four) hours as needed for wheezing or shortness of breath (coughing fits).   levocetirizine (XYZAL) 5 MG tablet TAKE 1 TABLET BY MOUTH EVERY DAY IN THE EVENING   linaclotide (LINZESS) 72 MCG capsule Take 1 capsule (72 mcg total) by mouth daily before breakfast.   losartan (COZAAR) 50 MG tablet TAKE 1 TABLET DAILY *CAMBER   Multiple Vitamins-Minerals (MULTIVITAMIN ADULT PO) Take 1 tablet by mouth daily.   pantoprazole (PROTONIX) 40 MG tablet Take 1 tablet (40 mg total) by mouth daily.   Rimegepant Sulfate (NURTEC) 75 MG TBDP Take 1 tablet (75 mg total) by mouth as needed (migraine).   SYNTHROID 100 MCG tablet Take 100 mcg by mouth every other day.   Vitamin D, Ergocalciferol, (DRISDOL) 1.25 MG (50000 UT) CAPS capsule Take 50,000 Units by mouth every 7 (seven) days.   [DISCONTINUED] hydrOXYzine (VISTARIL) 50 MG capsule TAKE 1 CAPSULE BY MOUTH AT BEDTIME AS NEEDED- MAY REPEAT 1 CAPSULE IF NEEDED FOR INSOMNIA (Patient not taking: Reported on 09/10/2023)   No facility-administered encounter medications on file as of 09/10/2023.    Allergies  Allergen Reactions   Iodine Anaphylaxis   Iohexol Anaphylaxis    Pre meds given in past with no reaction noted.     Latex Anaphylaxis and Other (See  Comments)   Topamax [Topiramate] Anaphylaxis   Zanaflex [Tizanidine] Shortness Of Breath    Swelling, chest pain     Review of Systems  Constitutional:  Positive for activity change, appetite change and fatigue. Negative for chills, diaphoresis, fever and unexpected weight change.  Eyes:  Negative for photophobia.  Respiratory:  Negative for cough and shortness of breath.   Cardiovascular:  Positive for palpitations. Negative for chest pain and  leg swelling.  Gastrointestinal:  Negative for abdominal pain.  Endocrine: Positive for polyuria. Negative for cold intolerance, heat intolerance, polydipsia and polyphagia.  Genitourinary:  Negative for decreased urine volume and difficulty urinating.  Musculoskeletal:  Negative for arthralgias and myalgias.  Neurological:  Negative for dizziness, tremors, seizures, syncope, facial asymmetry, speech difficulty, weakness, light-headedness, numbness and headaches.  Psychiatric/Behavioral:  Positive for agitation, behavioral problems, decreased concentration, dysphoric mood and sleep disturbance. Negative for confusion, hallucinations, self-injury and suicidal ideas. The patient is nervous/anxious. The patient is not hyperactive.   All other systems reviewed and are negative.       Objective:  BP 102/66   Pulse 71   Temp (!) 97.4 F (36.3 C) (Temporal)   Ht 5' 7.25" (1.708 m)   Wt 179 lb 3.2 oz (81.3 kg)   SpO2 98%   BMI 27.86 kg/m    Wt Readings from Last 3 Encounters:  09/10/23 179 lb 3.2 oz (81.3 kg)  09/03/23 180 lb 12 oz (82 kg)  08/07/23 179 lb 9.6 oz (81.5 kg)    Physical Exam Vitals and nursing note reviewed.  Constitutional:      General: She is not in acute distress.    Appearance: Normal appearance. She is not ill-appearing, toxic-appearing or diaphoretic.  HENT:     Head: Normocephalic and atraumatic.     Mouth/Throat:     Mouth: Mucous membranes are moist.  Eyes:     Pupils: Pupils are equal, round, and reactive to light.  Cardiovascular:     Rate and Rhythm: Normal rate.  Pulmonary:     Effort: Pulmonary effort is normal.  Musculoskeletal:     Cervical back: Neck supple.     Right lower leg: No edema.     Left lower leg: No edema.  Skin:    General: Skin is warm and dry.     Capillary Refill: Capillary refill takes less than 2 seconds.  Neurological:     General: No focal deficit present.     Mental Status: She is alert and oriented to person, place,  and time.  Psychiatric:        Attention and Perception: Attention and perception normal.        Mood and Affect: Mood is anxious. Affect is tearful.        Speech: Speech is rapid and pressured.        Behavior: Behavior normal. Behavior is cooperative.        Thought Content: Thought content normal.        Cognition and Memory: Cognition and memory normal.        Judgment: Judgment normal.     Results for orders placed or performed during the hospital encounter of 06/23/23  Basic metabolic panel  Result Value Ref Range   Sodium 144 135 - 145 mmol/L   Potassium 3.2 (L) 3.5 - 5.1 mmol/L   Chloride 109 98 - 111 mmol/L   CO2 22 22 - 32 mmol/L   Glucose, Bld 107 (H) 70 - 99 mg/dL   BUN  13 6 - 20 mg/dL   Creatinine, Ser 7.56 0.44 - 1.00 mg/dL   Calcium 9.4 8.9 - 43.3 mg/dL   GFR, Estimated >29 >51 mL/min   Anion gap 13 5 - 15  CBC  Result Value Ref Range   WBC 4.5 4.0 - 10.5 K/uL   RBC 4.14 3.87 - 5.11 MIL/uL   Hemoglobin 12.8 12.0 - 15.0 g/dL   HCT 88.4 16.6 - 06.3 %   MCV 94.2 80.0 - 100.0 fL   MCH 30.9 26.0 - 34.0 pg   MCHC 32.8 30.0 - 36.0 g/dL   RDW 01.6 01.0 - 93.2 %   Platelets 199 150 - 400 K/uL   nRBC 0.0 0.0 - 0.2 %  Lipase, blood  Result Value Ref Range   Lipase 27 11 - 51 U/L  Hepatic function panel  Result Value Ref Range   Total Protein 7.1 6.5 - 8.1 g/dL   Albumin 4.4 3.5 - 5.0 g/dL   AST 22 15 - 41 U/L   ALT 18 0 - 44 U/L   Alkaline Phosphatase 72 38 - 126 U/L   Total Bilirubin 1.0 0.3 - 1.2 mg/dL   Bilirubin, Direct 0.2 0.0 - 0.2 mg/dL   Indirect Bilirubin 0.8 0.3 - 0.9 mg/dL  Troponin I (High Sensitivity)  Result Value Ref Range   Troponin I (High Sensitivity) <2 <18 ng/L  Troponin I (High Sensitivity)  Result Value Ref Range   Troponin I (High Sensitivity) <2 <18 ng/L       Pertinent labs & imaging results that were available during my care of the patient were reviewed by me and considered in my medical decision making.  Assessment & Plan:   Aimar was seen today for insomnia.  Diagnoses and all orders for this visit:  Primary insomnia Feel this is due to uncontrolled anxiety and depression. Will add Pristiq to regimen. Sleep hygiene discussed in detail.  -     Bayer DCA Hb A1c Waived -     CMP14+EGFR -     CBC with Differential/Platelet -     VITAMIN D 25 Hydroxy (Vit-D Deficiency, Fractures) -     Vitamin B12 -     Thyroid Panel With TSH  Essential hypertension BP well controlled. Changes were not made in regimen today. Goal BP is 130/80. Pt aware to report any persistent high or low readings. DASH diet and exercise encouraged. Exercise at least 150 minutes per week and increase as tolerated. Goal BMI > 25. Stress management encouraged. Avoid nicotine and tobacco product use. Avoid excessive alcohol and NSAID's. Avoid more than 2000 mg of sodium daily. Medications as prescribed. Follow up as scheduled.  -     CMP14+EGFR -     CBC with Differential/Platelet -     Thyroid Panel With TSH  Vitamin D deficiency Labs pending. Continue repletion therapy. If indicated, will change repletion dosage. Eat foods rich in Vit D including milk, orange juice, yogurt with vitamin D added, salmon or mackerel, canned tuna fish, cereals with vitamin D added, and cod liver oil. Get out in the sun but make sure to wear at least SPF 30 sunscreen.  -     CMP14+EGFR -     VITAMIN D 25 Hydroxy (Vit-D Deficiency, Fractures)  Malaise and fatigue Polyuria  Will check for UTI and possible underlying causes such as diabetes, renal disease, or other causes. Further treatment pending lab results.  -     Bayer DCA Hb A1c Waived -  CMP14+EGFR -     CBC with Differential/Platelet -     VITAMIN D 25 Hydroxy (Vit-D Deficiency, Fractures) -     Vitamin B12 -     Thyroid Panel With TSH -     Urinalysis, Routine w reflex microscopic  Severe episode of recurrent major depressive disorder, without psychotic features (HCC) Anxiety Uncontrolled. Will add  below to see if beneficial. Will follow up in 6-8 weeks for reevaluation, sooner if warranted.  -     desvenlafaxine (PRISTIQ) 25 MG 24 hr tablet; Take 1 tablet (25 mg total) by mouth daily.     Continue all other maintenance medications.  Follow up plan: Return in about 6 weeks (around 10/22/2023), or if symptoms worsen or fail to improve, for GAD, depression.   Continue healthy lifestyle choices, including diet (rich in fruits, vegetables, and lean proteins, and low in salt and simple carbohydrates) and exercise (at least 30 minutes of moderate physical activity daily).  Educational handout given for insomnia  The above assessment and management plan was discussed with the patient. The patient verbalized understanding of and has agreed to the management plan. Patient is aware to call the clinic if they develop any new symptoms or if symptoms persist or worsen. Patient is aware when to return to the clinic for a follow-up visit. Patient educated on when it is appropriate to go to the emergency department.   Kari Baars, FNP-C [facility] [phone]

## 2023-09-11 LAB — CBC WITH DIFFERENTIAL/PLATELET
Basophils Absolute: 0 10*3/uL (ref 0.0–0.2)
Basos: 0 %
EOS (ABSOLUTE): 0.1 10*3/uL (ref 0.0–0.4)
Eos: 2 %
Hematocrit: 45.2 % (ref 34.0–46.6)
Hemoglobin: 14.8 g/dL (ref 11.1–15.9)
Immature Grans (Abs): 0 10*3/uL (ref 0.0–0.1)
Immature Granulocytes: 0 %
Lymphocytes Absolute: 1.3 10*3/uL (ref 0.7–3.1)
Lymphs: 31 %
MCH: 30.3 pg (ref 26.6–33.0)
MCHC: 32.7 g/dL (ref 31.5–35.7)
MCV: 92 fL (ref 79–97)
Monocytes Absolute: 0.5 10*3/uL (ref 0.1–0.9)
Monocytes: 11 %
Neutrophils Absolute: 2.3 10*3/uL (ref 1.4–7.0)
Neutrophils: 56 %
Platelets: 183 10*3/uL (ref 150–450)
RBC: 4.89 x10E6/uL (ref 3.77–5.28)
RDW: 12 % (ref 11.7–15.4)
WBC: 4.1 10*3/uL (ref 3.4–10.8)

## 2023-09-11 LAB — CMP14+EGFR
ALT: 17 [IU]/L (ref 0–32)
AST: 15 [IU]/L (ref 0–40)
Albumin: 4.5 g/dL (ref 3.8–4.9)
Alkaline Phosphatase: 86 [IU]/L (ref 44–121)
BUN/Creatinine Ratio: 16 (ref 9–23)
BUN: 12 mg/dL (ref 6–24)
Bilirubin Total: 0.5 mg/dL (ref 0.0–1.2)
CO2: 23 mmol/L (ref 20–29)
Calcium: 9.7 mg/dL (ref 8.7–10.2)
Chloride: 104 mmol/L (ref 96–106)
Creatinine, Ser: 0.73 mg/dL (ref 0.57–1.00)
Globulin, Total: 1.9 g/dL (ref 1.5–4.5)
Glucose: 126 mg/dL — ABNORMAL HIGH (ref 70–99)
Potassium: 4.5 mmol/L (ref 3.5–5.2)
Sodium: 144 mmol/L (ref 134–144)
Total Protein: 6.4 g/dL (ref 6.0–8.5)
eGFR: 98 mL/min/{1.73_m2} (ref >59)

## 2023-09-11 LAB — THYROID PANEL WITH TSH
Free Thyroxine Index: 3.8 (ref 1.2–4.9)
T3 Uptake Ratio: 30 % (ref 24–39)
T4, Total: 12.8 ug/dL — ABNORMAL HIGH (ref 4.5–12.0)
TSH: 0.96 u[IU]/mL (ref 0.450–4.500)

## 2023-09-11 LAB — VITAMIN B12: Vitamin B-12: 317 pg/mL (ref 232–1245)

## 2023-09-11 LAB — VITAMIN D 25 HYDROXY (VIT D DEFICIENCY, FRACTURES): Vit D, 25-Hydroxy: 82.7 ng/mL (ref 30.0–100.0)

## 2023-09-12 ENCOUNTER — Encounter: Payer: Self-pay | Admitting: Gastroenterology

## 2023-09-12 LAB — SPECIMEN STATUS REPORT

## 2023-09-12 LAB — HGB A1C W/O EAG: Hgb A1c MFr Bld: 5.7 % — ABNORMAL HIGH (ref 4.8–5.6)

## 2023-09-16 ENCOUNTER — Ambulatory Visit (INDEPENDENT_AMBULATORY_CARE_PROVIDER_SITE_OTHER): Payer: BLUE CROSS/BLUE SHIELD | Admitting: Gastroenterology

## 2023-09-16 ENCOUNTER — Encounter: Payer: Self-pay | Admitting: Gastroenterology

## 2023-09-16 VITALS — BP 110/70 | HR 86 | Ht 67.0 in | Wt 181.0 lb

## 2023-09-16 DIAGNOSIS — Z860100 Personal history of colon polyps, unspecified: Secondary | ICD-10-CM | POA: Diagnosis not present

## 2023-09-16 DIAGNOSIS — R1013 Epigastric pain: Secondary | ICD-10-CM

## 2023-09-16 DIAGNOSIS — K5900 Constipation, unspecified: Secondary | ICD-10-CM | POA: Diagnosis not present

## 2023-09-16 DIAGNOSIS — R1032 Left lower quadrant pain: Secondary | ICD-10-CM

## 2023-09-16 DIAGNOSIS — K219 Gastro-esophageal reflux disease without esophagitis: Secondary | ICD-10-CM | POA: Diagnosis not present

## 2023-09-16 MED ORDER — PANTOPRAZOLE SODIUM 40 MG PO TBEC: 40.0000 mg | DELAYED_RELEASE_TABLET | Freq: Two times a day (BID) | ORAL | 2 refills | Status: DC

## 2023-09-16 MED ORDER — NA SULFATE-K SULFATE-MG SULF 17.5-3.13-1.6 GM/177ML PO SOLN: 1.0000 | ORAL | 0 refills | Status: DC

## 2023-09-16 NOTE — Patient Instructions (Addendum)
You have been scheduled for an endoscopy and colonoscopy. Please follow the written instructions given to you at your visit today.  Please pick up your prep supplies at the pharmacy within the next 1-3 days.  If you use inhalers (even only as needed), please bring them with you on the day of your procedure.  DO NOT TAKE 7 DAYS PRIOR TO TEST- Trulicity (dulaglutide) Ozempic, Wegovy (semaglutide) Mounjaro (tirzepatide) Bydureon Bcise (exanatide extended release)  DO NOT TAKE 1 DAY PRIOR TO YOUR TEST Rybelsus (semaglutide) Adlyxin (lixisenatide) Victoza (liraglutide) Byetta (exanatide) __________________________________________________________ We have sent the following medications to your pharmacy for you to pick up at your convenience: Suprep, Pantoprazole   Due to recent changes in healthcare laws, you may see the results of your imaging and laboratory studies on MyChart before your provider has had a chance to review them.  We understand that in some cases there may be results that are confusing or concerning to you. Not all laboratory results come back in the same time frame and the provider may be waiting for multiple results in order to interpret others.  Please give [place] 48 hours in order for your provider to thoroughly review all the results before contacting the office for clarification of your results.   Thank you for choosing me and [facility].  Boone Master , PA-C

## 2023-09-16 NOTE — Progress Notes (Signed)
Chief Complaint: Multiple GI issues Primary GI MD: Gentry Fitz  HPI: 52 year old female history of DVT/CVA (greater than 8 years ago) on aspirin, chronic constipation, anxiety, asthma, presents for evaluation of epigastric pain.  Patient states she has a longstanding history of epigastric pain and GERD.  States she was told in the past she needed an endoscopy but she declined at that time.  She states over the last 6 months her GERD has become uncontrollable to the point where she can even drink liquid without having severe burning epigastric pain that also can sometimes be sharp.  Reports fearful eating.  She is on Protonix 40 Mg once daily which initially provided some relief and now is no longer working.  She states her GERD previously resulted in retching and vomiting.  Being on the Protonix alleviated the retching but her epigastric pain continues.  She does still have her gallbladder.  She is post to be on aspirin for history of CVA 8 years ago but she is noncompliant with that medication.  She states any of her medications make her stomach hurt.  Denies NSAID use.  Patient reports chronic constipation.  States she has tried MiraLAX, magnesium citrate, psyllium husk, FiberCon, and multiple other over-the-counter remedies without relief.  States she might have 1 bowel movement per week if that.  She is prescribed Linzess 72 mcg but states she does not take this regularly.  She states when she did take it regularly it provided relief.  She also has associated LLQ pain.  Patient thinks she has chronic constipation because when she was 66 to 52 years old she had an eating disorder (anorexia/bulimia) in which she abused laxatives to lose weight.  Previous history of rectal bleeding 6 years ago.  Notes she has a history of hemorrhoids.  Has not had rectal bleeding in the last 6 years.  She reports having a colonoscopy around 15 years ago done in [place].  She states they told her that she had  polyps and to come back in 4 months.  Another practice told her to come back in 1 year.  She declined any further colonoscopies.  Got a Cologuard done last summer that was negative.  Denies family history of colon cancer.  Recently got blood work taken and CBC, CMP, TSH were normal   Past Medical History:  Diagnosis Date   Anxiety    Failed therapy with Paxil, Lexparo, Prozac, Wellbutrin, Xanax, and Ativan   Arthritis    Asthma    Cardiac disease    Angina   Chronic back pain    Constipation 08/26/2019   Depression    Failed therapy with Paxil, Lexparo, Prozac, Wellbutrin, and Ativan   Disease of thyroid gland 12/28/2011   multiple thyroid nodules in 2013 - [place] on 03/08/14 showed heterogeneous appearance of thyroid gland without focal nodule   Eczema    Generalized seizure (HCC)    stress related   GERD (gastroesophageal reflux disease)    History of DVT (deep vein thrombosis) 1988   Hypertension    Hyperthyroidism    Hypothyroidism    IBS (irritable bowel syndrome)    Internal derangement of left knee    Lactose intolerance    Migraine    Sleep apnea    wears CPAP   Vitamin D deficiency     Past Surgical History:  Procedure Laterality Date   CESAREAN SECTION     x3   COLONOSCOPY     HEMANGIOMA EXCISION Right  shoulder   IR RADIOLOGIST EVAL & MGMT  05/04/2021   MASS EXCISION Right    lump from palm of hand   OOPHORECTOMY     OTHER SURGICAL HISTORY Right    Repair of tib-fib fracture   TOTAL ABDOMINAL HYSTERECTOMY     TOTAL KNEE ARTHROPLASTY Left 06/04/2023   Procedure: LEFT TOTAL KNEE ARTHROPLASTY;  Surgeon: Marcene Corning, MD;  Location: [facility];  Service: Orthopedics;  Laterality: Left;   TUBAL LIGATION     TUMOR EXCISION Right    Axilla - benign   VASCULAR SURGERY Right 1989   blood clot removed from neck    Current Outpatient Medications  Medication Sig Dispense Refill   aspirin EC 81 MG tablet Take 1 tablet (81 mg total) by mouth 2 (two) times daily.  For 2 weeks then back to once a day for DVT prevention. 30 tablet 11   budesonide (PULMICORT) 0.5 MG/2ML nebulizer solution Take 2 mLs (0.5 mg total) by nebulization in the morning and at bedtime. 120 mL 2   buPROPion (WELLBUTRIN XL) 300 MG 24 hr tablet Take 1 tablet (300 mg total) by mouth daily. 90 tablet 0   desvenlafaxine (PRISTIQ) 25 MG 24 hr tablet Take 1 tablet (25 mg total) by mouth daily. 30 tablet 6   docusate sodium (COLACE) 100 MG capsule Take 1 capsule (100 mg total) by mouth daily. (Patient taking differently: Take 100 mg by mouth 2 (two) times daily.) 90 capsule 1   fluticasone (FLONASE) 50 MCG/ACT nasal spray Place 1-2 sprays into both nostrils daily as needed (nasal congestion). 16 g 3   Fremanezumab-vfrm (AJOVY) 225 MG/1.5ML SOAJ Inject 225 mg into the skin every 30 (thirty) days. 1.5 mL 4   levalbuterol (XOPENEX HFA) 45 MCG/ACT inhaler Inhale 2 puffs into the lungs every 4 (four) hours as needed for wheezing or shortness of breath (coughing fits). 1 each 2   levalbuterol (XOPENEX) 0.63 MG/3ML nebulizer solution Take 3 mLs (0.63 mg total) by nebulization every 4 (four) hours as needed for wheezing or shortness of breath (coughing fits). 75 mL 1   levocetirizine (XYZAL) 5 MG tablet TAKE 1 TABLET BY MOUTH EVERY DAY IN THE EVENING 90 tablet 1   linaclotide (LINZESS) 72 MCG capsule Take 1 capsule (72 mcg total) by mouth daily before breakfast. 90 capsule 2   losartan (COZAAR) 50 MG tablet TAKE 1 TABLET DAILY *CAMBER 90 tablet 0   Multiple Vitamins-Minerals (MULTIVITAMIN ADULT PO) Take 1 tablet by mouth daily.     pantoprazole (PROTONIX) 40 MG tablet Take 1 tablet (40 mg total) by mouth daily. 30 tablet 3   Rimegepant Sulfate (NURTEC) 75 MG TBDP Take 1 tablet (75 mg total) by mouth as needed (migraine). 8 tablet 12   SYNTHROID 100 MCG tablet Take 100 mcg by mouth every other day.     Vitamin D, Ergocalciferol, (DRISDOL) 1.25 MG (50000 UT) CAPS capsule Take 50,000 Units by mouth every 7  (seven) days.     No current facility-administered medications for this visit.    Allergies as of 09/16/2023 - Review Complete 09/16/2023  Allergen Reaction Noted   Iodine Anaphylaxis 08/19/2019   Iohexol Anaphylaxis 07/30/2021   Latex Anaphylaxis and Other (See Comments) 08/19/2019   Topamax [topiramate] Anaphylaxis 08/19/2019   Zanaflex [tizanidine] Shortness Of Breath 06/24/2023    Family History  Problem Relation Age of Onset   Arthritis Mother    Hypertension Mother    Thyroid disease Mother    Depression Mother  Migraines Mother    Arthritis Father    Hypertension Father    Depression Father    Factor V Leiden deficiency Sister    CVA Sister        30s   Thyroid disease Sister    Breast cancer Sister    Multiple sclerosis Sister    Other Sister        guillain barre   Depression Sister    Migraines Sister    CVA Sister    Thyroid disease Sister    Depression Sister    Migraines Sister    Hypertension Maternal Grandmother    Depression Maternal Grandmother    Anxiety disorder Maternal Grandmother    Hypertension Maternal Grandfather    Hypertension Paternal Grandmother    Hypertension Paternal Grandfather    Asthma Daughter    Depression Daughter    Anxiety disorder Daughter    Irritable bowel syndrome Daughter    Lactose intolerance Daughter    Migraines Daughter    Asthma Son    Depression Son    Anxiety disorder Son    Irritable bowel syndrome Son    Lactose intolerance Son    Migraines Son    Asthma Son    Depression Son    Anxiety disorder Son    Irritable bowel syndrome Son    Lactose intolerance Son    Migraines Son    Asthma Niece    Other Niece        antiphospholipid AB syndrome    Social History   Socioeconomic History   Marital status: Married    Spouse name: Not on file   Number of children: Not on file   Years of education: Not on file   Highest education level: Associate degree: occupational, Scientist, product/process development, or vocational  program  Occupational History   Not on file  Tobacco Use   Smoking status: Former    Types: Cigarettes   Smokeless tobacco: Never   Tobacco comments:    Smoked when she was 13 for about a week- 09/03/23  Vaping Use   Vaping status: Never Used  Substance and Sexual Activity   Alcohol use: Never   Drug use: Never   Sexual activity: Not on file  Other Topics Concern   Not on file  Social History Narrative   Not on file   Social Determinants of Health   Financial Resource Strain: Low Risk  (05/07/2023)   Overall Financial Resource Strain (CARDIA)    Difficulty of Paying Living Expenses: Not hard at all  Food Insecurity: No Food Insecurity (05/07/2023)   Hunger Vital Sign    Worried About Running Out of Food in the Last Year: Never true    Ran Out of Food in the Last Year: Never true  Transportation Needs: No Transportation Needs (05/07/2023)   PRAPARE - Administrator, Civil Service (Medical): No    Lack of Transportation (Non-Medical): No  Physical Activity: Sufficiently Active (05/07/2023)   Exercise Vital Sign    Days of Exercise per Week: 5 days    Minutes of Exercise per Session: 30 min  Stress: No Stress Concern Present (05/07/2023)   [employer] of Occupational Health - Occupational Stress Questionnaire    Feeling of Stress : Not at all  Social Connections: Moderately Integrated (05/07/2023)   Social Connection and Isolation Panel [NHANES]    Frequency of Communication with Friends and Family: More than three times a week    Frequency of Social Gatherings with  Friends and Family: Once a week    Attends Religious Services: 1 to 4 times per year    Active Member of [employer] or Organizations: No    Attends Engineer, structural: Not on file    Marital Status: Married  Catering manager Violence: Not on file    Review of Systems:    Constitutional: No weight loss, fever, chills, weakness or fatigue HEENT: Eyes: No change in vision                Ears, Nose, Throat:  No change in hearing or congestion Skin: No rash or itching Cardiovascular: No chest pain, chest pressure or palpitations   Respiratory: No SOB or cough Gastrointestinal: See HPI and otherwise negative Genitourinary: No dysuria or change in urinary frequency Neurological: No headache, dizziness or syncope Musculoskeletal: No new muscle or joint pain Hematologic: No bleeding or bruising Psychiatric: No history of depression or anxiety    Physical Exam:  Vital signs: There were no vitals taken for this visit.  Constitutional: NAD, Well developed, Well nourished, alert and cooperative Head:  Normocephalic and atraumatic. Eyes:   PEERL, EOMI. No icterus. Conjunctiva pink. Respiratory: Respirations even and unlabored. Lungs clear to auscultation bilaterally.   No wheezes, crackles, or rhonchi.  Cardiovascular:  Regular rate and rhythm. No peripheral edema, cyanosis or pallor.  Gastrointestinal:  Soft, nondistended, nontender. No rebound or guarding. Normal bowel sounds. No appreciable masses or hepatomegaly. Rectal:  Not performed.  Msk:  Symmetrical without gross deformities. Without edema, no deformity or joint abnormality.  Neurologic:  Alert and  oriented x4;  grossly normal neurologically.  Skin:   Dry and intact without significant lesions or rashes. Psychiatric: Oriented to person, place and time. Demonstrates good judgement and reason without abnormal affect or behaviors.   RELEVANT LABS AND IMAGING: CBC    Component Value Date/Time   WBC 4.1 09/10/2023 1207   WBC 4.5 06/23/2023 0545   RBC 4.89 09/10/2023 1207   RBC 4.14 06/23/2023 0545   HGB 14.8 09/10/2023 1207   HCT 45.2 09/10/2023 1207   PLT 183 09/10/2023 1207   MCV 92 09/10/2023 1207   MCH 30.3 09/10/2023 1207   MCH 30.9 06/23/2023 0545   MCHC 32.7 09/10/2023 1207   MCHC 32.8 06/23/2023 0545   RDW 12.0 09/10/2023 1207   LYMPHSABS 1.3 09/10/2023 1207   MONOABS 0.5 07/29/2021 2357   EOSABS  0.1 09/10/2023 1207   BASOSABS 0.0 09/10/2023 1207    CMP     Component Value Date/Time   NA 144 09/10/2023 1207   K 4.5 09/10/2023 1207   CL 104 09/10/2023 1207   CO2 23 09/10/2023 1207   GLUCOSE 126 (H) 09/10/2023 1207   GLUCOSE 107 (H) 06/23/2023 0545   BUN 12 09/10/2023 1207   CREATININE 0.73 09/10/2023 1207   CALCIUM 9.7 09/10/2023 1207   PROT 6.4 09/10/2023 1207   ALBUMIN 4.5 09/10/2023 1207   AST 15 09/10/2023 1207   ALT 17 09/10/2023 1207   ALKPHOS 86 09/10/2023 1207   BILITOT 0.5 09/10/2023 1207   GFRNONAA >60 06/23/2023 0545   GFRAA 123 08/21/2019 1428     Assessment/Plan:   Gastroesophageal reflux disease without esophagitis Abdominal pain, epigastric GERD and epigastric pain with eating.  Previously some relief with Protonix 40 Mg once daily now no longer having adequate control.  Denies NSAID use.  No previous EGD.  - EGD for evaluation of gastritis, esophagitis, PUD - If negative would consider ultrasound for evaluation  of cholelithiasis - Increase PPI to twice daily - Educated patient on lifestyle modifications and provided patient education handout - I thoroughly discussed the procedure with the patient (at bedside) to include nature of the procedure, alternatives, benefits, and risks (including but not limited to bleeding, infection, perforation, anesthesia/cardiac pulmonary complications).  Patient verbalized understanding and gave verbal consent to proceed with procedure.   History of colon polyps Reported history of colon polyps on colonoscopy 15 years ago in which she was told to repeat in 1 year but she declined at that time.  Cologuard in 2022 is negative. - Schedule colonoscopy  Constipation, unspecified constipation type LLQ pain Chronic constipation with associated LLQ pain and previously failed magnesium citrate, MiraLAX, fiber, and she is not compliant with her Linzess 72 mcg.  She also had a history of colon polyps and reportedly abuse  laxatives when she was 11-12 secondary to eating disorder.  Normal thyroid.  - Colonoscopy to rule out obstructive polyp - Recommend getting back on Linzess 72 mcg and stressed importance of being compliant with medication as it can provide her relief from her constipation - Please MyChart message me if no improvement or worsening symptoms    Valia Wingard [facility] 09/16/2023, 1:41 PM  Cc: Ellamae Sia, DO

## 2023-09-17 ENCOUNTER — Encounter: Payer: Self-pay | Admitting: Internal Medicine

## 2023-09-18 NOTE — Progress Notes (Signed)
I agree with the assessment and plan as outlined by Ms. McMichael. With the patient's constipation, she would likely benefit from a two-day bowel preparation before her colonoscopy.

## 2023-09-19 ENCOUNTER — Telehealth: Payer: Self-pay | Admitting: *Deleted

## 2023-09-19 NOTE — Telephone Encounter (Signed)
I have spoken to patient to advise that we need to actually have her complete a 2 day bowel purge using miralax and suprep due to her recent history of constipation. Advised that we want to be sure we can see the entire colon well the first time.   New instructions created and made available in mychart for patient. She states that she will look over and follow these.

## 2023-09-19 NOTE — Telephone Encounter (Signed)
-----   Message from Legrand Como sent at 09/18/2023  3:04 PM EDT ----- Change to two day prep pretty please. Thank you! ----- Message ----- From: Imogene Burn, MD Sent: 09/18/2023   2:34 PM EDT To: Legrand Como, PA-C     ----- Message ----- From: Legrand Como, PA-C Sent: 09/16/2023   2:37 PM EDT To: Imogene Burn, MD

## 2023-09-19 NOTE — Telephone Encounter (Signed)
I have left a message for patient to call back. We need her to complete a 2 day bowel purge using miralax and suprep for her upcoming procedure on 09/25/23.

## 2023-09-21 ENCOUNTER — Other Ambulatory Visit: Payer: Self-pay | Admitting: Family Medicine

## 2023-09-21 DIAGNOSIS — F332 Major depressive disorder, recurrent severe without psychotic features: Secondary | ICD-10-CM

## 2023-09-24 ENCOUNTER — Ambulatory Visit: Payer: BLUE CROSS/BLUE SHIELD | Admitting: Family Medicine

## 2023-09-25 ENCOUNTER — Encounter: Payer: Self-pay | Admitting: Internal Medicine

## 2023-09-25 ENCOUNTER — Ambulatory Visit: Payer: BLUE CROSS/BLUE SHIELD | Admitting: Internal Medicine

## 2023-09-25 VITALS — BP 140/77 | HR 78 | Temp 97.6°F | Resp 12 | Ht 67.0 in | Wt 181.0 lb

## 2023-09-25 DIAGNOSIS — Z09 Encounter for follow-up examination after completed treatment for conditions other than malignant neoplasm: Secondary | ICD-10-CM | POA: Diagnosis present

## 2023-09-25 DIAGNOSIS — K219 Gastro-esophageal reflux disease without esophagitis: Secondary | ICD-10-CM

## 2023-09-25 DIAGNOSIS — K2950 Unspecified chronic gastritis without bleeding: Secondary | ICD-10-CM | POA: Diagnosis not present

## 2023-09-25 DIAGNOSIS — Z860100 Personal history of colon polyps, unspecified: Secondary | ICD-10-CM | POA: Diagnosis not present

## 2023-09-25 DIAGNOSIS — R1013 Epigastric pain: Secondary | ICD-10-CM

## 2023-09-25 DIAGNOSIS — K209 Esophagitis, unspecified without bleeding: Secondary | ICD-10-CM | POA: Diagnosis not present

## 2023-09-25 DIAGNOSIS — D120 Benign neoplasm of cecum: Secondary | ICD-10-CM

## 2023-09-25 DIAGNOSIS — K319 Disease of stomach and duodenum, unspecified: Secondary | ICD-10-CM | POA: Diagnosis not present

## 2023-09-25 DIAGNOSIS — K2289 Other specified disease of esophagus: Secondary | ICD-10-CM | POA: Diagnosis not present

## 2023-09-25 MED ORDER — SUCRALFATE 1 GM/10ML PO SUSP: 1.0000 g | Freq: Four times a day (QID) | ORAL | 1 refills | Status: DC

## 2023-09-25 MED ORDER — PANTOPRAZOLE SODIUM 40 MG PO TBEC: 40.0000 mg | DELAYED_RELEASE_TABLET | Freq: Two times a day (BID) | ORAL | 3 refills | Status: DC

## 2023-09-25 MED ORDER — SODIUM CHLORIDE 0.9 % IV SOLN: 500.0000 mL | INTRAVENOUS | Status: DC

## 2023-09-25 NOTE — Patient Instructions (Addendum)
- Await pathology results. - Continue pantoprazole 40 mg twice daily. - Use sucralfate suspension 1 gram PO QID for 4 weeks. - Repeat upper endoscopy in 2 months to check healing. - Perform a colonoscopy today.  - Repeat colonoscopy in 3 years with a two day bowel preparation because the bowel preparation was suboptimal.  YOU HAD AN ENDOSCOPIC PROCEDURE TODAY AT THE [facility]:   Refer to the procedure report that was given to you for any specific questions about what was found during the examination.  If the procedure report does not answer your questions, please call your gastroenterologist to clarify.  If you requested that your care partner not be given the details of your procedure findings, then the procedure report has been included in a sealed envelope for you to review at your convenience later.  YOU SHOULD EXPECT: Some feelings of bloating in the abdomen. Passage of more gas than usual.  Walking can help get rid of the air that was put into your GI tract during the procedure and reduce the bloating. If you had a lower endoscopy (such as a colonoscopy or flexible sigmoidoscopy) you may notice spotting of blood in your stool or on the toilet paper. If you underwent a bowel prep for your procedure, you may not have a normal bowel movement for a few days.  Please Note:  You might notice some irritation and congestion in your nose or some drainage.  This is from the oxygen used during your procedure.  There is no need for concern and it should clear up in a day or so.  SYMPTOMS TO REPORT IMMEDIATELY:  Following lower endoscopy (colonoscopy or flexible sigmoidoscopy):  Excessive amounts of blood in the stool  Significant tenderness or worsening of abdominal pains  Swelling of the abdomen that is new, acute  Fever of 100F or higher  Following upper endoscopy (EGD)  Vomiting of blood or coffee ground material  New chest pain or pain under the shoulder blades  Painful or  persistently difficult swallowing  New shortness of breath  Fever of 100F or higher  Black, tarry-looking stools  For urgent or emergent issues, a gastroenterologist can be reached at any hour by calling [phone]. Do not use MyChart messaging for urgent concerns.    DIET:  We do recommend a small meal at first, but then you may proceed to your regular diet.  Drink plenty of fluids but you should avoid alcoholic beverages for 24 hours.  ACTIVITY:  You should plan to take it easy for the rest of today and you should NOT DRIVE or use heavy machinery until tomorrow (because of the sedation medicines used during the test).    FOLLOW UP: Our staff will call the number listed on your records the next business day following your procedure.  We will call around 7:15- 8:00 am to check on you and address any questions or concerns that you may have regarding the information given to you following your procedure. If we do not reach you, we will leave a message.     If any biopsies were taken you will be contacted by phone or by letter within the next 1-3 weeks.  Please call us at [phone] if you have not heard about the biopsies in 3 weeks.    SIGNATURES/CONFIDENTIALITY: You and/or your care partner have signed paperwork which will be entered into your electronic medical record.  These signatures attest to the fact that that the information above on  your After Visit Summary has been reviewed and is understood.  Full responsibility of the confidentiality of this discharge information lies with you and/or your care-partner.

## 2023-09-25 NOTE — Progress Notes (Signed)
Called to room to assist during endoscopic procedure.  Patient ID and intended procedure confirmed with present staff. Received instructions for my participation in the procedure from the performing physician.  

## 2023-09-25 NOTE — Op Note (Signed)
[facility] Patient Name: Olivia Zamora Procedure Date: 09/25/2023 2:29 PM MRN: [id number] Endoscopist: Madelyn Brunner Hamilton , , [id number] Age: 52 Referring MD:  Date of Birth: 09/24/1971 Gender: Female Account #: [id number] Procedure:                Upper GI endoscopy Indications:              Epigastric abdominal pain, Dysphagia, Heartburn Medicines:                Monitored Anesthesia Care Procedure:                Pre-Anesthesia Assessment:                           - Prior to the procedure, a History and Physical                            was performed, and patient medications and                            allergies were reviewed. The patient's tolerance of                            previous anesthesia was also reviewed. The risks                            and benefits of the procedure and the sedation                            options and risks were discussed with the patient.                            All questions were answered, and informed consent                            was obtained. Prior Anticoagulants: The patient has                            taken no anticoagulant or antiplatelet agents. ASA                            Grade Assessment: III - A patient with severe                            systemic disease. After reviewing the risks and                            benefits, the patient was deemed in satisfactory                            condition to undergo the procedure.                           After obtaining informed consent, the endoscope was  passed under direct vision. Throughout the                            procedure, the patient's blood pressure, pulse, and                            oxygen saturations were monitored continuously. The                            GIF [id number] #[id number] was introduced through the                            mouth, and advanced to the second part of duodenum.                             The upper GI endoscopy was accomplished without                            difficulty. The patient tolerated the procedure                            well. Scope In: Scope Out: Findings:                 LA Grade C (one or more mucosal breaks continuous                            between tops of 2 or more mucosal folds, less than                            75% circumference) esophagitis with no bleeding was                            found in the distal esophagus. Biopsies were taken                            with a cold forceps for histology.                           Localized mucosal variance characterized by                            smoothness and an increased vascular pattern was                            found in the gastric body. Biopsies were taken with                            a cold forceps for histology.                           Biopsies were taken with a cold forceps on the  greater curvature of the gastric body, on the                            lesser curvature of the gastric body, at the                            incisura, on the greater curvature of the gastric                            antrum and on the lesser curvature of the gastric                            antrum for Helicobacter pylori testing.                           The examined duodenum was normal. Complications:            No immediate complications. Estimated Blood Loss:     Estimated blood loss was minimal. Impression:               - LA Grade C esophagitis with no bleeding. Biopsied.                           - Gastric mucosal variant. Biopsied.                           - Normal examined duodenum.                           - Biopsies were taken with a cold forceps for                            Helicobacter pylori testing. Recommendation:           - Await pathology results.                           - Continue pantoprazole 40 mg twice daily.                           -  Use sucralfate suspension 1 gram PO QID for 4                            weeks.                           - Repeat upper endoscopy in 2 months to check                            healing.                           - Perform a colonoscopy today. Dr Particia Lather "Alan Ripper" Leonides Schanz,  09/25/2023 3:22:10 PM

## 2023-09-25 NOTE — Op Note (Signed)
[facility] Patient Name: Olivia Zamora Procedure Date: 09/25/2023 2:27 PM MRN: [id number] Endoscopist: Madelyn Brunner [place] , , [id number] Age: 52 Referring MD:  Date of Birth: [birth year] Gender: Female Account #: [id number] Procedure:                Colonoscopy Indications:              High risk colon cancer surveillance: Personal                            history of colonic polyps Medicines:                Monitored Anesthesia Care Procedure:                Pre-Anesthesia Assessment:                           - Prior to the procedure, a History and Physical                            was performed, and patient medications and                            allergies were reviewed. The patient's tolerance of                            previous anesthesia was also reviewed. The risks                            and benefits of the procedure and the sedation                            options and risks were discussed with the patient.                            All questions were answered, and informed consent                            was obtained. Prior Anticoagulants: The patient has                            taken no anticoagulant or antiplatelet agents. ASA                            Grade Assessment: III - A patient with severe                            systemic disease. After reviewing the risks and                            benefits, the patient was deemed in satisfactory                            condition to undergo the procedure.  After obtaining informed consent, the colonoscope                            was passed under direct vision. Throughout the                            procedure, the patient's blood pressure, pulse, and                            oxygen saturations were monitored continuously. The                            Olympus Scope SN [id number] was introduced through the                            anus and advanced to the  the cecum, identified by                            appendiceal orifice and ileocecal valve. The                            colonoscopy was performed without difficulty. The                            patient tolerated the procedure well. The quality                            of the bowel preparation was fair. The ileocecal                            valve, appendiceal orifice, and rectum were                            photographed. Scope In: 2:54:24 PM Scope Out: 3:15:03 PM Scope Withdrawal Time: 0 hours 17 minutes 57 seconds  Total Procedure Duration: 0 hours 20 minutes 39 seconds  Findings:                 A 7 mm polyp was found in the cecum. The polyp was                            sessile. The polyp was removed with a cold snare.                            Resection and retrieval were complete.                           Non-bleeding internal hemorrhoids were found during                            perianal exam. The hemorrhoids were Grade IV                            (internal hemorrhoids that prolapse and cannot be  reduced manually). Complications:            No immediate complications. Estimated Blood Loss:     Estimated blood loss was minimal. Impression:               - Preparation of the colon was fair.                           - One 7 mm polyp in the cecum, removed with a cold                            snare. Resected and retrieved.                           - Non-bleeding internal hemorrhoids. Recommendation:           - Discharge patient to home (with escort).                           - Await pathology results.                           - Repeat colonoscopy in 3 years with a two day                            bowel preparation because the bowel preparation was                            suboptimal.                           - The findings and recommendations were discussed                            with the patient. Dr Particia Lather  "Olivia Ripper" Leonides Zamora,  09/25/2023 3:25:50 PM

## 2023-09-25 NOTE — Progress Notes (Signed)
Pt's states no medical or surgical changes since previsit or office visit. 

## 2023-09-25 NOTE — Progress Notes (Signed)
Vss nad trans to pacu 

## 2023-09-25 NOTE — Progress Notes (Signed)
GASTROENTEROLOGY PROCEDURE H&P NOTE   Primary Care Physician: Sonny Masters, FNP    Reason for Procedure:   Epigastric ab pain, GERD, dysphagia, history of colon polyps  Plan:    EGD/colonoscopy  Patient is appropriate for endoscopic procedure(s) in the ambulatory ([facility]) setting.  The nature of the procedure, as well as the risks, benefits, and alternatives were carefully and thoroughly reviewed with the patient. Ample time for discussion and questions allowed. The patient understood, was satisfied, and agreed to proceed.     HPI: Olivia Zamora is a 52 y.o. female who presents for EGD/colonoscopy for evaluation of epigastric ab pain, GERD, dysphagia, and history of colon polyps .  Patient was most recently seen in the Gastroenterology Clinic on 09/16/23.  No interval change in medical history since that appointment. Patient does describe some dysphagia that has previously been attributed to her thyroid. Bread sometimes gets stuck at the bottom of her chest and she has to drink water to allow it to pass. Please refer to that note for full details regarding GI history and clinical presentation.   Past Medical History:  Diagnosis Date   Anorexia    Anxiety    Failed therapy with Paxil, Lexparo, Prozac, Wellbutrin, Xanax, and Ativan   Arthritis    Asthma    Bulimia    Cardiac disease    Angina   Chronic back pain    Constipation 08/26/2019   Depression    Failed therapy with Paxil, Lexparo, Prozac, Wellbutrin, and Ativan   Disease of thyroid gland 12/28/2011   multiple thyroid nodules in 2013 - [place] on 03/08/14 showed heterogeneous appearance of thyroid gland without focal nodule   Eczema    Generalized seizure (HCC)    stress related   GERD (gastroesophageal reflux disease)    History of DVT (deep vein thrombosis) 1988   Hypertension    Hyperthyroidism    Hypothyroidism    IBS (irritable bowel syndrome)    Internal derangement of left knee    Lactose intolerance     Migraine    Sleep apnea    wears CPAP   Vitamin D deficiency     Past Surgical History:  Procedure Laterality Date   CESAREAN SECTION     x3   COLONOSCOPY     HEMANGIOMA EXCISION Right    shoulder   IR RADIOLOGIST EVAL & MGMT  05/04/2021   MASS EXCISION Right    lump from palm of hand   OOPHORECTOMY     OTHER SURGICAL HISTORY Right    Repair of tib-fib fracture   TOTAL ABDOMINAL HYSTERECTOMY     TOTAL KNEE ARTHROPLASTY Left 06/04/2023   Procedure: LEFT TOTAL KNEE ARTHROPLASTY;  Surgeon: Marcene Corning, MD;  Location: [facility];  Service: Orthopedics;  Laterality: Left;   TUBAL LIGATION     TUMOR EXCISION Right    Axilla - benign   VASCULAR SURGERY Right 1989   blood clot removed from neck    Prior to Admission medications   Medication Sig Start Date End Date Taking? Authorizing Provider  aspirin EC 81 MG tablet Take 1 tablet (81 mg total) by mouth 2 (two) times daily. For 2 weeks then back to once a day for DVT prevention. 06/04/23   Elodia Florence, PA-C  budesonide (PULMICORT) 0.5 MG/2ML nebulizer solution Take 2 mLs (0.5 mg total) by nebulization in the morning and at bedtime. 09/03/23   Ellamae Sia, DO  buPROPion (WELLBUTRIN XL) 300 MG 24 hr tablet TAKE  1 TABLET BY MOUTH EVERY DAY 09/23/23   Sonny Masters, FNP  desvenlafaxine (PRISTIQ) 25 MG 24 hr tablet Take 1 tablet (25 mg total) by mouth daily. 09/10/23   Sonny Masters, FNP  docusate sodium (COLACE) 100 MG capsule Take 1 capsule (100 mg total) by mouth daily. Patient taking differently: Take 100 mg by mouth 2 (two) times daily. 06/20/22   Gwenlyn Fudge, FNP  fluticasone (FLONASE) 50 MCG/ACT nasal spray Place 1-2 sprays into both nostrils daily as needed (nasal congestion). 09/03/23   Ellamae Sia, DO  Fremanezumab-vfrm (AJOVY) 225 MG/1.5ML SOAJ Inject 225 mg into the skin every 30 (thirty) days. 06/24/23   Penumalli, Glenford Bayley, MD  levalbuterol [facility] HFA) 45 MCG/ACT inhaler Inhale 2 puffs into the lungs every 4 (four) hours as  needed for wheezing or shortness of breath (coughing fits). 09/03/23   Ellamae Sia, DO  levalbuterol (XOPENEX) 0.63 MG/3ML nebulizer solution Take 3 mLs (0.63 mg total) by nebulization every 4 (four) hours as needed for wheezing or shortness of breath (coughing fits). 09/03/23   Ellamae Sia, DO  levocetirizine (XYZAL) 5 MG tablet TAKE 1 TABLET BY MOUTH EVERY DAY IN THE EVENING 09/04/23   Ellamae Sia, DO  linaclotide [facility]) 72 MCG capsule Take 1 capsule (72 mcg total) by mouth daily before breakfast. 05/10/23   Sonny Masters, FNP  losartan (COZAAR) 50 MG tablet TAKE 1 TABLET DAILY *CAMBER 09/09/23   Rakes, Doralee Albino, FNP  Multiple Vitamins-Minerals (MULTIVITAMIN ADULT PO) Take 1 tablet by mouth daily.    [provider]  Na Sulfate-K Sulfate-Mg Sulf (SUPREP BOWEL PREP KIT) 17.5-3.13-1.6 GM/177ML SOLN Take 1 kit by mouth as directed. For colonoscopy prep 09/16/23   Imogene Burn, MD  pantoprazole (PROTONIX) 40 MG tablet Take 1 tablet (40 mg total) by mouth 2 (two) times daily. 09/16/23   Imogene Burn, MD  Rimegepant Sulfate (NURTEC) 75 MG TBDP Take 1 tablet (75 mg total) by mouth as needed (migraine). 06/24/23   Penumalli, Glenford Bayley, MD  SYNTHROID 100 MCG tablet Take 100 mcg by mouth every other day. 12/11/20   [provider]  Vitamin D, Ergocalciferol, (DRISDOL) 1.25 MG (50000 UT) CAPS capsule Take 50,000 Units by mouth every 7 (seven) days.    [provider]    Current Outpatient Medications  Medication Sig Dispense Refill   aspirin EC 81 MG tablet Take 1 tablet (81 mg total) by mouth 2 (two) times daily. For 2 weeks then back to once a day for DVT prevention. 30 tablet 11   budesonide (PULMICORT) 0.5 MG/2ML nebulizer solution Take 2 mLs (0.5 mg total) by nebulization in the morning and at bedtime. 120 mL 2   buPROPion (WELLBUTRIN XL) 300 MG 24 hr tablet TAKE 1 TABLET BY MOUTH EVERY DAY 90 tablet 0   desvenlafaxine (PRISTIQ) 25 MG 24 hr tablet Take 1 tablet (25 mg total) by  mouth daily. 30 tablet 6   docusate sodium (COLACE) 100 MG capsule Take 1 capsule (100 mg total) by mouth daily. (Patient taking differently: Take 100 mg by mouth 2 (two) times daily.) 90 capsule 1   fluticasone (FLONASE) 50 MCG/ACT nasal spray Place 1-2 sprays into both nostrils daily as needed (nasal congestion). 16 g 3   Fremanezumab-vfrm (AJOVY) 225 MG/1.5ML SOAJ Inject 225 mg into the skin every 30 (thirty) days. 1.5 mL 4   levalbuterol (XOPENEX HFA) 45 MCG/ACT inhaler Inhale 2 puffs into the lungs every 4 (  four) hours as needed for wheezing or shortness of breath (coughing fits). 1 each 2   levalbuterol (XOPENEX) 0.63 MG/3ML nebulizer solution Take 3 mLs (0.63 mg total) by nebulization every 4 (four) hours as needed for wheezing or shortness of breath (coughing fits). 75 mL 1   levocetirizine (XYZAL) 5 MG tablet TAKE 1 TABLET BY MOUTH EVERY DAY IN THE EVENING 90 tablet 1   linaclotide (LINZESS) 72 MCG capsule Take 1 capsule (72 mcg total) by mouth daily before breakfast. 90 capsule 2   losartan (COZAAR) 50 MG tablet TAKE 1 TABLET DAILY *CAMBER 90 tablet 0   Multiple Vitamins-Minerals (MULTIVITAMIN ADULT PO) Take 1 tablet by mouth daily.     Na Sulfate-K Sulfate-Mg Sulf (SUPREP BOWEL PREP KIT) 17.5-3.13-1.6 GM/177ML SOLN Take 1 kit by mouth as directed. For colonoscopy prep 354 mL 0   pantoprazole (PROTONIX) 40 MG tablet Take 1 tablet (40 mg total) by mouth 2 (two) times daily. 60 tablet 2   Rimegepant Sulfate (NURTEC) 75 MG TBDP Take 1 tablet (75 mg total) by mouth as needed (migraine). 8 tablet 12   SYNTHROID 100 MCG tablet Take 100 mcg by mouth every other day.     Vitamin D, Ergocalciferol, (DRISDOL) 1.25 MG (50000 UT) CAPS capsule Take 50,000 Units by mouth every 7 (seven) days.     Current Facility-Administered Medications  Medication Dose Route Frequency Provider Last Rate Last Admin   0.9 %  sodium chloride infusion  500 mL Intravenous Continuous Imogene Burn, MD        Allergies  as of 09/25/2023 - Review Complete 09/25/2023  Allergen Reaction Noted   Iodine Anaphylaxis 08/19/2019   Iohexol Anaphylaxis 07/30/2021   Latex Anaphylaxis and Other (See Comments) 08/19/2019   Topamax [topiramate] Anaphylaxis 08/19/2019   Zanaflex [tizanidine] Shortness Of Breath 06/24/2023    Family History  Problem Relation Age of Onset   Arthritis Mother    Hypertension Mother    Thyroid disease Mother    Depression Mother    Migraines Mother    Deep vein thrombosis Mother        in her leg   Arthritis Father    Hypertension Father    Depression Father    CVA Father    Factor V Leiden deficiency Sister    CVA Sister        30s   Thyroid disease Sister    Breast cancer Sister    Multiple sclerosis Sister    Other Sister        guillain barre   Depression Sister    Migraines Sister    CVA Sister    Thyroid disease Sister    Depression Sister    Migraines Sister    Hypertension Maternal Grandmother    Depression Maternal Grandmother    Anxiety disorder Maternal Grandmother    Hypertension Maternal Grandfather    Hypertension Paternal Grandmother    Hypertension Paternal Grandfather    Asthma Daughter    Depression Daughter    Anxiety disorder Daughter    Irritable bowel syndrome Daughter    Lactose intolerance Daughter    Migraines Daughter    Asthma Son    Depression Son    Anxiety disorder Son    Irritable bowel syndrome Son    Lactose intolerance Son    Migraines Son    Asthma Son    Depression Son    Anxiety disorder Son    Irritable bowel syndrome Son    Lactose intolerance  Son    Migraines Son    Asthma Niece    Other Niece        antiphospholipid AB syndrome   Colon cancer Neg Hx    Esophageal cancer Neg Hx     Social History   Socioeconomic History   Marital status: Married    Spouse name: Not on file   Number of children: 3   Years of education: Not on file   Highest education level: Associate degree: occupational, Scientist, product/process development, or  vocational program  Occupational History   Occupation: Retired Charity fundraiser  Tobacco Use   Smoking status: Former    Types: Cigarettes   Smokeless tobacco: Never   Tobacco comments:    Smoked when she was 13 for about a week- 09/03/23  Vaping Use   Vaping status: Never Used  Substance and Sexual Activity   Alcohol use: Never   Drug use: Never   Sexual activity: Not on file  Other Topics Concern   Not on file  Social History Narrative   Not on file   Social Determinants of Health   Financial Resource Strain: Low Risk  (05/07/2023)   Overall Financial Resource Strain (CARDIA)    Difficulty of Paying Living Expenses: Not hard at all  Food Insecurity: No Food Insecurity (05/07/2023)   Hunger Vital Sign    Worried About Running Out of Food in the Last Year: Never true    Ran Out of Food in the Last Year: Never true  Transportation Needs: No Transportation Needs (05/07/2023)   PRAPARE - Administrator, Civil Service (Medical): No    Lack of Transportation (Non-Medical): No  Physical Activity: Sufficiently Active (05/07/2023)   Exercise Vital Sign    Days of Exercise per Week: 5 days    Minutes of Exercise per Session: 30 min  Stress: No Stress Concern Present (05/07/2023)   [employer] of Occupational Health - Occupational Stress Questionnaire    Feeling of Stress : Not at all  Social Connections: Moderately Integrated (05/07/2023)   Social Connection and Isolation Panel [NHANES]    Frequency of Communication with Friends and Family: More than three times a week    Frequency of Social Gatherings with Friends and Family: Once a week    Attends Religious Services: 1 to 4 times per year    Active Member of [employer] or Organizations: No    Attends Engineer, structural: Not on file    Marital Status: Married  Catering manager Violence: Not on file    Physical Exam: Vital signs in last 24 hours: BP 121/85   Pulse 90   Temp 97.6 F (36.4 C)   Ht 5\' 7"  (1.702 m)    Wt 181 lb (82.1 kg)   SpO2 97%   BMI 28.35 kg/m  GEN: NAD EYE: Sclerae anicteric ENT: MMM CV: Non-tachycardic Pulm: No increased WOB GI: Soft NEURO:  Alert & Oriented   Eulah Pont, MD [facility]   09/25/2023 2:12 PM

## 2023-09-26 ENCOUNTER — Other Ambulatory Visit (HOSPITAL_COMMUNITY): Payer: Self-pay

## 2023-09-26 ENCOUNTER — Telehealth: Payer: Self-pay | Admitting: Pharmacy Technician

## 2023-09-26 ENCOUNTER — Telehealth: Payer: Self-pay

## 2023-09-26 NOTE — Telephone Encounter (Signed)
Pharmacy Patient Advocate Encounter   Received notification from Fax that prior authorization for SUCRALFATE LIQ is required/requested.   Insurance verification completed.   The patient is insured through [employer] .   Per test claim: PA required; PA submitted to [employer] [facility] via CoverMyMeds Key/confirmation #/[id number] Status is pending

## 2023-09-26 NOTE — Telephone Encounter (Signed)
Attempted to reach patient for post-procedure f/u call. No answer. Left message for her to please not hesitate to call if she has any questions/concerns regarding her care. 

## 2023-09-30 LAB — SURGICAL PATHOLOGY

## 2023-10-01 ENCOUNTER — Encounter: Payer: Self-pay | Admitting: Internal Medicine

## 2023-10-02 ENCOUNTER — Other Ambulatory Visit: Payer: Self-pay | Admitting: Family Medicine

## 2023-10-02 DIAGNOSIS — F332 Major depressive disorder, recurrent severe without psychotic features: Secondary | ICD-10-CM

## 2023-10-02 DIAGNOSIS — F419 Anxiety disorder, unspecified: Secondary | ICD-10-CM

## 2023-10-04 ENCOUNTER — Ambulatory Visit (INDEPENDENT_AMBULATORY_CARE_PROVIDER_SITE_OTHER): Payer: BLUE CROSS/BLUE SHIELD | Admitting: Family Medicine

## 2023-10-04 ENCOUNTER — Encounter: Payer: Self-pay | Admitting: Family Medicine

## 2023-10-04 VITALS — BP 121/82 | HR 83 | Temp 97.2°F | Ht 67.0 in | Wt 179.4 lb

## 2023-10-04 DIAGNOSIS — G4733 Obstructive sleep apnea (adult) (pediatric): Secondary | ICD-10-CM | POA: Diagnosis not present

## 2023-10-04 DIAGNOSIS — L309 Dermatitis, unspecified: Secondary | ICD-10-CM

## 2023-10-04 DIAGNOSIS — F419 Anxiety disorder, unspecified: Secondary | ICD-10-CM

## 2023-10-04 DIAGNOSIS — F339 Major depressive disorder, recurrent, unspecified: Secondary | ICD-10-CM

## 2023-10-04 DIAGNOSIS — F5101 Primary insomnia: Secondary | ICD-10-CM

## 2023-10-04 MED ORDER — TRIAMCINOLONE ACETONIDE 0.1 % EX CREA
1.0000 | TOPICAL_CREAM | Freq: Two times a day (BID) | CUTANEOUS | 0 refills | Status: DC
Start: 2023-10-04 — End: 2023-11-02

## 2023-10-04 NOTE — Progress Notes (Signed)
Subjective:  [name], female    DOB: Oct 08, 1971, 52 y.o.   MRN: [id number]  Patient Care Team: Sonny Masters, FNP as PCP - General (Family Medicine)   Chief Complaint:  Insomnia (Still not sleeping  ) and Eczema (On back of her head that has been ongoing )   HPI: Olivia Zamora is a 52 y.o. female presenting on 10/04/2023 for Insomnia (Still not sleeping  ) and Eczema (On back of her head that has been ongoing )   Discussed the use of AI scribe software for clinical note transcription with the patient, who gave verbal consent to proceed.  History of Present Illness   The patient, with a history of anxiety and depression, reports significant improvement in her mood and anxiety symptoms since starting Pristiq a few weeks ago. She describes the medication as a "miracle" and notes a decrease in agitation, even during periods of sleeplessness. However, she continues to struggle with insomnia, both in terms of initiating and maintaining sleep. Despite trying various medications and over-the-counter remedies, including Restoril, Ambien, Trazodone, Vistaril, Melatonin, Coms Forte, Sleep Rescue, Benadryl, and Magnesium, the patient has not found a successful solution. She reports lying awake at night, feeling tired but unable to transition into sleep. When she does fall asleep, usually around 6 AM, she sleeps for extended periods, sometimes until the afternoon.  The patient has also been using a CPAP machine for mild sleep apnea, which she reports has helped to some extent by making her feel more tired and less anxious. However, she still struggles to achieve restful sleep.  In addition to her sleep and mental health concerns, the patient reports a long-standing skin issue, characterized by a raised, flaky patch on the back of her neck that occasionally becomes inflamed and painful. She also notes similar, though less severe, spots on her chest and the side of her neck.         10/04/2023   12:06 PM 09/10/2023   11:29 AM 08/07/2023   10:44 AM 07/02/2023   11:38 AM 05/09/2023   11:01 AM  Depression screen PHQ 2/9  Decreased Interest 0 1 1 0 0  Down, Depressed, Hopeless 0 1 1 0 0  PHQ - 2 Score 0 2 2 0 0  Altered sleeping 3 3 3 3  0  Tired, decreased energy 3 3 2 2  0  Change in appetite 3 2 1 1  0  Feeling bad or failure about yourself  0 0 0 0 0  Trouble concentrating 0 2 0 0 0  Moving slowly or fidgety/restless 0 1 0 0 0  Suicidal thoughts 0 0 0 0 0  PHQ-9 Score 9 13 8 6  0  Difficult doing work/chores Somewhat difficult Very difficult Not difficult at all Somewhat difficult Not difficult at all      10/04/2023   12:07 PM 09/10/2023   11:29 AM 08/07/2023   10:44 AM 07/02/2023   11:38 AM  GAD 7 : Generalized Anxiety Score  Nervous, Anxious, on Edge 0 3 0 0  Control/stop worrying 0 0 0 0  Worry too much - different things 0 3 0 0  Trouble relaxing 0 2 0 0  Restless 0 1 0 1  Easily annoyed or irritable 0 3 1 0  Afraid - awful might happen 0 0 0 0  Total GAD 7 Score 0 12 1 1   Anxiety Difficulty Not difficult at all Very difficult Somewhat difficult Somewhat difficult  Relevant past medical, surgical, family, and social history reviewed and updated as indicated.  Allergies and medications reviewed and updated. Data reviewed: Chart in Epic.   Past Medical History:  Diagnosis Date   Anorexia    Anxiety    Failed therapy with Paxil, Lexparo, Prozac, Wellbutrin, Xanax, and Ativan   Arthritis    Asthma    Bulimia    Cardiac disease    Angina   Chronic back pain    Constipation 08/26/2019   Depression    Failed therapy with Paxil, Lexparo, Prozac, Wellbutrin, and Ativan   Disease of thyroid gland 12/28/2011   multiple thyroid nodules in 2013 - [place] on 03/08/14 showed heterogeneous appearance of thyroid gland without focal nodule   Eczema    Generalized seizure (HCC)    stress related   GERD (gastroesophageal reflux disease)    History of DVT  (deep vein thrombosis) 1988   Hypertension    Hyperthyroidism    Hypothyroidism    IBS (irritable bowel syndrome)    Internal derangement of left knee    Lactose intolerance    Migraine    Sleep apnea    wears CPAP   Vitamin D deficiency     Past Surgical History:  Procedure Laterality Date   CESAREAN SECTION     x3   COLONOSCOPY     HEMANGIOMA EXCISION Right    shoulder   IR RADIOLOGIST EVAL & MGMT  05/04/2021   MASS EXCISION Right    lump from palm of hand   OOPHORECTOMY     OTHER SURGICAL HISTORY Right    Repair of tib-fib fracture   TOTAL ABDOMINAL HYSTERECTOMY     TOTAL KNEE ARTHROPLASTY Left 06/04/2023   Procedure: LEFT TOTAL KNEE ARTHROPLASTY;  Surgeon: Marcene Corning, MD;  Location: [facility];  Service: Orthopedics;  Laterality: Left;   TUBAL LIGATION     TUMOR EXCISION Right    Axilla - benign   VASCULAR SURGERY Right 1989   blood clot removed from neck    Social History   Socioeconomic History   Marital status: Married    Spouse name: Not on file   Number of children: 3   Years of education: Not on file   Highest education level: Associate degree: occupational, Scientist, product/process development, or vocational program  Occupational History   Occupation: Retired Charity fundraiser  Tobacco Use   Smoking status: Former    Types: Cigarettes   Smokeless tobacco: Never   Tobacco comments:    Smoked when she was 13 for about a week- 09/03/23  Vaping Use   Vaping status: Never Used  Substance and Sexual Activity   Alcohol use: Never   Drug use: Never   Sexual activity: Not on file  Other Topics Concern   Not on file  Social History Narrative   Not on file   Social Determinants of Health   Financial Resource Strain: Low Risk  (09/30/2023)   Overall Financial Resource Strain (CARDIA)    Difficulty of Paying Living Expenses: Not hard at all  Food Insecurity: No Food Insecurity (09/30/2023)   Hunger Vital Sign    Worried About Running Out of Food in the Last Year: Never true    Ran Out of  Food in the Last Year: Never true  Transportation Needs: No Transportation Needs (09/30/2023)   PRAPARE - Administrator, Civil Service (Medical): No    Lack of Transportation (Non-Medical): No  Physical Activity: Sufficiently Active (09/30/2023)   Exercise Vital Sign  Days of Exercise per Week: 5 days    Minutes of Exercise per Session: 30 min  Stress: No Stress Concern Present (09/30/2023)   [employer] of Occupational Health - Occupational Stress Questionnaire    Feeling of Stress : Not at all  Social Connections: Moderately Integrated (09/30/2023)   Social Connection and Isolation Panel [NHANES]    Frequency of Communication with Friends and Family: More than three times a week    Frequency of Social Gatherings with Friends and Family: Once a week    Attends Religious Services: 1 to 4 times per year    Active Member of [employer] or Organizations: No    Attends Banker Meetings: Not on file    Marital Status: Married  Intimate Partner Violence: Not on file    Outpatient Encounter Medications as of 10/04/2023  Medication Sig   aspirin EC 81 MG tablet Take 1 tablet (81 mg total) by mouth 2 (two) times daily. For 2 weeks then back to once a day for DVT prevention.   budesonide (PULMICORT) 0.5 MG/2ML nebulizer solution Take 2 mLs (0.5 mg total) by nebulization in the morning and at bedtime.   buPROPion (WELLBUTRIN XL) 300 MG 24 hr tablet TAKE 1 TABLET BY MOUTH EVERY DAY   Cyanocobalamin 5000 MCG/ML LIQD Place under the tongue.   desvenlafaxine (PRISTIQ) 25 MG 24 hr tablet TAKE 1 TABLET (25 MG TOTAL) BY MOUTH DAILY.   docusate sodium (COLACE) 100 MG capsule Take 1 capsule (100 mg total) by mouth daily. (Patient taking differently: Take 100 mg by mouth 2 (two) times daily.)   fluticasone (FLONASE) 50 MCG/ACT nasal spray Place 1-2 sprays into both nostrils daily as needed (nasal congestion).   Fremanezumab-vfrm (AJOVY) 225 MG/1.5ML SOAJ Inject 225 mg into the  skin every 30 (thirty) days.   levalbuterol (XOPENEX HFA) 45 MCG/ACT inhaler Inhale 2 puffs into the lungs every 4 (four) hours as needed for wheezing or shortness of breath (coughing fits).   levalbuterol (XOPENEX) 0.63 MG/3ML nebulizer solution Take 3 mLs (0.63 mg total) by nebulization every 4 (four) hours as needed for wheezing or shortness of breath (coughing fits).   levocetirizine (XYZAL) 5 MG tablet TAKE 1 TABLET BY MOUTH EVERY DAY IN THE EVENING   linaclotide (LINZESS) 72 MCG capsule Take 1 capsule (72 mcg total) by mouth daily before breakfast.   losartan (COZAAR) 50 MG tablet TAKE 1 TABLET DAILY *CAMBER   Multiple Vitamins-Minerals (MULTIVITAMIN ADULT PO) Take 1 tablet by mouth daily.   pantoprazole (PROTONIX) 40 MG tablet Take 1 tablet (40 mg total) by mouth 2 (two) times daily.   pantoprazole (PROTONIX) 40 MG tablet Take 1 tablet (40 mg total) by mouth 2 (two) times daily.   Rimegepant Sulfate (NURTEC) 75 MG TBDP Take 1 tablet (75 mg total) by mouth as needed (migraine).   sucralfate (CARAFATE) 1 GM/10ML suspension Take 10 mLs (1 g total) by mouth 4 (four) times daily.   SYNTHROID 100 MCG tablet Take 100 mcg by mouth every other day.   triamcinolone cream (KENALOG) 0.1 % Apply 1 Application topically 2 (two) times daily.   Vitamin D, Ergocalciferol, (DRISDOL) 1.25 MG (50000 UT) CAPS capsule Take 50,000 Units by mouth every 7 (seven) days.   No facility-administered encounter medications on file as of 10/04/2023.    Allergies  Allergen Reactions   Iodine Anaphylaxis   Iohexol Anaphylaxis    Pre meds given in past with no reaction noted.     Latex Anaphylaxis and  Other (See Comments)   Topamax [Topiramate] Anaphylaxis   Zanaflex [Tizanidine] Shortness Of Breath    Swelling, chest pain     Pertinent ROS per HPI, otherwise unremarkable      Objective:  BP 121/82   Pulse 83   Temp (!) 97.2 F (36.2 C) (Temporal)   Ht 5\' 7"  (1.702 m)   Wt 179 lb 6.4 oz (81.4 kg)   SpO2  95%   BMI 28.10 kg/m    Wt Readings from Last 3 Encounters:  10/04/23 179 lb 6.4 oz (81.4 kg)  09/25/23 181 lb (82.1 kg)  09/16/23 181 lb (82.1 kg)    Physical Exam Vitals and nursing note reviewed.  Constitutional:      Appearance: Normal appearance. She is overweight.  HENT:     Head: Normocephalic and atraumatic.     Nose: Nose normal.     Mouth/Throat:     Mouth: Mucous membranes are moist.  Eyes:     Conjunctiva/sclera: Conjunctivae normal.     Pupils: Pupils are equal, round, and reactive to light.  Cardiovascular:     Rate and Rhythm: Normal rate and regular rhythm.     Heart sounds: Normal heart sounds.  Pulmonary:     Effort: Pulmonary effort is normal.     Breath sounds: Normal breath sounds.  Musculoskeletal:     Cervical back: Neck supple.     Right lower leg: No edema.     Left lower leg: No edema.  Skin:    General: Skin is warm and dry.     Capillary Refill: Capillary refill takes less than 2 seconds.     Findings: Erythema and rash (psoterior neck) present. Rash is crusting.  Neurological:     General: No focal deficit present.     Mental Status: She is alert and oriented to person, place, and time.  Psychiatric:        Mood and Affect: Mood is anxious.        Speech: Speech normal.        Behavior: Behavior normal. Behavior is cooperative.        Thought Content: Thought content normal.        Judgment: Judgment normal.       Results for orders placed or performed in visit on 09/25/23  Surgical pathology ([facility])  Result Value Ref Range   SURGICAL PATHOLOGY      SURGICAL PATHOLOGY [facility] [address], [address] [place], [place] [address] Telephone [phone] or [phone] Fax [phone]  REPORT OF SURGICAL PATHOLOGY   Accession #: [id number] Patient Name: Olivia Zamora, Olivia Zamora Visit # : [id number]  MRN: [id number] Physician: Norwood Levo "Alan Ripper" DOB/Age 09-Jul-1971 (Age: 86) Gender: F Collected  Date: 09/25/2023 Received Date: 09/27/2023  FINAL DIAGNOSIS       1. Surgical [P], gastric :       -  ANTRAL AND OXYNTIC MUCOSA WITH MILD CHEMICAL/REACTIVE CHANGE.      -  NO HELICOBACTER PYLORI ORGANISMS IDENTIFIED ON H&E STAINED SLIDE.       2. Surgical [P], altered gastric mucosa :       -  OXYNTIC MUCOSA WITH MILD CHRONIC INACTIVE GASTRITIS AND CHEMICAL/REACTIVE      CHANGE.      -  AN IMMUNOHISTOCHEMICAL STAIN FOR HELICOBACTER PYLORI ORGANISMS IS PENDING AND      WILL BE REPORTED IN AN ADDENDUM.       3. Surgical [P], esophagus :       -  SQUAMOUS MUCOSA WITH MILD BASAL CELL HYPERPLASIA SUG GESTIVE OF REFLUX.       4. Surgical [P], colon, cecum, polyp (1) :       -  TUBULAR ADENOMA, FRAGMENTS.       ELECTRONIC SIGNATURE : Francene Castle, Mark, Pathologist, Electronic Signature  MICROSCOPIC DESCRIPTION  CASE COMMENTS STAINS USED IN DIAGNOSIS: H&E H&E-2 H&E Universal Negative Control-DAB H&E H&E-2 H&E Stains used in diagnosis 2 H. Pylori IHC Some of these immunohistochemical stains may have been developed and the performance characteristics determined by [facility].  Some may not have been cleared or approved by the U.S. Food and Drug Administration.  The FDA has determined that such clearance or approval is not necessary.  This test is used for clinical purposes.  It should not be regarded as investigational or for research.  This laboratory is certified under the Clinical Laboratory Improvement Amendments of 1988 (CLIA-88) as qualified to perform high complexity clinical laboratory testing.  ADDENDUM An immunohistochemical stai n for Helicobacter pylori organisms is negative. Barkley Boards, Pathologist, Electronic Signature ( Signed [phone] [id number])   CLINICAL HISTORY  SPECIMEN(S) OBTAINED 1. Surgical [P], Gastric 2. Surgical [P], Altered Gastric Mucosa 3. Surgical [P], Esophagus 4. Surgical [P], Colon, Cecum, Polyp (1)  SPECIMEN  COMMENTS: 1. Gastroesophageal reflux disease without esophagitis; abdominal pain, epigastric; history of colon polyps; benign neoplasm of cecum SPECIMEN CLINICAL INFORMATION: 1. R/O H.pylori 2. R/O dysplasia 3. R/O eosinophilic esophagitis and fungi / candida 4. R/O adenoma    Gross Description 1. Received in formalin are tan, soft tissue fragments that are submitted in toto.Number: multiple, Size: 0.2 cm smallest to 0.5 cm largest, (1B) ( TA ) 2. Received in formalin are tan, soft tissue fragments that are submitted in toto.Number: 4, Size: 0.2 cm smallest to 0.6 cm largest, (1B) ( TA ) 3. Received in formalin are tan, soft tissue fragments that are submi tted in toto.Number: multiple, Size: 0.2 cm smallest to 0.4 cm largest, (1B) ( TA ) 4. Received in formalin are tan, soft tissue fragments that are submitted in toto.Number: 1 Size: 0.5 cm = 2 sec, (1B) ( TA )        Report signed out from the following location(s) [facility] [address] [place], [place] [address] CLIA #: [id number]  [facility] [address] [place], [place] [address] CLIA #: [id number]        Pertinent labs & imaging results that were available during my care of the patient were reviewed by me and considered in my medical decision making.  Assessment & Plan:  Segen was seen today for insomnia and eczema.  Diagnoses and all orders for this visit:  Primary insomnia -     Ambulatory referral to Psychiatry  Obstructive sleep apnea syndrome  Depression, recurrent (HCC) -     Ambulatory referral to Psychiatry  Anxiety -     Ambulatory referral to Psychiatry  Eczema of scalp -     triamcinolone cream (KENALOG) 0.1 %; Apply 1 Application topically 2 (two) times daily.     Assessment and Plan    Insomnia Difficulty falling asleep and staying asleep despite trials of multiple medications including Restoril, Ambien, Trazodone, Vistaril, and  over-the-counter remedies. CPAP use for mild sleep apnea has provided some relief but not sufficient. -Continue current medications (Trazodone, Vistaril). -Referral to Psychiatry for further evaluation and management. -Plan to conduct gene site testing to guide future medication choices.  Anxiety/Depression Significant improvement in symptoms with Pristiq. -Continue Pristiq.  Dermatologic Concern Chronic scalp lesion with occasional flares causing discomfort and inflammation. -Prescribe Triamcinolone cream, apply twice daily until resolved.  Gastrointestinal On Paraffin 10cc suspension four times daily as prescribed by Gastroenterologist. -Continue Paraffin as directed.          Continue all other maintenance medications.  Follow up plan: Return if symptoms worsen or fail to improve.   Continue healthy lifestyle choices, including diet (rich in fruits, vegetables, and lean proteins, and low in salt and simple carbohydrates) and exercise (at least 30 minutes of moderate physical activity daily).  Educational handout given for sleep hygiene guidelines  The above assessment and management plan was discussed with the patient. The patient verbalized understanding of and has agreed to the management plan. Patient is aware to call the clinic if they develop any new symptoms or if symptoms persist or worsen. Patient is aware when to return to the clinic for a follow-up visit. Patient educated on when it is appropriate to go to the emergency department.   Kari Baars, FNP-C [facility] [phone]

## 2023-10-04 NOTE — Patient Instructions (Signed)
Sleep hygiene guidelines Recommendation Details Regular bedtime and rise time Having a consistent bedtime and rise time leads to more regular sleep schedules and avoids periods of sleep deprivation or periods of extended wakefulness during the night. Avoid napping Avoid napping, especially naps lasting longer than 1 hour and naps late in the day. Limit caffeine Avoid caffeine after lunch. The time between lunch and bedtime represents approximately 2 half-lives for caffeine, and this time window allows for most caffeine to be metabolized before bedtime. Limit alcohol Recommendations are typically focused on avoiding alcohol near bedtime. Alcohol is initially sedating, but activating as it is metabolized. Alcohol also negatively impacts sleep architecture. Avoid nicotine Nicotine is a stimulant and should be avoided near bedtime and at night. Exercise Daytime physical activity is encouraged, in particular, 4 to 6 hours before bedtime, as this may facilitate sleep onset. Rigorous exercise within 2 hours of bedtime is discouraged. Keep the sleep environment quiet and dark  Noise and light exposure during the night can disrupt sleep. White noise or ear plugs are often recommended to reduce noise. Using blackout shades or an eye mask is commonly recommended to reduce light.  This may also include avoiding exposure to television or technology near bedtime, as this can have an impact on circadian rhythms by shifting sleep timing later. Bedroom clock Avoid checking the time at night. This includes alarm clocks and other time pieces (eg, watches and smart phones). Checking the time increases cognitive arousal and prolongs wakefulness. Evening eating Avoid a large meal close to bedtime. Eat a healthy and filling (but not too heavy) meal in the early evening and avoid late-night snacks. Graphic [phone] Version 4.0  2024 Up[employer]Date[employer] and/or its affiliates. All Rights Reserved.

## 2023-10-04 NOTE — Telephone Encounter (Signed)
Pharmacy Patient Advocate Encounter  Received notification from [facility] that Prior Authorization for Sucralfate Suspension has been APPROVED from 09-26-2023 to 09-25-2024   PA #/Case ID/Reference #: [id number]

## 2023-10-10 ENCOUNTER — Ambulatory Visit: Payer: BLUE CROSS/BLUE SHIELD | Admitting: Allergy

## 2023-10-22 ENCOUNTER — Encounter: Payer: Self-pay | Admitting: Family Medicine

## 2023-10-22 ENCOUNTER — Ambulatory Visit: Payer: BLUE CROSS/BLUE SHIELD | Admitting: Family Medicine

## 2023-10-28 ENCOUNTER — Other Ambulatory Visit: Payer: Self-pay | Admitting: Allergy

## 2023-10-31 ENCOUNTER — Ambulatory Visit: Payer: BLUE CROSS/BLUE SHIELD | Admitting: Family Medicine

## 2023-11-02 ENCOUNTER — Other Ambulatory Visit: Payer: Self-pay | Admitting: Family Medicine

## 2023-11-02 ENCOUNTER — Encounter: Payer: Self-pay | Admitting: Family Medicine

## 2023-11-02 DIAGNOSIS — K219 Gastro-esophageal reflux disease without esophagitis: Secondary | ICD-10-CM

## 2023-11-02 DIAGNOSIS — L309 Dermatitis, unspecified: Secondary | ICD-10-CM

## 2023-11-04 ENCOUNTER — Other Ambulatory Visit: Payer: Self-pay | Admitting: Family Medicine

## 2023-11-04 MED ORDER — LEVALBUTEROL TARTRATE 45 MCG/ACT IN AERO: 2.0000 | INHALATION_SPRAY | RESPIRATORY_TRACT | 3 refills | Status: DC | PRN

## 2023-11-04 MED ORDER — TRIAMCINOLONE ACETONIDE 0.1 % EX CREA
1.0000 | TOPICAL_CREAM | Freq: Two times a day (BID) | CUTANEOUS | 0 refills | Status: DC
Start: 2023-11-04 — End: 2023-11-25

## 2023-11-04 MED ORDER — LEVALBUTEROL HCL 0.63 MG/3ML IN NEBU: 0.6300 mg | INHALATION_SOLUTION | RESPIRATORY_TRACT | 5 refills | Status: DC | PRN

## 2023-11-11 ENCOUNTER — Other Ambulatory Visit: Payer: Self-pay | Admitting: Diagnostic Neuroimaging

## 2023-11-11 ENCOUNTER — Other Ambulatory Visit: Payer: Self-pay | Admitting: Allergy

## 2023-11-11 ENCOUNTER — Telehealth (HOSPITAL_COMMUNITY): Payer: Self-pay

## 2023-11-11 NOTE — Telephone Encounter (Signed)
Lvm to confirm 11/12/23 appt

## 2023-11-12 ENCOUNTER — Encounter (HOSPITAL_COMMUNITY): Payer: Self-pay | Admitting: Psychiatry

## 2023-11-12 ENCOUNTER — Ambulatory Visit (HOSPITAL_COMMUNITY): Payer: BLUE CROSS/BLUE SHIELD | Admitting: Psychiatry

## 2023-11-12 DIAGNOSIS — F332 Major depressive disorder, recurrent severe without psychotic features: Secondary | ICD-10-CM

## 2023-11-12 DIAGNOSIS — Z8673 Personal history of transient ischemic attack (TIA), and cerebral infarction without residual deficits: Secondary | ICD-10-CM

## 2023-11-12 DIAGNOSIS — F411 Generalized anxiety disorder: Secondary | ICD-10-CM | POA: Diagnosis not present

## 2023-11-12 DIAGNOSIS — F4001 Agoraphobia with panic disorder: Secondary | ICD-10-CM | POA: Insufficient documentation

## 2023-11-12 DIAGNOSIS — Z8659 Personal history of other mental and behavioral disorders: Secondary | ICD-10-CM | POA: Insufficient documentation

## 2023-11-12 DIAGNOSIS — G4733 Obstructive sleep apnea (adult) (pediatric): Secondary | ICD-10-CM | POA: Diagnosis not present

## 2023-11-12 DIAGNOSIS — F4310 Post-traumatic stress disorder, unspecified: Secondary | ICD-10-CM | POA: Insufficient documentation

## 2023-11-12 MED ORDER — BUPROPION HCL ER (XL) 150 MG PO TB24: 150.0000 mg | ORAL_TABLET | Freq: Every day | ORAL | 0 refills | Status: DC

## 2023-11-12 MED ORDER — DESVENLAFAXINE SUCCINATE ER 50 MG PO TB24: 50.0000 mg | ORAL_TABLET | Freq: Every day | ORAL | 1 refills | Status: DC
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
## 2023-11-12 NOTE — Patient Instructions (Addendum)
We decreased the Wellbutrin XL to 150 mg once daily today with a plan to come off of this medication after 1 month.  We also increased the dose of the Pristiq to 50 mg once daily today as well and we will likely plan on increasing to the max dose next month.  When you get a chance, please call your insurer and ask for a CBT (cognitive behavioral therapy) provider that is in network if you can find one that has a specialty in eating disorders that would be good but anyone with trauma focus would be beneficial.  You can find the free workbook here: BaseballBoycott.ch  I will coordinate with your PCP about getting an iron panel and folate level as well as placing a nutrition referral but I want you in the meantime to try and be consistent with your meals as best you can.  We can consider the medication Remeron at your next appointment which would be very helpful for the nausea, low appetite, insomnia and as we increase the dose we will act more like an antidepressant and antianxiety medicine and of itself.  Your main homework assignment is to find ways in which you can be kind to yourself and keep practicing the deep breathing exercise we did.

## 2023-11-14 ENCOUNTER — Ambulatory Visit: Payer: BLUE CROSS/BLUE SHIELD

## 2023-11-14 VITALS — Ht 67.0 in | Wt 180.0 lb

## 2023-11-14 DIAGNOSIS — K219 Gastro-esophageal reflux disease without esophagitis: Secondary | ICD-10-CM

## 2023-11-14 NOTE — Progress Notes (Signed)
No egg or soy allergy known to patient  No issues known to pt with past sedation with any surgeries or procedures Patient denies ever being told they had issues or difficulty with intubation  No FH of Malignant Hyperthermia Pt is not on diet pills Pt is not on  home 02  Pt is not on blood thinners  Pt with chronic constipation  No A fib or A flutter Have any cardiac testing pending-- no  LOA: independent    Patient's chart reviewed by Cathlyn Parsons CNRA prior to previsit and patient appropriate for the [facility].  Previsit completed and red dot placed by patient's name on their procedure day (on provider's schedule).     PV competed with patient. Prep instructions sent via mychart and home address.

## 2023-11-18 ENCOUNTER — Encounter: Payer: Self-pay | Admitting: Internal Medicine

## 2023-11-18 ENCOUNTER — Telehealth: Payer: BLUE CROSS/BLUE SHIELD

## 2023-11-19 ENCOUNTER — Encounter: Payer: BLUE CROSS/BLUE SHIELD | Admitting: Family Medicine

## 2023-11-22 ENCOUNTER — Other Ambulatory Visit: Payer: Self-pay | Admitting: Family Medicine

## 2023-11-22 NOTE — Telephone Encounter (Signed)
Pharmacy Patient Advocate Encounter   Received notification from [facility] that Prior Authorization for Sucralfate Suspension has been APPROVED from 09-26-2023 to 09-25-2024    PA #/Case ID/Reference #: [id number]

## 2023-11-24 ENCOUNTER — Other Ambulatory Visit: Payer: Self-pay | Admitting: Family Medicine

## 2023-11-24 DIAGNOSIS — F5101 Primary insomnia: Secondary | ICD-10-CM

## 2023-11-25 ENCOUNTER — Encounter: Payer: Self-pay | Admitting: Internal Medicine

## 2023-11-25 ENCOUNTER — Ambulatory Visit (AMBULATORY_SURGERY_CENTER): Payer: BLUE CROSS/BLUE SHIELD | Admitting: Internal Medicine

## 2023-11-25 VITALS — BP 121/79 | HR 74 | Temp 97.7°F | Resp 9 | Ht 67.0 in | Wt 179.0 lb

## 2023-11-25 DIAGNOSIS — Z8719 Personal history of other diseases of the digestive system: Secondary | ICD-10-CM

## 2023-11-25 DIAGNOSIS — K219 Gastro-esophageal reflux disease without esophagitis: Secondary | ICD-10-CM

## 2023-11-25 DIAGNOSIS — Z09 Encounter for follow-up examination after completed treatment for conditions other than malignant neoplasm: Secondary | ICD-10-CM | POA: Diagnosis present

## 2023-11-25 MED ORDER — SODIUM CHLORIDE 0.9 % IV SOLN: 500.0000 mL | INTRAVENOUS | Status: DC

## 2023-11-25 NOTE — Progress Notes (Signed)
Report to PACU, RN, vss, BBS= Clear.  

## 2023-11-25 NOTE — Patient Instructions (Addendum)
Resume previous diet and medications. Return to GI office in 6 months.(Call office to schedule f/u appointment) Continue taking Pantoprazole BID as directed.  YOU HAD AN ENDOSCOPIC PROCEDURE TODAY AT THE [facility]:   Refer to the procedure report that was given to you for any specific questions about what was found during the examination.  If the procedure report does not answer your questions, please call your gastroenterologist to clarify.  If you requested that your care partner not be given the details of your procedure findings, then the procedure report has been included in a sealed envelope for you to review at your convenience later.  YOU SHOULD EXPECT: Some feelings of bloating in the abdomen. Passage of more gas than usual.  Walking can help get rid of the air that was put into your GI tract during the procedure and reduce the bloating. If you had a lower endoscopy (such as a colonoscopy or flexible sigmoidoscopy) you may notice spotting of blood in your stool or on the toilet paper. If you underwent a bowel prep for your procedure, you may not have a normal bowel movement for a few days.  Please Note:  You might notice some irritation and congestion in your nose or some drainage.  This is from the oxygen used during your procedure.  There is no need for concern and it should clear up in a day or so.  SYMPTOMS TO REPORT IMMEDIATELY:  Following upper endoscopy (EGD)  Vomiting of blood or coffee ground material  New chest pain or pain under the shoulder blades  Painful or persistently difficult swallowing  New shortness of breath  Fever of 100F or higher  Black, tarry-looking stools  For urgent or emergent issues, a gastroenterologist can be reached at any hour by calling [phone]. Do not use MyChart messaging for urgent concerns.    DIET:  We do recommend a small meal at first, but then you may proceed to your regular diet.  Drink plenty of fluids but you should  avoid alcoholic beverages for 24 hours.  ACTIVITY:  You should plan to take it easy for the rest of today and you should NOT DRIVE or use heavy machinery until tomorrow (because of the sedation medicines used during the test).    FOLLOW UP: Our staff will call the number listed on your records the next business day following your procedure.  We will call around 7:15- 8:00 am to check on you and address any questions or concerns that you may have regarding the information given to you following your procedure. If we do not reach you, we will leave a message.     If any biopsies were taken you will be contacted by phone or by letter within the next 1-3 weeks.  Please call us at [phone] if you have not heard about the biopsies in 3 weeks.    SIGNATURES/CONFIDENTIALITY: You and/or your care partner have signed paperwork which will be entered into your electronic medical record.  These signatures attest to the fact that that the information above on your After Visit Summary has been reviewed and is understood.  Full responsibility of the confidentiality of this discharge information lies with you and/or your care-partner.

## 2023-11-25 NOTE — Op Note (Addendum)
[facility] Patient Name: Olivia Zamora Procedure Date: 11/25/2023 7:25 AM MRN: [id number] Endoscopist: Madelyn Brunner [place] , , [id number] Age: 52 Referring MD:  Date of Birth: January 25, 1971 Gender: Female Account #: [id number] Procedure:                Upper GI endoscopy Indications:              Follow-up of esophagitis Medicines:                Monitored Anesthesia Care Procedure:                Pre-Anesthesia Assessment:                           - Prior to the procedure, a History and Physical                            was performed, and patient medications and                            allergies were reviewed. The patient's tolerance of                            previous anesthesia was also reviewed. The risks                            and benefits of the procedure and the sedation                            options and risks were discussed with the patient.                            All questions were answered, and informed consent                            was obtained. Prior Anticoagulants: The patient has                            taken no anticoagulant or antiplatelet agents. ASA                            Grade Assessment: II - A patient with mild systemic                            disease. After reviewing the risks and benefits,                            the patient was deemed in satisfactory condition to                            undergo the procedure.                           After obtaining informed consent, the endoscope was  passed under direct vision. Throughout the                            procedure, the patient's blood pressure, pulse, and                            oxygen saturations were monitored continuously. The                            GIF [id number] #[id number] was introduced through the                            mouth, and advanced to the second part of duodenum.                            The upper GI endoscopy  was accomplished without                            difficulty. The patient tolerated the procedure                            well. Scope In: Scope Out: Findings:                 The examined esophagus was normal.                           The entire examined stomach was normal.                           The examined duodenum was normal. Complications:            No immediate complications. Estimated Blood Loss:     Estimated blood loss was minimal. Impression:               - Normal esophagus.                           - Normal stomach.                           - Normal examined duodenum.                           - No specimens collected. Recommendation:           - Discharge patient to home (with escort).                           - Your prior esophagitis has healed!                           - Patient preferred to stay on pantoprazole 40 mg                            BID so will maintain this dosing since she is just  feeling better.                           - Okay to stop sucralfate.                           - Return to GI clinic in 6 months.                           - The findings and recommendations were discussed                            with the patient. Dr Particia Lather "Alan Ripper" Leonides Schanz,  11/25/2023 8:37:19 AM

## 2023-11-25 NOTE — Progress Notes (Signed)
GASTROENTEROLOGY PROCEDURE H&P NOTE   Primary Care Physician: Sonny Masters, FNP    Reason for Procedure:   History of grade C esophagitis  Plan:    EGD  Patient is appropriate for endoscopic procedure(s) in the ambulatory ([facility]) setting.  The nature of the procedure, as well as the risks, benefits, and alternatives were carefully and thoroughly reviewed with the patient. Ample time for discussion and questions allowed. The patient understood, was satisfied, and agreed to proceed.     HPI: Olivia Zamora is a 52 y.o. female who presents for EGD for history of grade C esophagitis. She has been fairly good about taking her medications. Her N&V and epigastric ab pain have now resolved.   Past Medical History:  Diagnosis Date   Anorexia    Anxiety    Failed therapy with Paxil, Lexparo, Prozac, Wellbutrin, Xanax, and Ativan   Arthritis    Asthma    Bulimia    Cardiac disease    Angina   Chronic back pain    Constipation 08/26/2019   Depression    Failed therapy with Paxil, Lexparo, Prozac, Wellbutrin, and Ativan   Disease of thyroid gland 12/28/2011   multiple thyroid nodules in 2013 - [place] on 03/08/14 showed heterogeneous appearance of thyroid gland without focal nodule   Eczema    Generalized seizure (HCC)    stress related   GERD (gastroesophageal reflux disease)    History of DVT (deep vein thrombosis) 1988   Hypertension    Hyperthyroidism    Hypothyroidism    IBS (irritable bowel syndrome)    Internal derangement of left knee    Lactose intolerance    Migraine    Sleep apnea    wears CPAP   Vitamin D deficiency     Past Surgical History:  Procedure Laterality Date   CESAREAN SECTION     x3   COLONOSCOPY     HEMANGIOMA EXCISION Right    shoulder   IR RADIOLOGIST EVAL & MGMT  05/04/2021   MASS EXCISION Right    lump from palm of hand   OOPHORECTOMY     OTHER SURGICAL HISTORY Right    Repair of tib-fib fracture   TOTAL ABDOMINAL HYSTERECTOMY     TOTAL  KNEE ARTHROPLASTY Left 06/04/2023   Procedure: LEFT TOTAL KNEE ARTHROPLASTY;  Surgeon: Marcene Corning, MD;  Location: [facility];  Service: Orthopedics;  Laterality: Left;   TUBAL LIGATION     TUMOR EXCISION Right    Axilla - benign   VASCULAR SURGERY Right 1989   blood clot removed from neck    Prior to Admission medications   Medication Sig Start Date End Date Taking? Authorizing Provider  buPROPion (WELLBUTRIN XL) 150 MG 24 hr tablet Take 1 tablet (150 mg total) by mouth daily. 11/12/23  Yes Elsie Lincoln, MD  desvenlafaxine (PRISTIQ) 50 MG 24 hr tablet Take 1 tablet (50 mg total) by mouth daily. 11/12/23  Yes Elsie Lincoln, MD  levalbuterol (XOPENEX) 0.63 MG/3ML nebulizer solution TAKE 3 MLS BY NEBULIZATION EVERY 4 HOURS AS NEEDED FOR WHEEZING, SHORTNESS OF BREATH OR COUGH FITS 11/11/23   Ellamae Sia, DO  losartan (COZAAR) 50 MG tablet TAKE 1 TABLET DAILY *CAMBER 09/09/23  Yes Rakes, Doralee Albino, FNP  Multiple Vitamins-Minerals (MULTIVITAMIN ADULT PO) Take 1 tablet by mouth daily.   Yes [provider]  pantoprazole (PROTONIX) 40 MG tablet Take 1 tablet (40 mg total) by mouth 2 (two) times daily. 09/25/23  Yes  Imogene Burn, MD  SYNTHROID 100 MCG tablet Take 100 mcg by mouth daily at 6 (six) AM. 12/11/20  Yes [provider]  aspirin EC 81 MG tablet Take 1 tablet (81 mg total) by mouth 2 (two) times daily. For 2 weeks then back to once a day for DVT prevention. Patient not taking: Reported on 11/12/2023 06/04/23   Elodia Florence, PA-C  Cyanocobalamin 5000 MCG/ML LIQD Place under the tongue. Patient not taking: Reported on 11/25/2023    [provider]  docusate sodium (COLACE) 100 MG capsule Take 1 capsule (100 mg total) by mouth daily. Patient taking differently: Take 100 mg by mouth 2 (two) times daily. 06/20/22   Gwenlyn Fudge, FNP  fluticasone (FLONASE) 50 MCG/ACT nasal spray Place 1-2 sprays into both nostrils daily as needed (nasal congestion). 09/03/23   Ellamae Sia, DO  Fremanezumab-vfrm (AJOVY) 225 MG/1.5ML SOAJ INJECT 1.5ML INTO THE SKIN EVERY 28 DAYS 11/12/23   Penumalli, Glenford Bayley, MD  levalbuterol [facility] HFA) 45 MCG/ACT inhaler Inhale 2 puffs into the lungs every 4 (four) hours as needed for wheezing or shortness of breath (coughing fits). 11/04/23   Mechele Claude, MD  linaclotide Karlene Einstein) 72 MCG capsule Take 1 capsule (72 mcg total) by mouth daily before breakfast. 05/10/23   Rakes, Doralee Albino, FNP  Rimegepant Sulfate (NURTEC) 75 MG TBDP Take 1 tablet (75 mg total) by mouth as needed (migraine). 06/24/23   Penumalli, Glenford Bayley, MD  sucralfate (CARAFATE) 1 GM/10ML suspension Take 10 mLs (1 g total) by mouth 4 (four) times daily. 09/25/23 11/14/23  Imogene Burn, MD  Tiotropium Bromide Monohydrate (SPIRIVA RESPIMAT) 1.25 MCG/ACT AERS INHALE 2 PUFFS INTO THE LUNGS ONCE DAILY 11/22/23   Sonny Masters, FNP  Vitamin D, Ergocalciferol, (DRISDOL) 1.25 MG (50000 UT) CAPS capsule Take 50,000 Units by mouth every 7 (seven) days.    [provider]    Current Outpatient Medications  Medication Sig Dispense Refill   buPROPion (WELLBUTRIN XL) 150 MG 24 hr tablet Take 1 tablet (150 mg total) by mouth daily. 30 tablet 0   desvenlafaxine (PRISTIQ) 50 MG 24 hr tablet Take 1 tablet (50 mg total) by mouth daily. 30 tablet 1   levalbuterol (XOPENEX) 0.63 MG/3ML nebulizer solution TAKE 3 MLS BY NEBULIZATION EVERY 4 HOURS AS NEEDED FOR WHEEZING, SHORTNESS OF BREATH OR COUGH FITS 90 mL 1   losartan (COZAAR) 50 MG tablet TAKE 1 TABLET DAILY *CAMBER 90 tablet 0   Multiple Vitamins-Minerals (MULTIVITAMIN ADULT PO) Take 1 tablet by mouth daily.     pantoprazole (PROTONIX) 40 MG tablet Take 1 tablet (40 mg total) by mouth 2 (two) times daily. 90 tablet 3   SYNTHROID 100 MCG tablet Take 100 mcg by mouth daily at 6 (six) AM.     aspirin EC 81 MG tablet Take 1 tablet (81 mg total) by mouth 2 (two) times daily. For 2 weeks then back to once a day for DVT prevention.  (Patient not taking: Reported on 11/12/2023) 30 tablet 11   Cyanocobalamin 5000 MCG/ML LIQD Place under the tongue. (Patient not taking: Reported on 11/25/2023)     docusate sodium (COLACE) 100 MG capsule Take 1 capsule (100 mg total) by mouth daily. (Patient taking differently: Take 100 mg by mouth 2 (two) times daily.) 90 capsule 1   fluticasone (FLONASE) 50 MCG/ACT nasal spray Place 1-2 sprays into both nostrils daily as needed (nasal congestion). 16 g 3   Fremanezumab-vfrm (AJOVY) 225 MG/1.5ML SOAJ  INJECT 1.5ML INTO THE SKIN EVERY 28 DAYS 1.5 mL 1   levalbuterol (XOPENEX HFA) 45 MCG/ACT inhaler Inhale 2 puffs into the lungs every 4 (four) hours as needed for wheezing or shortness of breath (coughing fits). 3 each 3   linaclotide (LINZESS) 72 MCG capsule Take 1 capsule (72 mcg total) by mouth daily before breakfast. 90 capsule 2   Rimegepant Sulfate (NURTEC) 75 MG TBDP Take 1 tablet (75 mg total) by mouth as needed (migraine). 8 tablet 12   sucralfate (CARAFATE) 1 GM/10ML suspension Take 10 mLs (1 g total) by mouth 4 (four) times daily. 420 mL 1   Tiotropium Bromide Monohydrate (SPIRIVA RESPIMAT) 1.25 MCG/ACT AERS INHALE 2 PUFFS INTO THE LUNGS ONCE DAILY 4 g 10   Vitamin D, Ergocalciferol, (DRISDOL) 1.25 MG (50000 UT) CAPS capsule Take 50,000 Units by mouth every 7 (seven) days.     Current Facility-Administered Medications  Medication Dose Route Frequency Provider Last Rate Last Admin   0.9 %  sodium chloride infusion  500 mL Intravenous Continuous Imogene Burn, MD        Allergies as of 11/25/2023 - Review Complete 11/25/2023  Allergen Reaction Noted   Iodine Anaphylaxis 08/19/2019   Iohexol Anaphylaxis 07/30/2021   Latex Anaphylaxis and Other (See Comments) 08/19/2019   Topamax [topiramate] Anaphylaxis 08/19/2019   Zanaflex [tizanidine] Shortness Of Breath 06/24/2023    Family History  Problem Relation Age of Onset   Arthritis Mother    Hypertension Mother    Thyroid disease  Mother    Depression Mother    Migraines Mother    Deep vein thrombosis Mother        in her leg   Arthritis Father    Hypertension Father    Depression Father    CVA Father    Factor V Leiden deficiency Sister    CVA Sister        30s   Thyroid disease Sister    Breast cancer Sister    Multiple sclerosis Sister    Other Sister        guillain barre   Depression Sister    Migraines Sister    CVA Sister    Thyroid disease Sister    Depression Sister    Migraines Sister    Hypertension Maternal Grandmother    Depression Maternal Grandmother    Anxiety disorder Maternal Grandmother    Hypertension Maternal Grandfather    Hypertension Paternal Grandmother    Hypertension Paternal Grandfather    Asthma Daughter    Depression Daughter    Anxiety disorder Daughter    Irritable bowel syndrome Daughter    Lactose intolerance Daughter    Migraines Daughter    Asthma Son    Depression Son    Anxiety disorder Son    Irritable bowel syndrome Son    Lactose intolerance Son    Migraines Son    Asthma Son    Depression Son    Anxiety disorder Son    Irritable bowel syndrome Son    Lactose intolerance Son    Migraines Son    Asthma Niece    Other Niece        antiphospholipid AB syndrome   Colon cancer Neg Hx    Esophageal cancer Neg Hx    Rectal cancer Neg Hx    Stomach cancer Neg Hx     Social History   Socioeconomic History   Marital status: Married    Spouse name: Not on file  Number of children: 3   Years of education: Not on file   Highest education level: Associate degree: occupational, Scientist, product/process development, or vocational program  Occupational History   Occupation: Retired Charity fundraiser  Tobacco Use   Smoking status: Former    Types: Cigarettes   Smokeless tobacco: Never   Tobacco comments:    Smoked when she was 13 for about a week- 09/03/23  Vaping Use   Vaping status: Never Used  Substance and Sexual Activity   Alcohol use: Never   Drug use: Never   Sexual activity:  Not on file  Other Topics Concern   Not on file  Social History Narrative   Not on file   Social Drivers of Health   Financial Resource Strain: Low Risk  (09/30/2023)   Overall Financial Resource Strain (CARDIA)    Difficulty of Paying Living Expenses: Not hard at all  Food Insecurity: No Food Insecurity (09/30/2023)   Hunger Vital Sign    Worried About Running Out of Food in the Last Year: Never true    Ran Out of Food in the Last Year: Never true  Transportation Needs: No Transportation Needs (09/30/2023)   PRAPARE - Administrator, Civil Service (Medical): No    Lack of Transportation (Non-Medical): No  Physical Activity: Sufficiently Active (09/30/2023)   Exercise Vital Sign    Days of Exercise per Week: 5 days    Minutes of Exercise per Session: 30 min  Stress: No Stress Concern Present (09/30/2023)   [employer] of Occupational Health - Occupational Stress Questionnaire    Feeling of Stress : Not at all  Social Connections: Moderately Integrated (09/30/2023)   Social Connection and Isolation Panel [NHANES]    Frequency of Communication with Friends and Family: More than three times a week    Frequency of Social Gatherings with Friends and Family: Once a week    Attends Religious Services: 1 to 4 times per year    Active Member of [employer] or Organizations: No    Attends Engineer, structural: Not on file    Marital Status: Married  Catering manager Violence: Not on file    Physical Exam: Vital signs in last 24 hours: BP 120/78   Pulse 80   Temp 97.7 F (36.5 C)   Ht 5\' 7"  (1.702 m)   Wt 179 lb (81.2 kg)   SpO2 97%   BMI 28.04 kg/m  GEN: NAD EYE: Sclerae anicteric ENT: MMM CV: Non-tachycardic Pulm: No increased work of breathing GI: Soft, NT/ND NEURO:  Alert & Oriented   Eulah Pont, MD [facility]  11/25/2023 8:15 AM

## 2023-11-25 NOTE — Progress Notes (Signed)
Pt's states no medical or surgical changes since previsit or office visit. 

## 2023-11-26 ENCOUNTER — Telehealth: Payer: Self-pay

## 2023-11-26 ENCOUNTER — Ambulatory Visit (INDEPENDENT_AMBULATORY_CARE_PROVIDER_SITE_OTHER): Payer: BLUE CROSS/BLUE SHIELD | Admitting: Family Medicine

## 2023-11-26 VITALS — BP 121/81 | HR 75 | Temp 97.5°F | Ht 67.0 in | Wt 182.6 lb

## 2023-11-26 DIAGNOSIS — E039 Hypothyroidism, unspecified: Secondary | ICD-10-CM | POA: Diagnosis not present

## 2023-11-26 DIAGNOSIS — R3129 Other microscopic hematuria: Secondary | ICD-10-CM

## 2023-11-26 DIAGNOSIS — E559 Vitamin D deficiency, unspecified: Secondary | ICD-10-CM

## 2023-11-26 DIAGNOSIS — R7303 Prediabetes: Secondary | ICD-10-CM | POA: Insufficient documentation

## 2023-11-26 DIAGNOSIS — E782 Mixed hyperlipidemia: Secondary | ICD-10-CM

## 2023-11-26 DIAGNOSIS — F509 Eating disorder, unspecified: Secondary | ICD-10-CM | POA: Insufficient documentation

## 2023-11-26 LAB — URINALYSIS, ROUTINE W REFLEX MICROSCOPIC
Bilirubin, UA: NEGATIVE
Glucose, UA: NEGATIVE
Ketones, UA: NEGATIVE
Nitrite, UA: NEGATIVE
Protein,UA: NEGATIVE
RBC, UA: NEGATIVE
Specific Gravity, UA: 1.025 (ref 1.005–1.030)
Urobilinogen, Ur: 0.2 mg/dL (ref 0.2–1.0)
pH, UA: 5.5 (ref 5.0–7.5)

## 2023-11-26 LAB — MICROSCOPIC EXAMINATION
RBC, Urine: NONE SEEN /[HPF] (ref 0–2)
Renal Epithel, UA: NONE SEEN /[HPF]
Yeast, UA: NONE SEEN

## 2023-11-26 NOTE — Progress Notes (Signed)
Subjective:  [name], female    DOB: 1971-10-06, 52 y.o.   MRN: [id number]  Patient Care Team: Sonny Masters, FNP as PCP - General (Family Medicine)   Chief Complaint:  Medical Management of Chronic Issues   HPI: Olivia Zamora is a 52 y.o. female presenting on 11/26/2023 for Medical Management of Chronic Issues   Discussed the use of AI scribe software for clinical note transcription with the patient, who gave verbal consent to proceed.  History of Present Illness   The patient, with a history of prediabetes and insomnia, presents for a routine check-up. She reports no increased thirst, hunger, or urination, which are common symptoms of diabetes. However, she mentions feeling thirstier after a recent medication adjustment by her psychiatrist. The psychiatrist decreased her Wellbutrin to 150mg  and increased her Pristiq to 50mg . Since the adjustment, the patient reports increased daytime sleepiness and fatigue, but is unsure if this is a side effect of the medication change. She has been on the new regimen for two weeks.  The patient also has a history of anorexia, which is currently in remission. She expresses frustration that the psychiatrist focused on this aspect of her medical history during her consultation for insomnia. She denies any recent changes in her eating habits or weight.  The patient has not been taking her prescribed Vitamin D supplement for the past two months and reports feeling the effects, such as splitting nails and muscle cramps. She was also advised by an endocrinologist to take a Vitamin B12 supplement, but has not started this yet. She expresses a preference for only taking medications that have been directly recommended by her primary care provider.       Relevant past medical, surgical, family, and social history reviewed and updated as indicated.  Allergies and medications reviewed and updated. Data reviewed: Chart in Epic.   Past  Medical History:  Diagnosis Date   Anorexia    Anxiety    Failed therapy with Paxil, Lexparo, Prozac, Wellbutrin, Xanax, and Ativan   Arthritis    Asthma    Bulimia    Cardiac disease    Angina   Chronic back pain    Constipation 08/26/2019   Depression    Failed therapy with Paxil, Lexparo, Prozac, Wellbutrin, and Ativan   Disease of thyroid gland 12/28/2011   multiple thyroid nodules in 2013 - [place] on 03/08/14 showed heterogeneous appearance of thyroid gland without focal nodule   Eczema    Generalized seizure (HCC)    stress related   GERD (gastroesophageal reflux disease)    History of DVT (deep vein thrombosis) 1988   Hypertension    Hyperthyroidism    Hypothyroidism    IBS (irritable bowel syndrome)    Internal derangement of left knee    Lactose intolerance    Migraine    Sleep apnea    wears CPAP   Vitamin D deficiency     Past Surgical History:  Procedure Laterality Date   CESAREAN SECTION     x3   COLONOSCOPY     HEMANGIOMA EXCISION Right    shoulder   IR RADIOLOGIST EVAL & MGMT  05/04/2021   MASS EXCISION Right    lump from palm of hand   OOPHORECTOMY     OTHER SURGICAL HISTORY Right    Repair of tib-fib fracture   TOTAL ABDOMINAL HYSTERECTOMY     TOTAL KNEE ARTHROPLASTY Left 06/04/2023   Procedure: LEFT TOTAL KNEE ARTHROPLASTY;  Surgeon:  Marcene Corning, MD;  Location: [facility];  Service: Orthopedics;  Laterality: Left;   TUBAL LIGATION     TUMOR EXCISION Right    Axilla - benign   VASCULAR SURGERY Right 1989   blood clot removed from neck    Social History   Socioeconomic History   Marital status: Married    Spouse name: Not on file   Number of children: 3   Years of education: Not on file   Highest education level: Associate degree: occupational, Scientist, product/process development, or vocational program  Occupational History   Occupation: Retired Charity fundraiser  Tobacco Use   Smoking status: Former    Types: Cigarettes   Smokeless tobacco: Never   Tobacco comments:    Smoked  when she was 13 for about a week- 09/03/23  Vaping Use   Vaping status: Never Used  Substance and Sexual Activity   Alcohol use: Never   Drug use: Never   Sexual activity: Not on file  Other Topics Concern   Not on file  Social History Narrative   Not on file   Social Drivers of Health   Financial Resource Strain: Low Risk  (11/26/2023)   Overall Financial Resource Strain (CARDIA)    Difficulty of Paying Living Expenses: Not hard at all  Food Insecurity: No Food Insecurity (11/26/2023)   Hunger Vital Sign    Worried About Running Out of Food in the Last Year: Never true    Ran Out of Food in the Last Year: Never true  Transportation Needs: No Transportation Needs (11/26/2023)   PRAPARE - Administrator, Civil Service (Medical): No    Lack of Transportation (Non-Medical): No  Physical Activity: Insufficiently Active (11/26/2023)   Exercise Vital Sign    Days of Exercise per Week: 4 days    Minutes of Exercise per Session: 30 min  Stress: Stress Concern Present (11/26/2023)   [employer] of Occupational Health - Occupational Stress Questionnaire    Feeling of Stress : To some extent  Social Connections: Moderately Integrated (11/26/2023)   Social Connection and Isolation Panel [NHANES]    Frequency of Communication with Friends and Family: More than three times a week    Frequency of Social Gatherings with Friends and Family: Three times a week    Attends Religious Services: 1 to 4 times per year    Active Member of Clubs or Organizations: No    Attends Banker Meetings: Not on file    Marital Status: Married  Intimate Partner Violence: Not on file    Outpatient Encounter Medications as of 11/26/2023  Medication Sig   buPROPion (WELLBUTRIN XL) 150 MG 24 hr tablet Take 1 tablet (150 mg total) by mouth daily.   desvenlafaxine (PRISTIQ) 50 MG 24 hr tablet Take 1 tablet (50 mg total) by mouth daily.   docusate sodium (COLACE) 100 MG capsule Take  1 capsule (100 mg total) by mouth daily. (Patient taking differently: Take 100 mg by mouth 2 (two) times daily.)   fluticasone (FLONASE) 50 MCG/ACT nasal spray Place 1-2 sprays into both nostrils daily as needed (nasal congestion).   Fremanezumab-vfrm (AJOVY) 225 MG/1.5ML SOAJ INJECT 1.5ML INTO THE SKIN EVERY 28 DAYS   levalbuterol (XOPENEX HFA) 45 MCG/ACT inhaler Inhale 2 puffs into the lungs every 4 (four) hours as needed for wheezing or shortness of breath (coughing fits).   levalbuterol (XOPENEX) 0.63 MG/3ML nebulizer solution TAKE 3 MLS BY NEBULIZATION EVERY 4 HOURS AS NEEDED FOR WHEEZING, SHORTNESS OF BREATH OR  COUGH FITS   linaclotide (LINZESS) 72 MCG capsule Take 1 capsule (72 mcg total) by mouth daily before breakfast.   losartan (COZAAR) 50 MG tablet TAKE 1 TABLET DAILY *CAMBER   Multiple Vitamins-Minerals (MULTIVITAMIN ADULT PO) Take 1 tablet by mouth daily.   pantoprazole (PROTONIX) 40 MG tablet Take 1 tablet (40 mg total) by mouth 2 (two) times daily.   Rimegepant Sulfate (NURTEC) 75 MG TBDP Take 1 tablet (75 mg total) by mouth as needed (migraine).   SYNTHROID 100 MCG tablet Take 100 mcg by mouth daily at 6 (six) AM.   Tiotropium Bromide Monohydrate (SPIRIVA RESPIMAT) 1.25 MCG/ACT AERS INHALE 2 PUFFS INTO THE LUNGS ONCE DAILY   aspirin EC 81 MG tablet Take 1 tablet (81 mg total) by mouth 2 (two) times daily. For 2 weeks then back to once a day for DVT prevention. (Patient not taking: Reported on 11/26/2023)   Cyanocobalamin 5000 MCG/ML LIQD Place under the tongue. (Patient not taking: Reported on 11/14/2023)   sucralfate (CARAFATE) 1 GM/10ML suspension Take 10 mLs (1 g total) by mouth 4 (four) times daily.   Vitamin D, Ergocalciferol, (DRISDOL) 1.25 MG (50000 UT) CAPS capsule Take 50,000 Units by mouth every 7 (seven) days. (Patient not taking: Reported on 11/26/2023)   No facility-administered encounter medications on file as of 11/26/2023.    Allergies  Allergen Reactions    Iodine Anaphylaxis   Iohexol Anaphylaxis    Pre meds given in past with no reaction noted.     Latex Anaphylaxis and Other (See Comments)   Topamax [Topiramate] Anaphylaxis   Zanaflex [Tizanidine] Shortness Of Breath    Swelling, chest pain     Pertinent ROS per HPI, otherwise unremarkable      Objective:  BP 121/81   Pulse 75   Temp (!) 97.5 F (36.4 C)   Ht 5\' 7"  (1.702 m)   Wt 182 lb 9.6 oz (82.8 kg)   SpO2 97%   BMI 28.60 kg/m    Wt Readings from Last 3 Encounters:  11/26/23 182 lb 9.6 oz (82.8 kg)  11/25/23 179 lb (81.2 kg)  11/14/23 180 lb (81.6 kg)    Physical Exam Vitals and nursing note reviewed.  Constitutional:      General: She is not in acute distress.    Appearance: Normal appearance. She is not ill-appearing, toxic-appearing or diaphoretic.  HENT:     Head: Normocephalic and atraumatic.     Mouth/Throat:     Mouth: Mucous membranes are moist.     Pharynx: Oropharynx is clear.  Eyes:     Conjunctiva/sclera: Conjunctivae normal.     Pupils: Pupils are equal, round, and reactive to light.  Cardiovascular:     Rate and Rhythm: Normal rate and regular rhythm.     Heart sounds: Normal heart sounds.  Pulmonary:     Effort: Pulmonary effort is normal.     Breath sounds: Normal breath sounds.  Abdominal:     Palpations: Abdomen is soft.  Musculoskeletal:     Cervical back: Normal range of motion and neck supple.     Right lower leg: No edema.     Left lower leg: No edema.  Skin:    General: Skin is warm and dry.     Capillary Refill: Capillary refill takes less than 2 seconds.  Neurological:     General: No focal deficit present.     Mental Status: She is alert and oriented to person, place, and time.  Psychiatric:  Mood and Affect: Mood normal.        Behavior: Behavior normal.        Thought Content: Thought content normal.        Judgment: Judgment normal.     Results for orders placed or performed in visit on 11/26/23  Microscopic  Examination   Collection Time: 11/26/23  3:08 PM   Urine  Result Value Ref Range   WBC, UA 0-5 0 - 5 /hpf   RBC, Urine None seen 0 - 2 /hpf   Epithelial Cells (non renal) 0-10 0 - 10 /hpf   Renal Epithel, UA None seen None seen /hpf   Bacteria, UA Few (A) None seen/Few   Yeast, UA None seen None seen  Urinalysis, Routine w reflex microscopic   Collection Time: 11/26/23  3:08 PM  Result Value Ref Range   Specific Gravity, UA 1.025 1.005 - 1.030   pH, UA 5.5 5.0 - 7.5   Color, UA Yellow Yellow   Appearance Ur Clear Clear   Leukocytes,UA Trace (A) Negative   Protein,UA Negative Negative/Trace   Glucose, UA Negative Negative   Ketones, UA Negative Negative   RBC, UA Negative Negative   Bilirubin, UA Negative Negative   Urobilinogen, Ur 0.2 0.2 - 1.0 mg/dL   Nitrite, UA Negative Negative   Microscopic Examination See below:        Pertinent labs & imaging results that were available during my care of the patient were reviewed by me and considered in my medical decision making.  Assessment & Plan:  Mckell was seen today for medical management of chronic issues.  Diagnoses and all orders for this visit:  Prediabetes -     CMP14+EGFR -     Lipid panel -     Thyroid Panel With TSH -     Anemia Profile B  Microscopic hematuria -     Urinalysis, Routine w reflex microscopic -     Microscopic Examination  Mixed hyperlipidemia -     CMP14+EGFR -     Lipid panel  Acquired hypothyroidism -     Thyroid Panel With TSH  Eating disorder in remission -     CMP14+EGFR -     Thyroid Panel With TSH -     Anemia Profile B -     Folate -     Vitamin B1 -     Magnesium  Vitamin D deficiency -     VITAMIN D 25 Hydroxy (Vit-D Deficiency, Fractures)     Assessment and Plan    Prediabetes A1c was 5.7% in October, indicating prediabetes. No significant symptoms of diabetes such as polydipsia, polyphagia, or polyuria. Plan to recheck A1c in March due to insurance coverage. -  Recheck A1c in March  Medication Adjustment Effects Recent psychiatric medication changes: decreased Wellbutrin to 150 mg and increased Pristiq to 50 mg. Reports increased fatigue and altered sleep patterns. Advised to allow 6-8 weeks for medication stabilization. Initial side effects may subside after 2 weeks, with improvement expected by 6-8 weeks. - Continue current medication regimen for 6-8 weeks - Monitor symptoms and follow up if issues persist  Vitamin D Deficiency Low vitamin D levels, previously managed with 50,000 IU weekly. Has not taken vitamin D for two months, reporting symptoms such as nail splitting and cramping. Vitamin D level to be checked today. - Check vitamin D levels - Restart vitamin D supplementation if levels are low  Hyperlipidemia Cholesterol levels are being monitored. No recent values  discussed, but patient is concerned about cholesterol management. Will order cholesterol panel today. - Order cholesterol panel  General Health Maintenance Routine follow-up and blood work to monitor overall health, including glucose, electrolytes, and vitamin levels. Discussion about adherence to recommended supplements and medications. Will check folate, vitamin B1, B12, and anemia panel as per psychiatrist's focus on eating disorder history. - Order CMP to check glucose and electrolytes - Check folate, vitamin B1, B12, and anemia panel - Check magnesium levels - Follow up on endocrinologist's recommendation for B12 and vitamin D  Follow-up - Follow up in March for A1c recheck - Monitor lab results and adjust treatment as necessary.          Continue all other maintenance medications.  Follow up plan: Return if symptoms worsen or fail to improve.   Continue healthy lifestyle choices, including diet (rich in fruits, vegetables, and lean proteins, and low in salt and simple carbohydrates) and exercise (at least 30 minutes of moderate physical activity  daily).  Educational handout given for health maintenance   The above assessment and management plan was discussed with the patient. The patient verbalized understanding of and has agreed to the management plan. Patient is aware to call the clinic if they develop any new symptoms or if symptoms persist or worsen. Patient is aware when to return to the clinic for a follow-up visit. Patient educated on when it is appropriate to go to the emergency department.   Kari Baars, FNP-C [facility] [phone]

## 2023-11-26 NOTE — Telephone Encounter (Signed)
No answer, left message to call if having any issues or concerns, B.Lj Miyamoto RN 

## 2023-11-29 ENCOUNTER — Ambulatory Visit: Payer: BLUE CROSS/BLUE SHIELD | Admitting: Family Medicine

## 2023-12-02 LAB — ANEMIA PROFILE B
Basophils Absolute: 0 10*3/uL (ref 0.0–0.2)
Basos: 0 %
EOS (ABSOLUTE): 0.1 10*3/uL (ref 0.0–0.4)
Eos: 2 %
Ferritin: 226 ng/mL — ABNORMAL HIGH (ref 15–150)
Folate: >20 ng/mL (ref >3.0)
Hematocrit: 43.5 % (ref 34.0–46.6)
Hemoglobin: 14.4 g/dL (ref 11.1–15.9)
Immature Grans (Abs): 0 10*3/uL (ref 0.0–0.1)
Immature Granulocytes: 0 %
Iron Saturation: 48 % (ref 15–55)
Iron: 136 ug/dL (ref 27–159)
Lymphocytes Absolute: 1.3 10*3/uL (ref 0.7–3.1)
Lymphs: 29 %
MCH: 30.7 pg (ref 26.6–33.0)
MCHC: 33.1 g/dL (ref 31.5–35.7)
MCV: 93 fL (ref 79–97)
Monocytes Absolute: 0.3 10*3/uL (ref 0.1–0.9)
Monocytes: 7 %
Neutrophils Absolute: 2.7 10*3/uL (ref 1.4–7.0)
Neutrophils: 62 %
Platelets: 167 10*3/uL (ref 150–450)
RBC: 4.69 x10E6/uL (ref 3.77–5.28)
RDW: 12.4 % (ref 11.7–15.4)
Retic Ct Pct: 1.6 % (ref 0.6–2.6)
Total Iron Binding Capacity: 283 ug/dL (ref 250–450)
UIBC: 147 ug/dL (ref 131–425)
Vitamin B-12: 386 pg/mL (ref 232–1245)
WBC: 4.4 10*3/uL (ref 3.4–10.8)

## 2023-12-02 LAB — LIPID PANEL
Chol/HDL Ratio: 5.1 {ratio} — ABNORMAL HIGH (ref 0.0–4.4)
Cholesterol, Total: 202 mg/dL — ABNORMAL HIGH (ref 100–199)
HDL: 40 mg/dL (ref >39)
LDL Chol Calc (NIH): 143 mg/dL — ABNORMAL HIGH (ref 0–99)
Triglycerides: 105 mg/dL (ref 0–149)
VLDL Cholesterol Cal: 19 mg/dL (ref 5–40)

## 2023-12-02 LAB — CMP14+EGFR
ALT: 21 [IU]/L (ref 0–32)
AST: 19 [IU]/L (ref 0–40)
Albumin: 4.5 g/dL (ref 3.8–4.9)
Alkaline Phosphatase: 80 [IU]/L (ref 44–121)
BUN/Creatinine Ratio: 20 (ref 9–23)
BUN: 15 mg/dL (ref 6–24)
Bilirubin Total: 0.7 mg/dL (ref 0.0–1.2)
CO2: 24 mmol/L (ref 20–29)
Calcium: 9.5 mg/dL (ref 8.7–10.2)
Chloride: 104 mmol/L (ref 96–106)
Creatinine, Ser: 0.75 mg/dL (ref 0.57–1.00)
Globulin, Total: 1.9 g/dL (ref 1.5–4.5)
Glucose: 102 mg/dL — ABNORMAL HIGH (ref 70–99)
Potassium: 4.4 mmol/L (ref 3.5–5.2)
Sodium: 143 mmol/L (ref 134–144)
Total Protein: 6.4 g/dL (ref 6.0–8.5)
eGFR: 95 mL/min/{1.73_m2} (ref >59)

## 2023-12-02 LAB — THYROID PANEL WITH TSH
Free Thyroxine Index: 3.2 (ref 1.2–4.9)
T3 Uptake Ratio: 28 % (ref 24–39)
T4, Total: 11.4 ug/dL (ref 4.5–12.0)
TSH: 0.23 u[IU]/mL — ABNORMAL LOW (ref 0.450–4.500)

## 2023-12-02 LAB — VITAMIN D 25 HYDROXY (VIT D DEFICIENCY, FRACTURES): Vit D, 25-Hydroxy: 52.7 ng/mL (ref 30.0–100.0)

## 2023-12-02 LAB — VITAMIN B1: Thiamine: 133.8 nmol/L (ref 66.5–200.0)

## 2023-12-02 LAB — MAGNESIUM: Magnesium: 2.1 mg/dL (ref 1.6–2.3)

## 2023-12-04 ENCOUNTER — Other Ambulatory Visit (HOSPITAL_COMMUNITY): Payer: Self-pay | Admitting: Psychiatry

## 2023-12-04 DIAGNOSIS — F4001 Agoraphobia with panic disorder: Secondary | ICD-10-CM

## 2023-12-04 DIAGNOSIS — F4310 Post-traumatic stress disorder, unspecified: Secondary | ICD-10-CM

## 2023-12-04 DIAGNOSIS — F332 Major depressive disorder, recurrent severe without psychotic features: Secondary | ICD-10-CM

## 2023-12-04 DIAGNOSIS — F411 Generalized anxiety disorder: Secondary | ICD-10-CM

## 2023-12-05 ENCOUNTER — Telehealth: Payer: Self-pay | Admitting: Family Medicine

## 2023-12-05 ENCOUNTER — Encounter (HOSPITAL_COMMUNITY): Payer: Self-pay

## 2023-12-05 NOTE — Telephone Encounter (Signed)
Copied from CRM [id number]. Topic: Clinical - Prescription Issue >> Dec 05, 2023 12:24 PM Olivia Zamora wrote: Reason for CRM: The patient is unsure if she is supposed to stop or take buPROPion (WELLBUTRIN XL) 150 MG 24 hr tablet as it was discontinued by the psychiatrist but was then refilled. She is requesting a call back at [phone].

## 2023-12-06 ENCOUNTER — Other Ambulatory Visit (HOSPITAL_COMMUNITY): Payer: Self-pay

## 2023-12-10 ENCOUNTER — Other Ambulatory Visit (HOSPITAL_COMMUNITY): Payer: Self-pay | Admitting: Psychiatry

## 2023-12-10 DIAGNOSIS — F332 Major depressive disorder, recurrent severe without psychotic features: Secondary | ICD-10-CM

## 2023-12-10 DIAGNOSIS — F411 Generalized anxiety disorder: Secondary | ICD-10-CM

## 2023-12-10 DIAGNOSIS — F4001 Agoraphobia with panic disorder: Secondary | ICD-10-CM

## 2023-12-10 DIAGNOSIS — F4310 Post-traumatic stress disorder, unspecified: Secondary | ICD-10-CM

## 2023-12-13 ENCOUNTER — Telehealth (HOSPITAL_COMMUNITY): Payer: BLUE CROSS/BLUE SHIELD | Admitting: Psychiatry

## 2023-12-13 ENCOUNTER — Encounter (HOSPITAL_COMMUNITY): Payer: Self-pay | Admitting: Psychiatry

## 2023-12-13 DIAGNOSIS — F411 Generalized anxiety disorder: Secondary | ICD-10-CM

## 2023-12-13 DIAGNOSIS — E559 Vitamin D deficiency, unspecified: Secondary | ICD-10-CM

## 2023-12-13 DIAGNOSIS — F3341 Major depressive disorder, recurrent, in partial remission: Secondary | ICD-10-CM | POA: Diagnosis not present

## 2023-12-13 DIAGNOSIS — G4709 Other insomnia: Secondary | ICD-10-CM

## 2023-12-13 DIAGNOSIS — F4001 Agoraphobia with panic disorder: Secondary | ICD-10-CM | POA: Diagnosis not present

## 2023-12-13 DIAGNOSIS — F4310 Post-traumatic stress disorder, unspecified: Secondary | ICD-10-CM

## 2023-12-13 DIAGNOSIS — F5104 Psychophysiologic insomnia: Secondary | ICD-10-CM

## 2023-12-13 MED ORDER — DESVENLAFAXINE SUCCINATE ER 50 MG PO TB24: 50.0000 mg | ORAL_TABLET | Freq: Every day | ORAL | 0 refills | Status: DC

## 2023-12-13 NOTE — Patient Instructions (Addendum)
 We did not make any medication changes today.  Keep up the good work with being consistent with your meals and sleep and I agree with the idea of using a pill counter of some kind to help maintain compliance with her other medications.  You may still want to consider getting a nutrition referral but with the improvement in your appetite this may not be necessary in the long run.
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
## 2024-01-13 ENCOUNTER — Telehealth (INDEPENDENT_AMBULATORY_CARE_PROVIDER_SITE_OTHER): Payer: BLUE CROSS/BLUE SHIELD | Admitting: Psychiatry

## 2024-01-13 ENCOUNTER — Encounter (HOSPITAL_COMMUNITY): Payer: Self-pay | Admitting: Psychiatry

## 2024-01-13 ENCOUNTER — Encounter: Payer: Self-pay | Admitting: Family Medicine

## 2024-01-13 DIAGNOSIS — F3341 Major depressive disorder, recurrent, in partial remission: Secondary | ICD-10-CM

## 2024-01-13 DIAGNOSIS — F4310 Post-traumatic stress disorder, unspecified: Secondary | ICD-10-CM | POA: Diagnosis not present

## 2024-01-13 DIAGNOSIS — G2581 Restless legs syndrome: Secondary | ICD-10-CM | POA: Diagnosis not present

## 2024-01-13 DIAGNOSIS — F4001 Agoraphobia with panic disorder: Secondary | ICD-10-CM

## 2024-01-13 DIAGNOSIS — F411 Generalized anxiety disorder: Secondary | ICD-10-CM

## 2024-01-13 NOTE — Patient Instructions (Signed)
We did not make any medication changes today.  Keep up the good work with self-care and try to be compliant with the medications that you need to take including the aspirin.
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
## 2024-01-16 ENCOUNTER — Other Ambulatory Visit: Payer: Self-pay | Admitting: Diagnostic Neuroimaging

## 2024-01-16 NOTE — Telephone Encounter (Signed)
 Pt called wanting to know when she can pick this up. She states that her headaches are bad and is in need of the shot. Please advise.

## 2024-01-20 ENCOUNTER — Other Ambulatory Visit: Payer: BLUE CROSS/BLUE SHIELD

## 2024-01-23 ENCOUNTER — Telehealth: Payer: Self-pay | Admitting: Pharmacist

## 2024-01-23 NOTE — Telephone Encounter (Signed)
Pharmacy Patient Advocate Encounter  Received notification from [facility] that Prior Authorization for Nurtec 75MG  dispersible tablets has been APPROVED from 01/23/2024 to 01/22/2025   PA #/Case ID/Reference #: [id number]

## 2024-02-13 ENCOUNTER — Other Ambulatory Visit: Payer: Self-pay | Admitting: Family Medicine

## 2024-02-13 DIAGNOSIS — Z0000 Encounter for general adult medical examination without abnormal findings: Secondary | ICD-10-CM

## 2024-02-13 DIAGNOSIS — I10 Essential (primary) hypertension: Secondary | ICD-10-CM

## 2024-02-14 ENCOUNTER — Encounter: Payer: Self-pay | Admitting: Family Medicine

## 2024-02-20 ENCOUNTER — Ambulatory Visit: Admitting: Family Medicine

## 2024-02-20 ENCOUNTER — Encounter: Payer: Self-pay | Admitting: Family Medicine

## 2024-02-20 DIAGNOSIS — F4001 Agoraphobia with panic disorder: Secondary | ICD-10-CM

## 2024-02-20 DIAGNOSIS — F4310 Post-traumatic stress disorder, unspecified: Secondary | ICD-10-CM | POA: Diagnosis not present

## 2024-02-20 DIAGNOSIS — F3341 Major depressive disorder, recurrent, in partial remission: Secondary | ICD-10-CM | POA: Diagnosis not present

## 2024-02-20 DIAGNOSIS — F411 Generalized anxiety disorder: Secondary | ICD-10-CM | POA: Diagnosis not present

## 2024-02-20 MED ORDER — DESVENLAFAXINE SUCCINATE ER 50 MG PO TB24: 50.0000 mg | ORAL_TABLET | Freq: Every day | ORAL | 1 refills | Status: DC
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
## 2024-02-20 NOTE — Patient Instructions (Signed)
 If your symptoms worsen or you have thoughts of suicide/homicide, PLEASE SEEK IMMEDIATE MEDICAL ATTENTION.  You may always call the National Suicide Hotline.  This is available 24 hours a day, 7 days a week.  Their number is: 5810725763  Taking the medicine as directed and not missing any doses is one of the best things you can do to treat your depression.  Here are some things to keep in mind:  Side effects (stomach upset, some increased anxiety) may happen before you notice a benefit.  These side effects typically go away over time. Changes to your dose of medicine or a change in medication all together is sometimes necessary Most people need to be on medication at least 12 months Many people will notice an improvement within two weeks but the full effect of the medication can take up to 4-6 weeks Stopping the medication when you start feeling better often results in a return of symptoms Never discontinue your medication without contacting a health care professional first.  Some medications require gradual discontinuation/ taper and can make you sick if you stop them abruptly.  If your symptoms worsen or you have thoughts of suicide/homicide, PLEASE SEEK IMMEDIATE MEDICAL ATTENTION.  You may always call:  National Suicide Hotline: 440-761-4568 [place] Crisis Line: (424)588-3357 Crisis Recovery in [place]: 331-882-2643  These are available 24 hours a day, 7 days a week.

## 2024-02-25 ENCOUNTER — Encounter: Payer: Self-pay | Admitting: Internal Medicine

## 2024-02-27 ENCOUNTER — Other Ambulatory Visit

## 2024-03-02 ENCOUNTER — Telehealth (HOSPITAL_COMMUNITY): Payer: BLUE CROSS/BLUE SHIELD | Admitting: Psychiatry

## 2024-03-04 ENCOUNTER — Encounter: Payer: BLUE CROSS/BLUE SHIELD | Admitting: Family Medicine

## 2024-03-30 ENCOUNTER — Telehealth: Payer: Self-pay

## 2024-03-30 ENCOUNTER — Other Ambulatory Visit (HOSPITAL_COMMUNITY): Payer: Self-pay

## 2024-03-30 NOTE — Telephone Encounter (Signed)
 Clinical questions have been answered and PA submitted. PA currently Pending. Please be advised that most companies allow up to 30 days to make a decision. We will advise when a determination has been made, or follow up in 1 week.   Please reach out to our team, Rx Prior Auth Pool, if you haven't heard back in a week.

## 2024-03-30 NOTE — Telephone Encounter (Signed)
 Pharmacy Patient Advocate Encounter   Received notification from CoverMyMeds that prior authorization for AJOVY  (fremanezumab -vfrm) injection 225MG /1.5ML auto-injectors is required/requested.   Insurance verification completed.   The patient is insured through [facility] .   Per test claim: PA required; PA started via CoverMyMeds. KEY [id number] . Waiting for clinical questions to populate.

## 2024-03-31 ENCOUNTER — Other Ambulatory Visit (HOSPITAL_COMMUNITY): Payer: Self-pay

## 2024-03-31 NOTE — Telephone Encounter (Signed)
 Pharmacy Patient Advocate Encounter  Received notification from [facility] that Prior Authorization for AJOVY  (fremanezumab -vfrm) injection 225MG /1.5ML auto-injectors has been APPROVED from 03/30/2024 to 03/30/2025   PA #/Case ID/Reference #: PA Case ID #: [id number]

## 2024-04-06 ENCOUNTER — Telehealth (HOSPITAL_COMMUNITY): Admitting: Psychiatry

## 2024-05-06 ENCOUNTER — Other Ambulatory Visit: Payer: Self-pay | Admitting: Family Medicine

## 2024-05-06 DIAGNOSIS — Z0000 Encounter for general adult medical examination without abnormal findings: Secondary | ICD-10-CM

## 2024-05-06 DIAGNOSIS — I10 Essential (primary) hypertension: Secondary | ICD-10-CM

## 2024-05-21 ENCOUNTER — Ambulatory Visit: Admitting: Gastroenterology

## 2024-05-22 ENCOUNTER — Ambulatory Visit (INDEPENDENT_AMBULATORY_CARE_PROVIDER_SITE_OTHER): Admitting: Family Medicine

## 2024-05-22 ENCOUNTER — Ambulatory Visit: Payer: Self-pay | Admitting: Family Medicine

## 2024-05-22 ENCOUNTER — Encounter: Payer: Self-pay | Admitting: Family Medicine

## 2024-05-22 VITALS — BP 111/78 | HR 80 | Temp 97.6°F | Ht 67.0 in | Wt 193.4 lb

## 2024-05-22 DIAGNOSIS — R7303 Prediabetes: Secondary | ICD-10-CM

## 2024-05-22 DIAGNOSIS — R202 Paresthesia of skin: Secondary | ICD-10-CM

## 2024-05-22 DIAGNOSIS — F411 Generalized anxiety disorder: Secondary | ICD-10-CM

## 2024-05-22 DIAGNOSIS — F332 Major depressive disorder, recurrent severe without psychotic features: Secondary | ICD-10-CM

## 2024-05-22 DIAGNOSIS — E039 Hypothyroidism, unspecified: Secondary | ICD-10-CM

## 2024-05-22 DIAGNOSIS — Z119 Encounter for screening for infectious and parasitic diseases, unspecified: Secondary | ICD-10-CM | POA: Diagnosis not present

## 2024-05-22 DIAGNOSIS — F3341 Major depressive disorder, recurrent, in partial remission: Secondary | ICD-10-CM

## 2024-05-22 DIAGNOSIS — E559 Vitamin D deficiency, unspecified: Secondary | ICD-10-CM

## 2024-05-22 DIAGNOSIS — Z1231 Encounter for screening mammogram for malignant neoplasm of breast: Secondary | ICD-10-CM

## 2024-05-22 DIAGNOSIS — Z0000 Encounter for general adult medical examination without abnormal findings: Secondary | ICD-10-CM

## 2024-05-22 DIAGNOSIS — Z0001 Encounter for general adult medical examination with abnormal findings: Secondary | ICD-10-CM | POA: Diagnosis not present

## 2024-05-22 DIAGNOSIS — E782 Mixed hyperlipidemia: Secondary | ICD-10-CM

## 2024-05-22 LAB — BAYER DCA HB A1C WAIVED: HB A1C (BAYER DCA - WAIVED): 5.3 % (ref 4.8–5.6)

## 2024-05-22 MED ORDER — DESVENLAFAXINE SUCCINATE ER 25 MG PO TB24
25.0000 mg | ORAL_TABLET | Freq: Every day | ORAL | 1 refills | Status: DC
Start: 2024-05-22 — End: 2024-08-05

## 2024-05-22 NOTE — Progress Notes (Signed)
 Complete physical exam  Patient: Olivia Zamora   DOB: 1971-10-06   53 y.o. Female  MRN: [id number]  Subjective:    Chief Complaint  Patient presents with   Annual Exam    Olivia Zamora is a 53 y.o. female who presents today for a complete physical exam. She reports consuming a general diet. The patient does not participate in regular exercise at present. She generally feels well. She reports sleeping well. She does have additional problems to discuss today.   [name] is a 53 year old female who presents for an annual physical exam.  She has been adjusting her Pristiq  dosage due to experiencing chest pain and shortness of breath when the dosage was increased. She also felt 'spaced out and foggy' after starting therapy, which she attributed to the medication. After reducing the dosage, she feels great and notes that her symptoms have resolved, including not needing her inhaler during allergy season.  She mentions significant fatigue over the past month, along with joint pain, particularly in her hands, which sometimes prevents her from opening bottles. Her hands are 'constantly so cramped and numb' that she often drops dishes. She has been forgetful in taking her B12 supplements, which her endocrinologist recommended. She also notes muscle aches similar to post-exercise soreness, despite not having the energy to work out as much as usual.  She has been experiencing pain on her left side and shoulder, which she associates with her gallbladder. She canceled a follow-up appointment with the gastroenterologist. She continues to take Protonix  for reflux, which has been effective, although she has experienced some left-sided pain.  She has had her teeth pulled in preparation for dentures, which she feels has affected her speech. She is scheduled for further dental work later this month.  She reports being inconsistent with her bowel habits and attributes this to being forgetful and focused on  her children. She also mentions a recent change in her eyeglass prescription to bifocals, which she attributes to aging.  No significant changes in weight, night sweats, or changes in hearing or vision. She reports not drinking enough water  recently, which has affected her ability to have blood drawn easily.      Most recent fall risk assessment:    05/22/2024    9:11 AM  Fall Risk   Falls in the past year? 1  Number falls in past yr: 1  Injury with Fall? 1  Risk for fall due to : History of fall(s)  Follow up Falls evaluation completed     Most recent depression screenings:    05/22/2024    9:11 AM 02/20/2024    9:02 AM 11/26/2023    2:50 PM 10/04/2023   12:06 PM 09/10/2023   11:29 AM  Depression screen PHQ 2/9  Decreased Interest 0 0 1 0 1  Down, Depressed, Hopeless 0 0 1 0 1  PHQ - 2 Score 0 0 2 0 2  Altered sleeping 2 1 2 3 3   Tired, decreased energy 0 1 1 3 3   Change in appetite 0 0 1 3 2   Feeling bad or failure about yourself  0 0 0 0 0  Trouble concentrating 0 0 0 0 2  Moving slowly or fidgety/restless 0 0 0 0 1  Suicidal thoughts 0 0 0 0 0  PHQ-9 Score 2 2 6 9 13   Difficult doing work/chores Not difficult at all Not difficult at all Somewhat difficult Somewhat difficult Very difficult      05/22/2024  9:11 AM 02/20/2024    9:02 AM 11/26/2023    2:51 PM 10/04/2023   12:07 PM  GAD 7 : Generalized Anxiety Score  Nervous, Anxious, on Edge 0 0 1 0  Control/stop worrying 0 0 1 0  Worry too much - different things 0 0 1 0  Trouble relaxing 0 0 1 0  Restless 0 0 0 0  Easily annoyed or irritable 0 0 1 0  Afraid - awful might happen 0 0 0 0  Total GAD 7 Score 0 0 5 0  Anxiety Difficulty Not difficult at all Not difficult at all Somewhat difficult Not difficult at all      Vision:Within last year and Dental: No current dental problems and Receives regular dental care  Patient Active Problem List   Diagnosis Date Noted   Prediabetes 11/26/2023   Eating disorder  in remission 11/26/2023   History of anorexia nervosa 11/12/2023   History of bulimia nervosa with history of laxative use, diuretics, and purging 11/12/2023   Panic disorder with agoraphobia 11/12/2023   PTSD (post-traumatic stress disorder) 11/12/2023   Mixed hyperlipidemia 05/09/2023   Essential hypertension 11/21/2022   Restless legs 11/21/2022   Controlled substance agreement signed 11/21/2022   Acquired hypothyroidism 11/21/2022   AVM (arteriovenous malformation) 03/01/2022   H/O: CVA (cerebrovascular accident) 08/01/2021   History of DVT (deep vein thrombosis) 08/01/2021   Symptomatic mammary hypertrophy 06/13/2021   IBS (irritable bowel syndrome) 08/26/2019   Vitamin D  deficiency    Sleep apnea    Recurrent major depressive disorder in partial remission (HCC)    Generalized anxiety disorder    Asthma    Disease of thyroid  gland 12/28/2011   Past Medical History:  Diagnosis Date   Anorexia    Anxiety    Failed therapy with Paxil, Lexparo, Prozac, Wellbutrin , Xanax, and Ativan   Arthritis    Asthma    Bulimia    Cardiac disease    Angina   Chronic back pain    Constipation 08/26/2019   Depression    Failed therapy with Paxil, Lexparo, Prozac, Wellbutrin , and Ativan   Disease of thyroid  gland 12/28/2011   multiple thyroid  nodules in 2013 - US  on 03/08/14 showed heterogeneous appearance of thyroid  gland without focal nodule   Eczema    Generalized seizure (HCC)    stress related   GERD (gastroesophageal reflux disease)    History of DVT (deep vein thrombosis) 1988   Hypertension    Hyperthyroidism    Hypothyroidism    IBS (irritable bowel syndrome)    Internal derangement of left knee    Lactose intolerance    Migraine    Sleep apnea    wears CPAP   Vitamin D  deficiency    Past Surgical History:  Procedure Laterality Date   CESAREAN SECTION     x3   COLONOSCOPY     HEMANGIOMA EXCISION Right    shoulder   IR RADIOLOGIST EVAL & MGMT  05/04/2021   MASS  EXCISION Right    lump from palm of hand   OOPHORECTOMY     OTHER SURGICAL HISTORY Right    Repair of tib-fib fracture   TOTAL ABDOMINAL HYSTERECTOMY     TOTAL KNEE ARTHROPLASTY Left 06/04/2023   Procedure: LEFT TOTAL KNEE ARTHROPLASTY;  Surgeon: Dayne Even, MD;  Location: [facility];  Service: Orthopedics;  Laterality: Left;   TUBAL LIGATION     TUMOR EXCISION Right    Axilla - benign   VASCULAR SURGERY  Right 1989   blood clot removed from neck   Social History   Tobacco Use   Smoking status: Former    Types: Cigarettes   Smokeless tobacco: Never   Tobacco comments:    Smoked when she was 13 for about a week- 09/03/23  Vaping Use   Vaping status: Never Used  Substance Use Topics   Alcohol use: Never   Drug use: Never   Social History   Socioeconomic History   Marital status: Married    Spouse name: Not on file   Number of children: 3   Years of education: Not on file   Highest education level: Associate degree: occupational, Scientist, product/process development, or vocational program  Occupational History   Occupation: Retired Charity fundraiser  Tobacco Use   Smoking status: Former    Types: Cigarettes   Smokeless tobacco: Never   Tobacco comments:    Smoked when she was 13 for about a week- 09/03/23  Vaping Use   Vaping status: Never Used  Substance and Sexual Activity   Alcohol use: Never   Drug use: Never   Sexual activity: Not on file  Other Topics Concern   Not on file  Social History Narrative   Not on file   Social Drivers of Health   Financial Resource Strain: Low Risk  (05/21/2024)   Overall Financial Resource Strain (CARDIA)    Difficulty of Paying Living Expenses: Not hard at all  Food Insecurity: No Food Insecurity (05/21/2024)   Hunger Vital Sign    Worried About Running Out of Food in the Last Year: Never true    Ran Out of Food in the Last Year: Never true  Transportation Needs: No Transportation Needs (05/21/2024)   PRAPARE - Administrator, Civil Service (Medical):  No    Lack of Transportation (Non-Medical): No  Physical Activity: Sufficiently Active (05/21/2024)   Exercise Vital Sign    Days of Exercise per Week: 7 days    Minutes of Exercise per Session: 40 min  Stress: No Stress Concern Present (05/21/2024)   [employer] of Occupational Health - Occupational Stress Questionnaire    Feeling of Stress: Not at all  Social Connections: Moderately Integrated (05/21/2024)   Social Connection and Isolation Panel    Frequency of Communication with Friends and Family: More than three times a week    Frequency of Social Gatherings with Friends and Family: Once a week    Attends Religious Services: 1 to 4 times per year    Active Member of [employer] or Organizations: No    Attends Engineer, structural: Not on file    Marital Status: Married  Catering manager Violence: Not on file   Family Status  Relation Name Status   Mother  Alive   Father  Alive   Sister  Alive   Sister  Alive   MGM  Deceased   MGF  Deceased   PGM  Deceased   PGF  Deceased   Daughter  Alive   Son  Alive   Son  Alive   Niece  (Not Specified)   Neg Hx  (Not Specified)  No partnership data on file   Family History  Problem Relation Age of Onset   Arthritis Mother    Hypertension Mother    Thyroid  disease Mother    Depression Mother    Migraines Mother    Deep vein thrombosis Mother        in her leg   Arthritis Father  Hypertension Father    Depression Father    CVA Father    Factor V Leiden deficiency Sister    CVA Sister        30s   Thyroid  disease Sister    Breast cancer Sister    Multiple sclerosis Sister    Other Sister        guillain barre   Depression Sister    Migraines Sister    CVA Sister    Thyroid  disease Sister    Depression Sister    Migraines Sister    Hypertension Maternal Grandmother    Depression Maternal Grandmother    Anxiety disorder Maternal Grandmother    Hypertension Maternal Grandfather    Hypertension Paternal  Grandmother    Hypertension Paternal Grandfather    Asthma Daughter    Depression Daughter    Anxiety disorder Daughter    Irritable bowel syndrome Daughter    Lactose intolerance Daughter    Migraines Daughter    Asthma Son    Depression Son    Anxiety disorder Son    Irritable bowel syndrome Son    Lactose intolerance Son    Migraines Son    Asthma Son    Depression Son    Anxiety disorder Son    Irritable bowel syndrome Son    Lactose intolerance Son    Migraines Son    Asthma Niece    Other Niece        antiphospholipid AB syndrome   Colon cancer Neg Hx    Esophageal cancer Neg Hx    Rectal cancer Neg Hx    Stomach cancer Neg Hx    Allergies  Allergen Reactions   Iodine  Anaphylaxis   Iohexol  Anaphylaxis    Pre meds given in past with no reaction noted.     Latex Anaphylaxis and Other (See Comments)    latex   Topamax [Topiramate] Anaphylaxis   Zanaflex  [Tizanidine ] Shortness Of Breath    Swelling, chest pain       Patient Care Team: Galvin Jules, FNP as PCP - General (Family Medicine)   Outpatient Medications Prior to Visit  Medication Sig   Cyanocobalamin 5000 MCG/ML LIQD Place under the tongue.   docusate sodium  (COLACE) 100 MG capsule Take 1 capsule (100 mg total) by mouth daily. (Patient taking differently: Take 100 mg by mouth 2 (two) times daily.)   Fremanezumab -vfrm (AJOVY ) 225 MG/1.5ML SOAJ INJECT 1.5ML INTO THE SKIN EVERY 28 DAYS   furosemide (LASIX) 20 MG tablet Take 20 mg by mouth daily.   levalbuterol  (XOPENEX  HFA) 45 MCG/ACT inhaler Inhale 2 puffs into the lungs every 4 (four) hours as needed for wheezing or shortness of breath (coughing fits).   levalbuterol  (XOPENEX ) 0.63 MG/3ML nebulizer solution TAKE 3 MLS BY NEBULIZATION EVERY 4 HOURS AS NEEDED FOR WHEEZING, SHORTNESS OF BREATH OR COUGH FITS   linaclotide  (LINZESS ) 72 MCG capsule Take 1 capsule (72 mcg total) by mouth daily before breakfast.   losartan  (COZAAR ) 50 MG tablet TAKE 1 TABLET  DAILY        *CAMBER*   Multiple Vitamins-Minerals (MULTIVITAMIN ADULT PO) Take 1 tablet by mouth daily.   pantoprazole  (PROTONIX ) 40 MG tablet Take 1 tablet (40 mg total) by mouth 2 (two) times daily.   Rimegepant Sulfate (NURTEC) 75 MG TBDP Take 1 tablet (75 mg total) by mouth as needed (migraine).   SYNTHROID  100 MCG tablet Take 100 mcg by mouth daily at 6 (six) AM.   Tiotropium Bromide Monohydrate  (  SPIRIVA  RESPIMAT) 1.25 MCG/ACT AERS INHALE 2 PUFFS INTO THE LUNGS ONCE DAILY   Vitamin D , Ergocalciferol , (DRISDOL) 1.25 MG (50000 UT) CAPS capsule Take 50,000 Units by mouth every 7 (seven) days.   [DISCONTINUED] desvenlafaxine  (PRISTIQ ) 50 MG 24 hr tablet Take 1 tablet (50 mg total) by mouth daily. (Patient taking differently: Take 25 mg by mouth daily.)   [DISCONTINUED] aspirin  EC 81 MG tablet Take 1 tablet (81 mg total) by mouth 2 (two) times daily. For 2 weeks then back to once a day for DVT prevention. (Patient not taking: Reported on 02/20/2024)   No facility-administered medications prior to visit.   ROS per HPI     Objective:     BP 111/78   Pulse 80   Temp 97.6 F (36.4 C)   Ht 5' 7 (1.702 m)   Wt 193 lb 6.4 oz (87.7 kg)   SpO2 95%   BMI 30.29 kg/m  BP Readings from Last 3 Encounters:  05/22/24 111/78  02/20/24 105/70  11/26/23 121/81   Wt Readings from Last 3 Encounters:  05/22/24 193 lb 6.4 oz (87.7 kg)  02/20/24 189 lb (85.7 kg)  11/26/23 182 lb 9.6 oz (82.8 kg)   SpO2 Readings from Last 3 Encounters:  05/22/24 95%  02/20/24 98%  11/26/23 97%      Physical Exam Vitals and nursing note reviewed.  Constitutional:      General: She is not in acute distress.    Appearance: Normal appearance. She is well-developed and well-groomed. She is obese. She is not ill-appearing, toxic-appearing or diaphoretic.  HENT:     Head: Normocephalic and atraumatic.     Jaw: There is normal jaw occlusion.     Right Ear: Hearing, tympanic membrane, ear canal and external ear  normal.     Left Ear: Hearing, tympanic membrane, ear canal and external ear normal.     Nose: Nose normal.     Mouth/Throat:     Lips: Pink.     Mouth: Mucous membranes are moist.     Dentition: Abnormal dentition (recent dental extractions in preparation for dentures).     Pharynx: Oropharynx is clear. Uvula midline.   Eyes:     General: Lids are normal.     Extraocular Movements: Extraocular movements intact.     Conjunctiva/sclera: Conjunctivae normal.     Pupils: Pupils are equal, round, and reactive to light.   Neck:     Thyroid : No thyroid  mass, thyromegaly or thyroid  tenderness.     Vascular: No carotid bruit or JVD.     Trachea: Trachea and phonation normal.   Cardiovascular:     Rate and Rhythm: Normal rate and regular rhythm.     Chest Wall: PMI is not displaced.     Pulses: Normal pulses.     Heart sounds: Normal heart sounds. No murmur heard.    No friction rub. No gallop.  Pulmonary:     Effort: Pulmonary effort is normal. No respiratory distress.     Breath sounds: Normal breath sounds. No wheezing.  Abdominal:     General: Bowel sounds are normal. There is no distension or abdominal bruit.     Palpations: Abdomen is soft. There is no hepatomegaly or splenomegaly.     Tenderness: There is no abdominal tenderness. There is no right CVA tenderness or left CVA tenderness.     Hernia: No hernia is present.   Musculoskeletal:        General: Normal range of motion.  Cervical back: Normal range of motion and neck supple.     Right lower leg: No edema.     Left lower leg: No edema.  Lymphadenopathy:     Cervical: No cervical adenopathy.   Skin:    General: Skin is warm and dry.     Capillary Refill: Capillary refill takes less than 2 seconds.     Coloration: Skin is not cyanotic, jaundiced or pale.     Findings: No rash.   Neurological:     General: No focal deficit present.     Mental Status: She is alert and oriented to person, place, and time.      Sensory: Sensation is intact.     Motor: Motor function is intact.     Coordination: Coordination is intact.     Gait: Gait is intact.     Deep Tendon Reflexes: Reflexes are normal and symmetric.   Psychiatric:        Attention and Perception: Attention and perception normal.        Mood and Affect: Mood and affect normal.        Speech: Speech normal.        Behavior: Behavior normal. Behavior is cooperative.        Thought Content: Thought content normal.        Cognition and Memory: Cognition and memory normal.        Judgment: Judgment normal.       Last CBC Lab Results  Component Value Date   WBC 4.4 11/26/2023   HGB 14.4 11/26/2023   HCT 43.5 11/26/2023   MCV 93 11/26/2023   MCH 30.7 11/26/2023   RDW 12.4 11/26/2023   PLT 167 11/26/2023   Last metabolic panel Lab Results  Component Value Date   GLUCOSE 102 (H) 11/26/2023   NA 143 11/26/2023   K 4.4 11/26/2023   CL 104 11/26/2023   CO2 24 11/26/2023   BUN 15 11/26/2023   CREATININE 0.75 11/26/2023   EGFR 95 11/26/2023   CALCIUM 9.5 11/26/2023   PHOS 4.2 08/21/2019   PROT 6.4 11/26/2023   ALBUMIN  4.5 11/26/2023   LABGLOB 1.9 11/26/2023   AGRATIO 2.4 (H) 05/09/2023   BILITOT 0.7 11/26/2023   ALKPHOS 80 11/26/2023   AST 19 11/26/2023   ALT 21 11/26/2023   ANIONGAP 13 06/23/2023   Last lipids Lab Results  Component Value Date   CHOL 202 (H) 11/26/2023   HDL 40 11/26/2023   LDLCALC 143 (H) 11/26/2023   TRIG 105 11/26/2023   CHOLHDL 5.1 (H) 11/26/2023   Last hemoglobin A1c Lab Results  Component Value Date   HGBA1C 5.7 (H) 09/10/2023   Last thyroid  functions Lab Results  Component Value Date   TSH 0.230 (L) 11/26/2023   T4TOTAL 11.4 11/26/2023   Last vitamin D  Lab Results  Component Value Date   VD25OH 52.7 11/26/2023   Last vitamin B12 and Folate Lab Results  Component Value Date   VITAMINB12 386 11/26/2023   FOLATE >20.0 11/26/2023        Assessment & Plan:    Routine Health  Maintenance and Physical Exam  Immunization History  Administered Date(s) Administered   PFIZER(Purple Top)SARS-COV-2 Vaccination 08/01/2020, 09/01/2020   Tdap 02/05/2014, 01/11/2016    Health Maintenance  Topic Date Due   COVID-19 Vaccine (3 - Pfizer risk series) 06/07/2024 (Originally 09/29/2020)   Zoster Vaccines- Shingrix (1 of 2) 08/22/2024 (Originally 03/08/1988)   Hepatitis C Screening  11/25/2024 (Originally 03/09/1987)  HIV Screening  11/25/2024 (Originally 03/08/1984)   Pneumococcal Vaccine 76-57 Years old (1 of 2 - PCV) 05/22/2025 (Originally 03/08/1988)   MAMMOGRAM  05/22/2025 (Originally 12/18/2023)   INFLUENZA VACCINE  07/10/2024   DTaP/Tdap/Td (3 - Td or Tdap) 01/10/2026   Colonoscopy  09/24/2026   HPV VACCINES  Aged Out   Meningococcal B Vaccine  Aged Out    Discussed health benefits of physical activity, and encouraged her to engage in regular exercise appropriate for her age and condition.  Problem List Items Addressed This Visit       Endocrine   Acquired hypothyroidism   Relevant Orders   Thyroid  Panel With TSH   Vitamin B12     Other   Generalized anxiety disorder (Chronic)   Relevant Medications   desvenlafaxine  (PRISTIQ ) 25 MG 24 hr tablet   Other Relevant Orders   CBC with Differential/Platelet   CMP14+EGFR   Thyroid  Panel With TSH   Vitamin B12   VITAMIN D  25 Hydroxy (Vit-D Deficiency, Fractures)   Vitamin D  deficiency   Relevant Orders   CMP14+EGFR   VITAMIN D  25 Hydroxy (Vit-D Deficiency, Fractures)   Recurrent major depressive disorder in partial remission (HCC)   Relevant Medications   desvenlafaxine  (PRISTIQ ) 25 MG 24 hr tablet   Other Relevant Orders   Thyroid  Panel With TSH   Vitamin B12   VITAMIN D  25 Hydroxy (Vit-D Deficiency, Fractures)   Mixed hyperlipidemia   Relevant Medications   furosemide (LASIX) 20 MG tablet   Other Relevant Orders   CMP14+EGFR   Lipid panel   Prediabetes   Relevant Orders   CBC with  Differential/Platelet   CMP14+EGFR   Lipid panel   Thyroid  Panel With TSH   Bayer DCA Hb A1c Waived   Other Visit Diagnoses       Annual physical exam    -  Primary   Relevant Orders   CBC with Differential/Platelet   MM Digital Screening   Hepatitis C antibody   HIV Antibody (routine testing w rflx)     Severe episode of recurrent major depressive disorder, without psychotic features (HCC)       Relevant Medications   desvenlafaxine  (PRISTIQ ) 25 MG 24 hr tablet     Paresthesia of both hands       Relevant Orders   CBC with Differential/Platelet   CMP14+EGFR   Thyroid  Panel With TSH   Vitamin B12     Encounter for screening mammogram for malignant neoplasm of breast       Relevant Orders   MM Digital Screening     Screening examination for infectious disease       Relevant Orders   Hepatitis C antibody   HIV Antibody (routine testing w rflx)         Joint Pain and Numbness Reports significant joint pain and numbness, particularly in her hands, affecting her ability to perform tasks such as opening bottles. Symptoms are consistent with possible vitamin deficiencies, particularly B12, which is known to cause numbness and tingling. Non-compliance with B12 supplementation noted, important given thyroid  disease and potential absorption issues. Vitamin D  deficiency may also contribute to arthralgias and bone pain. - Add B12 and vitamin D  levels to lab tests - Encourage compliance with B12 and vitamin D  supplementation  Gastroesophageal Reflux Disease (GERD) Continues pantoprazole  (Protonix ) for GERD, which is well-controlled. Reports left-sided pain and shoulder pain, suspected by gastroenterologist to be related to gallbladder issues. Postponed further evaluation for gallbladder. - Continue pantoprazole  (Protonix ) -  Encourage follow-up with gastroenterologist for gallbladder evaluation  Depression Taking Pristiq  (desvenlafaxine ) with improvement in symptoms after reducing dose  to 25 mg. Feels less spaced out and more in control after therapy. No current thoughts of self-harm or harm to others. Feels empowered and engages in activities and therapy. Pristiq  should not be cut in half due to dosing concerns. - Prescribe Pristiq  25 mg - Continue therapy and coping mechanisms  General Health Maintenance Due for mammogram, concerns about insurance coverage noted. Had hysterectomy, no Pap smear needed. Considering shingles and pneumonia vaccines. - Order mammogram - Discuss shingles and pneumonia vaccines       Return in about 1 year (around 05/22/2025) for Annual Physical.     Kattie Parrot, FNP

## 2024-05-23 LAB — HEPATITIS C ANTIBODY: Hep C Virus Ab: NONREACTIVE

## 2024-05-23 LAB — CBC WITH DIFFERENTIAL/PLATELET
Basophils Absolute: 0 10*3/uL (ref 0.0–0.2)
Basos: 0 %
EOS (ABSOLUTE): 0.1 10*3/uL (ref 0.0–0.4)
Eos: 2 %
Hematocrit: 45.8 % (ref 34.0–46.6)
Hemoglobin: 15.1 g/dL (ref 11.1–15.9)
Immature Grans (Abs): 0 10*3/uL (ref 0.0–0.1)
Immature Granulocytes: 0 %
Lymphocytes Absolute: 1.4 10*3/uL (ref 0.7–3.1)
Lymphs: 37 %
MCH: 30.9 pg (ref 26.6–33.0)
MCHC: 33 g/dL (ref 31.5–35.7)
MCV: 94 fL (ref 79–97)
Monocytes Absolute: 0.4 10*3/uL (ref 0.1–0.9)
Monocytes: 11 %
Neutrophils Absolute: 1.9 10*3/uL (ref 1.4–7.0)
Neutrophils: 50 %
Platelets: 170 10*3/uL (ref 150–450)
RBC: 4.88 x10E6/uL (ref 3.77–5.28)
RDW: 13.1 % (ref 11.7–15.4)
WBC: 3.7 10*3/uL (ref 3.4–10.8)

## 2024-05-23 LAB — CMP14+EGFR
ALT: 40 IU/L — ABNORMAL HIGH (ref 0–32)
AST: 26 IU/L (ref 0–40)
Albumin: 4.6 g/dL (ref 3.8–4.9)
Alkaline Phosphatase: 94 IU/L (ref 44–121)
BUN/Creatinine Ratio: 23 (ref 9–23)
BUN: 16 mg/dL (ref 6–24)
Bilirubin Total: 0.9 mg/dL (ref 0.0–1.2)
CO2: 25 mmol/L (ref 20–29)
Calcium: 9.8 mg/dL (ref 8.7–10.2)
Chloride: 101 mmol/L (ref 96–106)
Creatinine, Ser: 0.71 mg/dL (ref 0.57–1.00)
Globulin, Total: 2.1 g/dL (ref 1.5–4.5)
Glucose: 126 mg/dL — ABNORMAL HIGH (ref 70–99)
Potassium: 4.2 mmol/L (ref 3.5–5.2)
Sodium: 146 mmol/L — ABNORMAL HIGH (ref 134–144)
Total Protein: 6.7 g/dL (ref 6.0–8.5)
eGFR: 100 mL/min/{1.73_m2} (ref >59)

## 2024-05-23 LAB — HIV ANTIBODY (ROUTINE TESTING W REFLEX): HIV Screen 4th Generation wRfx: NONREACTIVE

## 2024-05-23 LAB — LIPID PANEL
Chol/HDL Ratio: 5 ratio — ABNORMAL HIGH (ref 0.0–4.4)
Cholesterol, Total: 181 mg/dL (ref 100–199)
HDL: 36 mg/dL — ABNORMAL LOW (ref >39)
LDL Chol Calc (NIH): 117 mg/dL — ABNORMAL HIGH (ref 0–99)
Triglycerides: 156 mg/dL — ABNORMAL HIGH (ref 0–149)
VLDL Cholesterol Cal: 28 mg/dL (ref 5–40)

## 2024-05-23 LAB — THYROID PANEL WITH TSH
Free Thyroxine Index: 2.9 (ref 1.2–4.9)
T3 Uptake Ratio: 27 % (ref 24–39)
T4, Total: 10.6 ug/dL (ref 4.5–12.0)
TSH: 0.423 u[IU]/mL — ABNORMAL LOW (ref 0.450–4.500)

## 2024-05-23 LAB — VITAMIN B12: Vitamin B-12: 509 pg/mL (ref 232–1245)

## 2024-05-23 LAB — VITAMIN D 25 HYDROXY (VIT D DEFICIENCY, FRACTURES): Vit D, 25-Hydroxy: 66.6 ng/mL (ref 30.0–100.0)

## 2024-06-17 ENCOUNTER — Ambulatory Visit: Admitting: Gastroenterology

## 2024-06-23 ENCOUNTER — Encounter: Payer: Self-pay | Admitting: Diagnostic Neuroimaging

## 2024-06-23 ENCOUNTER — Telehealth: Payer: Self-pay | Admitting: Diagnostic Neuroimaging

## 2024-06-23 ENCOUNTER — Telehealth: Payer: BLUE CROSS/BLUE SHIELD | Admitting: Diagnostic Neuroimaging

## 2024-06-23 DIAGNOSIS — G43109 Migraine with aura, not intractable, without status migrainosus: Secondary | ICD-10-CM

## 2024-06-23 DIAGNOSIS — Q2730 Arteriovenous malformation, site unspecified: Secondary | ICD-10-CM | POA: Diagnosis not present

## 2024-06-23 MED ORDER — NURTEC 75 MG PO TBDP: 75.0000 mg | ORAL_TABLET | ORAL | 12 refills | Status: AC | PRN

## 2024-06-23 MED ORDER — AJOVY 225 MG/1.5ML ~~LOC~~ SOAJ: 225.0000 mg | SUBCUTANEOUS | 4 refills | Status: AC

## 2024-06-23 NOTE — Progress Notes (Signed)
 [facility]  PATIENT: Olivia Zamora DOB: 12/10/1971  REFERRING CLINICIAN: Severa Rock HERO, FNP HISTORY FROM: patient REASON FOR VISIT: follow up   HISTORICAL  CHIEF COMPLAINT:  Chief Complaint  Patient presents with   Migraine    HISTORY OF PRESENT ILLNESS:   UPDATE (06/23/24, VRP): Since last visit, doing well. HA are greatly improved. Feels great overall. Has not been back to [facility] / vascular due to insurance and out of network status.   UPDATE (06/24/23, VRP): Since last visit, doing well on ajovy  and nurtec. Only mild tension HA before next shot of ajovy . Overall doing well. Also had left knee replacement in 06/04/23. [facility] vascular malformation clinic follow up pending.   PRIOR HPI (12/11/22): 53 year old female here for evaluation of headaches.    In 2017 had episode of facial drooping and unilateral weakness.  She went to the hospital for evaluation.  Stroke workup was obtained.  Stroke was ruled out.  She was diagnosed with possible complicated migraine.  She followed up with neurology in [place]  where she was living at the time.  Has some history of migraine with aura since childhood.  Headaches typically in the back of her head, sometimes on the right side with throbbing pain, nausea, sensitive to light and sound.  She has tried propranolol, topiramate, gabapentin, Imitrex without relief.  Also has been having some issues with left knee pain (since 2022).  She saw orthopedic clinic and was diagnosed with vascular malformation of the knee.  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  She saw [facility] neurosurgery for evaluation.  She was recommended have additional test including cerebral angiogram and MRI of the thoracic and lumbar spine.  However she could not complete this testing due to financial and insurance reasons.   REVIEW OF SYSTEMS: Full 14 system review of systems performed and negative with  exception of: as per HPI.  ALLERGIES: Allergies  Allergen Reactions   Iodine  Anaphylaxis   Iohexol  Anaphylaxis    Pre meds given in past with no reaction noted.     Latex Anaphylaxis and Other (See Comments)    latex   Topamax [Topiramate] Anaphylaxis   Zanaflex  [Tizanidine ] Shortness Of Breath    Swelling, chest pain     HOME MEDICATIONS: Outpatient Medications Prior to Visit  Medication Sig Dispense Refill   Cyanocobalamin 5000 MCG/ML LIQD Place under the tongue.     desvenlafaxine  (PRISTIQ ) 25 MG 24 hr tablet Take 1 tablet (25 mg total) by mouth daily. 90 tablet 1   docusate sodium  (COLACE) 100 MG capsule Take 1 capsule (100 mg total) by mouth daily. (Patient taking differently: Take 100 mg by mouth 2 (two) times daily.) 90 capsule 1   furosemide (LASIX) 20 MG tablet Take 20 mg by mouth daily.     levalbuterol  (XOPENEX  HFA) 45 MCG/ACT inhaler Inhale 2 puffs into the lungs every 4 (four) hours as needed for wheezing or shortness of breath (coughing fits). 3 each 3   levalbuterol  (XOPENEX ) 0.63 MG/3ML nebulizer solution TAKE 3 MLS BY NEBULIZATION EVERY 4 HOURS AS NEEDED FOR WHEEZING, SHORTNESS OF BREATH OR COUGH FITS 90 mL 1   linaclotide  (LINZESS ) 72 MCG capsule Take 1 capsule (72 mcg total) by mouth daily before breakfast. 90 capsule 2   losartan  (COZAAR ) 50 MG tablet TAKE 1 TABLET DAILY        *CAMBER* 90 tablet 0   Multiple Vitamins-Minerals (MULTIVITAMIN ADULT PO) Take 1 tablet  by mouth daily.     pantoprazole  (PROTONIX ) 40 MG tablet Take 1 tablet (40 mg total) by mouth 2 (two) times daily. 90 tablet 3   SYNTHROID  100 MCG tablet Take 100 mcg by mouth daily at 6 (six) AM.     Tiotropium Bromide Monohydrate  (SPIRIVA  RESPIMAT) 1.25 MCG/ACT AERS INHALE 2 PUFFS INTO THE LUNGS ONCE DAILY 4 g 10   Vitamin D , Ergocalciferol , (DRISDOL) 1.25 MG (50000 UT) CAPS capsule Take 50,000 Units by mouth every 7 (seven) days.     Fremanezumab -vfrm (AJOVY ) 225 MG/1.5ML SOAJ INJECT 1.5ML INTO THE SKIN  EVERY 28 DAYS 4.5 mL 1   Rimegepant Sulfate (NURTEC) 75 MG TBDP Take 1 tablet (75 mg total) by mouth as needed (migraine). 8 tablet 12   No facility-administered medications prior to visit.    PAST MEDICAL HISTORY: Past Medical History:  Diagnosis Date   Anorexia    Anxiety    Failed therapy with Paxil, Lexparo, Prozac, Wellbutrin , Xanax, and Ativan   Arthritis    Asthma    Bulimia    Cardiac disease    Angina   Chronic back pain    Constipation 08/26/2019   Depression    Failed therapy with Paxil, Lexparo, Prozac, Wellbutrin , and Ativan   Disease of thyroid  gland 12/28/2011   multiple thyroid  nodules in 2013 - US  on 03/08/14 showed heterogeneous appearance of thyroid  gland without focal nodule   Eczema    Generalized seizure (HCC)    stress related   GERD (gastroesophageal reflux disease)    History of DVT (deep vein thrombosis) 1988   Hypertension    Hyperthyroidism    Hypothyroidism    IBS (irritable bowel syndrome)    Internal derangement of left knee    Lactose intolerance    Migraine    Sleep apnea    wears CPAP   Vitamin D  deficiency     PAST SURGICAL HISTORY: Past Surgical History:  Procedure Laterality Date   CESAREAN SECTION     x3   COLONOSCOPY     HEMANGIOMA EXCISION Right    shoulder   IR RADIOLOGIST EVAL & MGMT  05/04/2021   MASS EXCISION Right    lump from palm of hand   OOPHORECTOMY     OTHER SURGICAL HISTORY Right    Repair of tib-fib fracture   TOTAL ABDOMINAL HYSTERECTOMY     TOTAL KNEE ARTHROPLASTY Left 06/04/2023   Procedure: LEFT TOTAL KNEE ARTHROPLASTY;  Surgeon: Sheril Coy, MD;  Location: [facility];  Service: Orthopedics;  Laterality: Left;   TUBAL LIGATION     TUMOR EXCISION Right    Axilla - benign   VASCULAR SURGERY Right 1989   blood clot removed from neck    FAMILY HISTORY: Family History  Problem Relation Age of Onset   Arthritis Mother    Hypertension Mother    Thyroid  disease Mother    Depression Mother    Migraines  Mother    Deep vein thrombosis Mother        in her leg   Arthritis Father    Hypertension Father    Depression Father    CVA Father    Factor V Leiden deficiency Sister    CVA Sister        30s   Thyroid  disease Sister    Breast cancer Sister    Multiple sclerosis Sister    Other Sister        guillain barre   Depression Sister    Migraines  Sister    CVA Sister    Thyroid  disease Sister    Depression Sister    Migraines Sister    Hypertension Maternal Grandmother    Depression Maternal Grandmother    Anxiety disorder Maternal Grandmother    Hypertension Maternal Grandfather    Hypertension Paternal Grandmother    Hypertension Paternal Grandfather    Asthma Daughter    Depression Daughter    Anxiety disorder Daughter    Irritable bowel syndrome Daughter    Lactose intolerance Daughter    Migraines Daughter    Asthma Son    Depression Son    Anxiety disorder Son    Irritable bowel syndrome Son    Lactose intolerance Son    Migraines Son    Asthma Son    Depression Son    Anxiety disorder Son    Irritable bowel syndrome Son    Lactose intolerance Son    Migraines Son    Asthma Niece    Other Niece        antiphospholipid AB syndrome   Colon cancer Neg Hx    Esophageal cancer Neg Hx    Rectal cancer Neg Hx    Stomach cancer Neg Hx     SOCIAL HISTORY: Social History   Socioeconomic History   Marital status: Married    Spouse name: Not on file   Number of children: 3   Years of education: Not on file   Highest education level: Associate degree: occupational, Scientist, product/process development, or vocational program  Occupational History   Occupation: Retired Charity fundraiser  Tobacco Use   Smoking status: Former    Types: Cigarettes   Smokeless tobacco: Never   Tobacco comments:    Smoked when she was 13 for about a week- 09/03/23  Vaping Use   Vaping status: Never Used  Substance and Sexual Activity   Alcohol use: Never   Drug use: Never   Sexual activity: Not on file  Other Topics  Concern   Not on file  Social History Narrative   Not on file   Social Drivers of Health   Financial Resource Strain: Low Risk  (05/21/2024)   Overall Financial Resource Strain (CARDIA)    Difficulty of Paying Living Expenses: Not hard at all  Food Insecurity: No Food Insecurity (05/21/2024)   Hunger Vital Sign    Worried About Running Out of Food in the Last Year: Never true    Ran Out of Food in the Last Year: Never true  Transportation Needs: No Transportation Needs (05/21/2024)   PRAPARE - Administrator, Civil Service (Medical): No    Lack of Transportation (Non-Medical): No  Physical Activity: Sufficiently Active (05/21/2024)   Exercise Vital Sign    Days of Exercise per Week: 7 days    Minutes of Exercise per Session: 40 min  Stress: No Stress Concern Present (05/21/2024)   [employer] of Occupational Health - Occupational Stress Questionnaire    Feeling of Stress: Not at all  Social Connections: Moderately Integrated (05/21/2024)   Social Connection and Isolation Panel    Frequency of Communication with Friends and Family: More than three times a week    Frequency of Social Gatherings with Friends and Family: Once a week    Attends Religious Services: 1 to 4 times per year    Active Member of [employer] or Organizations: No    Attends Engineer, structural: Not on file    Marital Status: Married  Catering manager Violence: Not on  file     PHYSICAL EXAM  Video visit    DIAGNOSTIC DATA (LABS, IMAGING, TESTING) - I reviewed patient records, labs, notes, testing and imaging myself where available.  Lab Results  Component Value Date   WBC 3.7 05/22/2024   HGB 15.1 05/22/2024   HCT 45.8 05/22/2024   MCV 94 05/22/2024   PLT 170 05/22/2024      Component Value Date/Time   NA 146 (H) 05/22/2024 0924   K 4.2 05/22/2024 0924   CL 101 05/22/2024 0924   CO2 25 05/22/2024 0924   GLUCOSE 126 (H) 05/22/2024 0924   GLUCOSE 107 (H) 06/23/2023 0545    BUN 16 05/22/2024 0924   CREATININE 0.71 05/22/2024 0924   CALCIUM 9.8 05/22/2024 0924   PROT 6.7 05/22/2024 0924   ALBUMIN  4.6 05/22/2024 0924   AST 26 05/22/2024 0924   ALT 40 (H) 05/22/2024 0924   ALKPHOS 94 05/22/2024 0924   BILITOT 0.9 05/22/2024 0924   GFRNONAA >60 06/23/2023 0545   GFRAA 123 08/21/2019 1428   Lab Results  Component Value Date   CHOL 181 05/22/2024   HDL 36 (L) 05/22/2024   LDLCALC 117 (H) 05/22/2024   TRIG 156 (H) 05/22/2024   CHOLHDL 5.0 (H) 05/22/2024   Lab Results  Component Value Date   HGBA1C 5.3 05/22/2024   Lab Results  Component Value Date   VITAMINB12 509 05/22/2024   Lab Results  Component Value Date   TSH 0.423 (L) 05/22/2024    04/06/22 MRI brain [I reviewed images myself and agree with interpretation. -VRP]  1. No acute intracranial abnormality. Normal for age non contrast MRI appearance of the brain. No evidence of demyelinating disease. 2. 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   ASSESSMENT AND PLAN  53 y.o. year old female here with:  Dx:  1. Migraine with aura and without status migrainosus, not intractable   2. 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    Dispense:  4.5 mL    Refill:  4   Rimegepant Sulfate (NURTEC) 75 MG TBDP    Sig: Take 1 tablet (75 mg total) by mouth as needed (migraine).    Dispense:  8 tablet    Refill:  12   Orders Placed This Encounter  Procedures   Ambulatory referral to Neurosurgery   Return in about 8 months (around 02/21/2025) for MyChart visit (15 min).  Virtual Visit via Video Note  I connected with Olivia Zamora on 06/23/2024 at  2:45 PM EDT by a video enabled telemedicine application and verified that I am speaking with the correct person using two identifiers.   I discussed the  limitations of evaluation and management by telemedicine and the availability of in person appointments. The patient expressed understanding and agreed to proceed.  Patient is at home and I am at the office.   I spent 25 minutes of face-to-face and non-face-to-face time with patient.  This included previsit chart review, lab review, study review, order entry, electronic health record documentation, patient education.       EDUARD FABIENE HANLON, MD 06/23/2024, 3:07 PM Certified in Neurology, Neurophysiology and Neuroimaging  [facility] [address], [address] [place], [place] [address] [phone]

## 2024-06-23 NOTE — Telephone Encounter (Signed)
 Referral for neurosurgery sent through [facility] to [facility] at [facility] to see Dr. Dino HERO. Janj[place]. Phone: [phone], Fax: [phone]

## 2024-06-25 ENCOUNTER — Emergency Department (HOSPITAL_COMMUNITY)
Admission: EM | Admit: 2024-06-25 | Discharge: 2024-06-26 | Disposition: A | Discharge status: Home / Self Care | Attending: Emergency Medicine | Admitting: Emergency Medicine

## 2024-06-25 ENCOUNTER — Other Ambulatory Visit: Payer: Self-pay

## 2024-06-25 ENCOUNTER — Encounter (HOSPITAL_COMMUNITY): Payer: Self-pay | Admitting: Emergency Medicine

## 2024-06-25 ENCOUNTER — Emergency Department (HOSPITAL_COMMUNITY)

## 2024-06-25 DIAGNOSIS — I10 Essential (primary) hypertension: Secondary | ICD-10-CM | POA: Insufficient documentation

## 2024-06-25 DIAGNOSIS — Z91040 Latex allergy status: Secondary | ICD-10-CM | POA: Diagnosis not present

## 2024-06-25 DIAGNOSIS — J45909 Unspecified asthma, uncomplicated: Secondary | ICD-10-CM | POA: Diagnosis not present

## 2024-06-25 DIAGNOSIS — S6991XA Unspecified injury of right wrist, hand and finger(s), initial encounter: Secondary | ICD-10-CM | POA: Diagnosis present

## 2024-06-25 DIAGNOSIS — S52501A Unspecified fracture of the lower end of right radius, initial encounter for closed fracture: Secondary | ICD-10-CM | POA: Insufficient documentation

## 2024-06-25 DIAGNOSIS — Z79899 Other long term (current) drug therapy: Secondary | ICD-10-CM | POA: Insufficient documentation

## 2024-06-25 DIAGNOSIS — W19XXXA Unspecified fall, initial encounter: Secondary | ICD-10-CM | POA: Insufficient documentation

## 2024-06-25 MED ORDER — ACETAMINOPHEN 325 MG PO TABS
650.0000 mg | ORAL_TABLET | Freq: Once | ORAL | Status: AC
  Administered 2024-06-25: 650 mg via ORAL
  Filled 2024-06-25: qty 2

## 2024-06-25 NOTE — ED Notes (Signed)
 Ortho contacted about splint

## 2024-06-25 NOTE — Discharge Instructions (Addendum)
 X-ray shows possible fracture.  You have been placed in a splint today.  Please follow-up with hand surgeon as discussed.  Alternate Tylenol  and ibuprofen for pain control.  Return to ER with worsening symptoms.

## 2024-06-25 NOTE — ED Triage Notes (Signed)
 Pt slipped and fell onto concrete onto right wrist/forearm and it feels locked in place.  Swelling noted.  PMS intact.  Good cap refill.

## 2024-06-25 NOTE — ED Provider Notes (Signed)
 [place] EMERGENCY DEPARTMENT AT [facility] Provider Note   CSN: [id number] Arrival date & time: 06/25/24  2017     Patient presents with: Wrist Injury   Olivia Zamora is a 53 y.o. female.  Patient with past medical history of seizures, hypertension, asthma presents to emergency room with complaint of right wrist pain.  Patient reports she had mechanical fall falling backwards onto her bottom and fell onto an outstretched arm.  Notes right wrist pain.  Patient feels very hesitant to move her wrist but does not know any severe pain.  She has some swelling but no significant numbness and tingling.    Wrist Injury      Prior to Admission medications   Medication Sig Start Date End Date Taking? Authorizing Provider  Cyanocobalamin 5000 MCG/ML LIQD Place under the tongue.    [provider]  desvenlafaxine  (PRISTIQ ) 25 MG 24 hr tablet Take 1 tablet (25 mg total) by mouth daily. 05/22/24   Severa Rock HERO, FNP  docusate sodium  (COLACE) 100 MG capsule Take 1 capsule (100 mg total) by mouth daily. Patient taking differently: Take 100 mg by mouth 2 (two) times daily. 06/20/22   Merlynn Niki FALCON, FNP  Fremanezumab -vfrm (AJOVY ) 225 MG/1.5ML SOAJ Inject 225 mg into the skin every 30 (thirty) days. 06/23/24   Penumalli, Vikram R, MD  furosemide (LASIX) 20 MG tablet Take 20 mg by mouth daily. 03/12/24   [provider]  levalbuterol  (XOPENEX  HFA) 45 MCG/ACT inhaler Inhale 2 puffs into the lungs every 4 (four) hours as needed for wheezing or shortness of breath (coughing fits). 11/04/23   Zollie Lowers, MD  levalbuterol  (XOPENEX ) 0.63 MG/3ML nebulizer solution TAKE 3 MLS BY NEBULIZATION EVERY 4 HOURS AS NEEDED FOR WHEEZING, SHORTNESS OF BREATH OR COUGH FITS 11/11/23   Luke Orlan HERO, DO  linaclotide  (LINZESS ) 72 MCG capsule Take 1 capsule (72 mcg total) by mouth daily before breakfast. 05/10/23   Severa Rock HERO, FNP  losartan  (COZAAR ) 50 MG tablet TAKE 1 TABLET DAILY         *CAMBER* 05/06/24   Severa Rock HERO, FNP  Multiple Vitamins-Minerals (MULTIVITAMIN ADULT PO) Take 1 tablet by mouth daily.    [provider]  pantoprazole  (PROTONIX ) 40 MG tablet Take 1 tablet (40 mg total) by mouth 2 (two) times daily. 09/25/23   Federico Rosario BROCKS, MD  Rimegepant Sulfate (NURTEC) 75 MG TBDP Take 1 tablet (75 mg total) by mouth as needed (migraine). 06/23/24   Penumalli, Eduard SAUNDERS, MD  SYNTHROID  100 MCG tablet Take 100 mcg by mouth daily at 6 (six) AM. 12/11/20   [provider]  Tiotropium Bromide Monohydrate  (SPIRIVA  RESPIMAT) 1.25 MCG/ACT AERS INHALE 2 PUFFS INTO THE LUNGS ONCE DAILY 11/22/23   Rakes, Rock HERO, FNP  Vitamin D , Ergocalciferol , (DRISDOL) 1.25 MG (50000 UT) CAPS capsule Take 50,000 Units by mouth every 7 (seven) days.    [provider]    Allergies: Iodine , Iohexol , Latex, Topamax [topiramate], and Zanaflex  [tizanidine ]    Review of Systems  Musculoskeletal:  Positive for arthralgias.    Updated Vital Signs BP (!) 129/98 (BP Location: Left Arm)   Pulse 78   Temp 97.8 F (36.6 C) (Oral)   Resp 18   Ht 5' 7 (1.702 m)   Wt 90.3 kg   SpO2 100%   BMI 31.18 kg/m   Physical Exam Vitals and nursing note reviewed.  Constitutional:      General: She is not in acute distress.  Appearance: She is not toxic-appearing.  HENT:     Head: Normocephalic and atraumatic.  Eyes:     General: No scleral icterus.    Conjunctiva/sclera: Conjunctivae normal.  Cardiovascular:     Rate and Rhythm: Normal rate and regular rhythm.     Pulses: Normal pulses.     Heart sounds: Normal heart sounds.  Pulmonary:     Effort: Pulmonary effort is normal. No respiratory distress.     Breath sounds: Normal breath sounds.  Abdominal:     General: Abdomen is flat. Bowel sounds are normal.     Palpations: Abdomen is soft.     Tenderness: There is no abdominal tenderness.  Musculoskeletal:     Comments: Mild tenderness to palpation over dorsal aspect of  carpal bones.  No focal tenderness to palpation, neurovascularly intact.  Skin:    General: Skin is warm and dry.     Findings: No lesion.  Neurological:     General: No focal deficit present.     Mental Status: She is alert and oriented to person, place, and time. Mental status is at baseline.     (all labs ordered are listed, but only abnormal results are displayed) Labs Reviewed - No data to display  EKG: None  Radiology: DG Wrist Complete Right Result Date: 06/25/2024 EXAM: XR WRIST 3 OR MORE VIEWS), (XR FOREARM 2 VIEWS) 06/25/2024 08:57:00 PM COMPARISON: None available. CLINICAL HISTORY: Fall. Pt fell and landed on her wrist. Pt states wrist is stuck in flexion. Pt arm cold and numb from wrists to tips of fingers. FINDINGS: BONES AND JOINTS: Question subtle nondisplaced fractures of the dorsal radius. CT recommended for further evaluation. No joint dislocation. SOFT TISSUES: Adjacent soft tissue swelling. IMPRESSION: 1. Question subtle nondisplaced fractures of the dorsal radius with adjacent soft tissue swelling. CT recommended for further evaluation. Electronically signed by: Norman Gatlin MD 06/25/2024 09:05 PM EDT RP Workstation: [id number]   DG Forearm Right Result Date: 06/25/2024 EXAM: XR WRIST 3 OR MORE VIEWS), (XR FOREARM 2 VIEWS) 06/25/2024 08:57:00 PM COMPARISON: None available. CLINICAL HISTORY: Fall. Pt fell and landed on her wrist. Pt states wrist is stuck in flexion. Pt arm cold and numb from wrists to tips of fingers. FINDINGS: BONES AND JOINTS: Question subtle nondisplaced fractures of the dorsal radius. CT recommended for further evaluation. No joint dislocation. SOFT TISSUES: Adjacent soft tissue swelling. IMPRESSION: 1. Question subtle nondisplaced fractures of the dorsal radius with adjacent soft tissue swelling. CT recommended for further evaluation. Electronically signed by: Norman Gatlin MD 06/25/2024 09:05 PM EDT RP Workstation: [id number]     Procedures    Medications Ordered in the ED  acetaminophen  (TYLENOL ) tablet 650 mg (has no administration in time range)                                    Medical Decision Making Amount and/or Complexity of Data Reviewed Radiology: ordered.  Risk OTC drugs.   This patient presents to the ED for concern of wrist pain, this involves an extensive number of treatment options, and is a complaint that carries with it a high risk of complications and morbidity.  The differential diagnosis includes fracture, DVT, septic joint   Imaging Studies ordered:  I ordered imaging studies including right wrist and forearm   I independently visualized and interpreted imaging which showed possible distal radial fracture. I agree with the radiologist interpretation CT of the  wrist was ordered however due to duration of wait for CT scan discussed possibility of putting patient in splint and following up with a hand without obtaining CT. Patient expressed understanding would like to proceed with getting splinted here and then follow-up outpatient for CT scan versus repeat imaging.   Problem List / ED Course / Critical interventions / Medication management  Patient presents emergency room after mechanical fall.  When she fell she denied her head or lose consciousness.  She is not on blood thinner.  She primarily complains of right wrist pain.  Otherwise hemodynamically stable and well-appearing.  She is neurovascularly intact.  X-ray shows no acute fracture.  Will place in a splint.   Feel stable for discharge with outpatient follow-up.  Given wrist brace here.  I ordered medication including Tylenol  for pain control Reevaluation of the patient after these medicines showed that the patient improved I have reviewed the patients home medicines and have made adjustments as needed   Plan F/u w/ PCP in 2-3d to ensure resolution of sx.  Patient was given return precautions. Patient stable for discharge at this time.   Patient educated on sx/dx and verbalized understanding of plan. Return to ER w/ new or worsening sx.       Final diagnoses:  Closed fracture of distal end of right radius, unspecified fracture morphology, initial encounter    ED Discharge Orders     None          Shermon Warren LOISE DEVONNA 06/25/24 [id number]    Laurice Maude BROCKS, MD 06/25/24 2332

## 2024-06-26 NOTE — Progress Notes (Signed)
 Orthopedic Tech Progress Note Patient Details:  Olivia Zamora 04/25/1971 [id number]  Ortho Devices Type of Ortho Device: Ace wrap, Long arm splint, Cotton web roll Ortho Device/Splint Location: RUE Ortho Device/Splint Interventions: Ordered, Application, Adjustment   Post Interventions Patient Tolerated: Well Instructions Provided: Care of device Order was placed for a short arm splint but no specific type. I called the nurse to ask the provider which type would they like and it was requested to do perform a  sugar tong despite that being a long arm. Applied with no issues.   Avangeline Stockburger L Taijuan Serviss 06/26/2024, 12:59 AM

## 2024-06-30 ENCOUNTER — Ambulatory Visit: Admitting: Gastroenterology

## 2024-07-05 ENCOUNTER — Encounter: Payer: Self-pay | Admitting: Family Medicine

## 2024-07-06 ENCOUNTER — Encounter: Payer: Self-pay | Admitting: Family Medicine

## 2024-07-06 ENCOUNTER — Other Ambulatory Visit: Payer: Self-pay

## 2024-07-06 ENCOUNTER — Encounter: Payer: Self-pay | Admitting: Diagnostic Neuroimaging

## 2024-07-06 DIAGNOSIS — I10 Essential (primary) hypertension: Secondary | ICD-10-CM

## 2024-07-06 DIAGNOSIS — Z0000 Encounter for general adult medical examination without abnormal findings: Secondary | ICD-10-CM

## 2024-07-06 MED ORDER — LOSARTAN POTASSIUM 50 MG PO TABS: 50.0000 mg | ORAL_TABLET | Freq: Every day | ORAL | 1 refills | Status: AC

## 2024-07-10 ENCOUNTER — Other Ambulatory Visit

## 2024-07-20 ENCOUNTER — Telehealth: Payer: Self-pay | Admitting: Family Medicine

## 2024-07-20 NOTE — Telephone Encounter (Signed)
 Attempted to call patient about her balance of $40 from her appt on 05/22/24. The balance is for her copay on this day.   This appt was scheduled for a physical but the provider coded an office visit as well due to other issues that were discussed during the visit.

## 2024-07-27 ENCOUNTER — Telehealth: Payer: Self-pay | Admitting: Family Medicine

## 2024-07-27 NOTE — Telephone Encounter (Signed)
 No labs due at this time. Last labs two months ago.

## 2024-07-27 NOTE — Telephone Encounter (Signed)
 Pt needs orders for lab. Pt has appt 08/19

## 2024-07-28 ENCOUNTER — Other Ambulatory Visit

## 2024-07-28 ENCOUNTER — Other Ambulatory Visit: Payer: Self-pay | Admitting: Family Medicine

## 2024-07-28 DIAGNOSIS — Z0000 Encounter for general adult medical examination without abnormal findings: Secondary | ICD-10-CM

## 2024-07-28 DIAGNOSIS — I10 Essential (primary) hypertension: Secondary | ICD-10-CM

## 2024-07-30 ENCOUNTER — Ambulatory Visit: Admitting: Family Medicine

## 2024-08-04 ENCOUNTER — Encounter: Payer: Self-pay | Admitting: Family Medicine

## 2024-08-05 DIAGNOSIS — F329 Major depressive disorder, single episode, unspecified: Secondary | ICD-10-CM | POA: Insufficient documentation

## 2024-08-05 DIAGNOSIS — L309 Dermatitis, unspecified: Secondary | ICD-10-CM | POA: Insufficient documentation

## 2024-08-05 DIAGNOSIS — K219 Gastro-esophageal reflux disease without esophagitis: Secondary | ICD-10-CM | POA: Insufficient documentation

## 2024-08-05 DIAGNOSIS — I209 Angina pectoris, unspecified: Secondary | ICD-10-CM | POA: Insufficient documentation

## 2024-08-05 DIAGNOSIS — M150 Primary generalized (osteo)arthritis: Secondary | ICD-10-CM | POA: Insufficient documentation

## 2024-08-05 DIAGNOSIS — E042 Nontoxic multinodular goiter: Secondary | ICD-10-CM | POA: Insufficient documentation

## 2024-08-05 DIAGNOSIS — G43709 Chronic migraine without aura, not intractable, without status migrainosus: Secondary | ICD-10-CM | POA: Insufficient documentation

## 2024-08-05 DIAGNOSIS — E7800 Pure hypercholesterolemia, unspecified: Secondary | ICD-10-CM | POA: Insufficient documentation

## 2024-08-05 DIAGNOSIS — M179 Osteoarthritis of knee, unspecified: Secondary | ICD-10-CM | POA: Insufficient documentation

## 2024-08-06 ENCOUNTER — Encounter: Payer: Self-pay | Admitting: Family Medicine

## 2024-08-06 ENCOUNTER — Telehealth: Admitting: Family Medicine

## 2024-08-06 VITALS — Ht 67.0 in | Wt 197.0 lb

## 2024-08-06 DIAGNOSIS — R7303 Prediabetes: Secondary | ICD-10-CM | POA: Diagnosis not present

## 2024-08-06 DIAGNOSIS — I10 Essential (primary) hypertension: Secondary | ICD-10-CM | POA: Diagnosis not present

## 2024-08-06 DIAGNOSIS — E669 Obesity, unspecified: Secondary | ICD-10-CM

## 2024-08-06 DIAGNOSIS — Z8673 Personal history of transient ischemic attack (TIA), and cerebral infarction without residual deficits: Secondary | ICD-10-CM | POA: Diagnosis not present

## 2024-08-06 DIAGNOSIS — E782 Mixed hyperlipidemia: Secondary | ICD-10-CM

## 2024-08-06 DIAGNOSIS — Z6830 Body mass index (BMI) 30.0-30.9, adult: Secondary | ICD-10-CM

## 2024-08-06 MED ORDER — SEMAGLUTIDE-WEIGHT MANAGEMENT 2.4 MG/0.75ML ~~LOC~~ SOAJ: 2.4000 mg | SUBCUTANEOUS | 0 refills | Status: DC

## 2024-08-06 MED ORDER — SEMAGLUTIDE-WEIGHT MANAGEMENT 1.7 MG/0.75ML ~~LOC~~ SOAJ: 1.7000 mg | SUBCUTANEOUS | 0 refills | Status: AC

## 2024-08-06 MED ORDER — SEMAGLUTIDE-WEIGHT MANAGEMENT 0.5 MG/0.5ML ~~LOC~~ SOAJ: 0.5000 mg | SUBCUTANEOUS | 0 refills | Status: AC

## 2024-08-06 MED ORDER — SEMAGLUTIDE-WEIGHT MANAGEMENT 1 MG/0.5ML ~~LOC~~ SOAJ: 1.0000 mg | SUBCUTANEOUS | 0 refills | Status: AC

## 2024-08-06 MED ORDER — SEMAGLUTIDE-WEIGHT MANAGEMENT 0.25 MG/0.5ML ~~LOC~~ SOAJ: 0.2500 mg | SUBCUTANEOUS | 0 refills | Status: AC

## 2024-08-06 NOTE — Progress Notes (Signed)
 Virtual Visit via Video   I connected with patient on 08/06/24 at 1520 by a video enabled telemedicine application and verified that I am speaking with the correct person using two identifiers.  Location patient: Home Location provider: [facility] Persons participating in the virtual visit: Patient and Provider  I discussed the limitations of evaluation and management by telemedicine and the availability of in person appointments. The patient expressed understanding and agreed to proceed.  Subjective:   HPI:  Pt presents today for  Chief Complaint  Patient presents with   Weight Management Screening   Olivia Zamora is a 53 year old female who presents with concerns about weight management.  She has been struggling with weight management for the past fifteen years, initially related to hyperthyroidism. Despite various efforts, her weight has remained stable. She has tried multiple weight loss programs including [employer], [employer], and [employer], as well as apps like Fitness Pal and [employer], without significant weight loss. She reports developing anorexia during this period, feeling pressured by not achieving expected results.  Her physical activity regimen is extensive, including six hours of cardio, two HIIT classes, and various diets such as the carnivore, Mediterranean, and [employer]. She has hired a Insurance account manager and attempted gluten-free and salt-free diets. Despite these efforts, she reports weight gain and physical injuries, including a broken back and knee issues from overexertion.  She experiences fluid retention in her legs, unexplained by normal BMP results. Mobility issues necessitate the use of a walker or cane, and she is scheduled to see a neurosurgeon. Dental issues, including having no teeth and awaiting dentures, impact her ability to eat certain foods.  Her current exercise regimen includes two hours on a  recumbent bike, strength training at the gym, and various home exercises. Knee and back pain limit her ability to perform certain exercises. She uses a digital body composition scale to monitor her weight, which has not changed despite her efforts.  Her current weight is 197 pounds. She monitors her fluid output due to concerns about swelling. Her diet includes protein shakes, yogurt, cottage cheese, eggs, and tuna. She drinks a significant amount of water  but is concerned about swelling in her legs.  She has consulted with an endocrinologist and a nutritionist, neither of whom have provided solutions leading to weight loss. She suspects an underlying insulin-related issue and is frustrated with the lack of progress and its impact on her quality of life.       ROS per HPI  Patient Active Problem List   Diagnosis Date Noted   BMI 30.0-30.9,adult 08/06/2024   Angina pectoris, unspecified (HCC) 08/05/2024   Dermatitis, unspecified 08/05/2024   Gastro-esophageal reflux disease without esophagitis 08/05/2024   Major depression, single episode 08/05/2024   Non-refractory chronic migraine without aura 08/05/2024   Non-toxic multinodular goiter 08/05/2024   Osteoarthritis of knee 08/05/2024   Primary generalized (osteo)arthritis 08/05/2024   Pure hypercholesterolemia 08/05/2024   Prediabetes 11/26/2023   Eating disorder in remission 11/26/2023   History of anorexia nervosa 11/12/2023   History of bulimia nervosa with history of laxative use, diuretics, and purging 11/12/2023   Panic disorder with agoraphobia 11/12/2023   PTSD (post-traumatic stress disorder) 11/12/2023   Mixed hyperlipidemia 05/09/2023   Essential hypertension 11/21/2022   Restless legs 11/21/2022   Controlled substance agreement signed 11/21/2022   Acquired hypothyroidism 11/21/2022   AVM (arteriovenous malformation) 03/01/2022   H/O: CVA (cerebrovascular accident) 08/01/2021   History of  DVT (deep vein thrombosis)  08/01/2021   Symptomatic mammary hypertrophy 06/13/2021   IBS (irritable bowel syndrome) 08/26/2019   Vitamin D  deficiency    Sleep apnea    Recurrent major depressive disorder in partial remission (HCC)    Generalized anxiety disorder    Asthma    Disease of thyroid  gland 12/28/2011    Social History   Tobacco Use   Smoking status: Former    Types: Cigarettes    Passive exposure: Never   Smokeless tobacco: Never   Tobacco comments:    Smoked when she was 13 for about a week- 09/03/23  Substance Use Topics   Alcohol use: Never    Current Outpatient Medications:    semaglutide -weight management (WEGOVY ) 0.25 MG/0.5ML SOAJ SQ injection, Inject 0.25 mg into the skin once a week for 4 doses., Disp: 2 mL, Rfl: 0   [START ON 09/04/2024] semaglutide -weight management (WEGOVY ) 0.5 MG/0.5ML SOAJ SQ injection, Inject 0.5 mg into the skin once a week for 4 doses., Disp: 2 mL, Rfl: 0   [START ON 10/03/2024] semaglutide -weight management (WEGOVY ) 1 MG/0.5ML SOAJ SQ injection, Inject 1 mg into the skin once a week for 4 doses., Disp: 2 mL, Rfl: 0   [START ON 11/01/2024] semaglutide -weight management (WEGOVY ) 1.7 MG/0.75ML SOAJ SQ injection, Inject 1.7 mg into the skin once a week for 4 doses., Disp: 3 mL, Rfl: 0   [START ON 11/30/2024] semaglutide -weight management (WEGOVY ) 2.4 MG/0.75ML SOAJ SQ injection, Inject 2.4 mg into the skin once a week for 4 doses., Disp: 3 mL, Rfl: 0   acetaminophen  (TYLENOL ) 500 MG tablet, Take 500 mg by mouth every 8 (eight) hours as needed for moderate pain (pain score 4-6)., Disp: , Rfl:    aspirin  EC 81 MG tablet, Take 81 mg by mouth daily., Disp: , Rfl:    Cyanocobalamin 5000 MCG/ML LIQD, Place under the tongue., Disp: , Rfl:    docusate sodium  (COLACE) 100 MG capsule, Take 1 capsule (100 mg total) by mouth daily. (Patient taking differently: Take 100 mg by mouth 2 (two) times daily.), Disp: 90 capsule, Rfl: 1   Fremanezumab -vfrm (AJOVY ) 225 MG/1.5ML SOAJ, Inject  225 mg into the skin every 30 (thirty) days., Disp: 4.5 mL, Rfl: 4   furosemide (LASIX) 20 MG tablet, Take 20 mg by mouth daily., Disp: , Rfl:    levalbuterol  (XOPENEX  HFA) 45 MCG/ACT inhaler, Inhale 2 puffs into the lungs every 4 (four) hours as needed for wheezing or shortness of breath (coughing fits)., Disp: 3 each, Rfl: 3   levalbuterol  (XOPENEX ) 0.63 MG/3ML nebulizer solution, TAKE 3 MLS BY NEBULIZATION EVERY 4 HOURS AS NEEDED FOR WHEEZING, SHORTNESS OF BREATH OR COUGH FITS, Disp: 90 mL, Rfl: 1   linaclotide  (LINZESS ) 72 MCG capsule, Take 1 capsule (72 mcg total) by mouth daily before breakfast., Disp: 90 capsule, Rfl: 2   losartan  (COZAAR ) 50 MG tablet, Take 1 tablet (50 mg total) by mouth daily., Disp: 90 tablet, Rfl: 1   Multiple Vitamins-Minerals (MULTIVITAMIN ADULT PO), Take 1 tablet by mouth daily., Disp: , Rfl:    pantoprazole  (PROTONIX ) 40 MG tablet, Take 1 tablet (40 mg total) by mouth 2 (two) times daily., Disp: 90 tablet, Rfl: 3   Rimegepant Sulfate (NURTEC) 75 MG TBDP, Take 1 tablet (75 mg total) by mouth as needed (migraine)., Disp: 8 tablet, Rfl: 12   SYNTHROID  100 MCG tablet, Take 100 mcg by mouth daily at 6 (six) AM., Disp: , Rfl:    Tiotropium Bromide Monohydrate  (SPIRIVA   RESPIMAT) 1.25 MCG/ACT AERS, INHALE 2 PUFFS INTO THE LUNGS ONCE DAILY, Disp: 4 g, Rfl: 10   Vitamin D , Ergocalciferol , (DRISDOL) 1.25 MG (50000 UT) CAPS capsule, Take 50,000 Units by mouth every 7 (seven) days., Disp: , Rfl:   Allergies  Allergen Reactions   Iodine  Anaphylaxis   Iohexol  Anaphylaxis    Pre meds given in past with no reaction noted.     Latex Anaphylaxis and Other (See Comments)    latex   Topamax [Topiramate] Anaphylaxis   Zanaflex  [Tizanidine ] Shortness Of Breath    Swelling, chest pain     Objective:   Ht 5' 7 (1.702 m)   Wt 197 lb (89.4 kg)   BMI 30.85 kg/m   Patient is well-developed, well-nourished in no acute distress.  Resting comfortably at home.  Head is  normocephalic, atraumatic.  No labored breathing.  Speech is clear and coherent with logical content.  Patient is alert and oriented at baseline.    Assessment and Plan:   Olivia Zamora was seen today for weight management screening.  Diagnoses and all orders for this visit:  BMI 30.0-30.9,adult -     semaglutide -weight management (WEGOVY ) 0.25 MG/0.5ML SOAJ SQ injection; Inject 0.25 mg into the skin once a week for 4 doses. -     semaglutide -weight management (WEGOVY ) 0.5 MG/0.5ML SOAJ SQ injection; Inject 0.5 mg into the skin once a week for 4 doses. -     semaglutide -weight management (WEGOVY ) 1 MG/0.5ML SOAJ SQ injection; Inject 1 mg into the skin once a week for 4 doses. -     semaglutide -weight management (WEGOVY ) 1.7 MG/0.75ML SOAJ SQ injection; Inject 1.7 mg into the skin once a week for 4 doses. -     semaglutide -weight management (WEGOVY ) 2.4 MG/0.75ML SOAJ SQ injection; Inject 2.4 mg into the skin once a week for 4 doses.  Essential hypertension -     semaglutide -weight management (WEGOVY ) 0.25 MG/0.5ML SOAJ SQ injection; Inject 0.25 mg into the skin once a week for 4 doses. -     semaglutide -weight management (WEGOVY ) 0.5 MG/0.5ML SOAJ SQ injection; Inject 0.5 mg into the skin once a week for 4 doses. -     semaglutide -weight management (WEGOVY ) 1 MG/0.5ML SOAJ SQ injection; Inject 1 mg into the skin once a week for 4 doses. -     semaglutide -weight management (WEGOVY ) 1.7 MG/0.75ML SOAJ SQ injection; Inject 1.7 mg into the skin once a week for 4 doses. -     semaglutide -weight management (WEGOVY ) 2.4 MG/0.75ML SOAJ SQ injection; Inject 2.4 mg into the skin once a week for 4 doses.  Mixed hyperlipidemia -     semaglutide -weight management (WEGOVY ) 0.25 MG/0.5ML SOAJ SQ injection; Inject 0.25 mg into the skin once a week for 4 doses. -     semaglutide -weight management (WEGOVY ) 0.5 MG/0.5ML SOAJ SQ injection; Inject 0.5 mg into the skin once a week for 4 doses. -      semaglutide -weight management (WEGOVY ) 1 MG/0.5ML SOAJ SQ injection; Inject 1 mg into the skin once a week for 4 doses. -     semaglutide -weight management (WEGOVY ) 1.7 MG/0.75ML SOAJ SQ injection; Inject 1.7 mg into the skin once a week for 4 doses. -     semaglutide -weight management (WEGOVY ) 2.4 MG/0.75ML SOAJ SQ injection; Inject 2.4 mg into the skin once a week for 4 doses.  H/O: CVA (cerebrovascular accident) -     semaglutide -weight management (WEGOVY ) 0.25 MG/0.5ML SOAJ SQ injection; Inject 0.25 mg into the skin once a  week for 4 doses. -     semaglutide -weight management (WEGOVY ) 0.5 MG/0.5ML SOAJ SQ injection; Inject 0.5 mg into the skin once a week for 4 doses. -     semaglutide -weight management (WEGOVY ) 1 MG/0.5ML SOAJ SQ injection; Inject 1 mg into the skin once a week for 4 doses. -     semaglutide -weight management (WEGOVY ) 1.7 MG/0.75ML SOAJ SQ injection; Inject 1.7 mg into the skin once a week for 4 doses. -     semaglutide -weight management (WEGOVY ) 2.4 MG/0.75ML SOAJ SQ injection; Inject 2.4 mg into the skin once a week for 4 doses.  Prediabetes -     semaglutide -weight management (WEGOVY ) 0.25 MG/0.5ML SOAJ SQ injection; Inject 0.25 mg into the skin once a week for 4 doses. -     semaglutide -weight management (WEGOVY ) 0.5 MG/0.5ML SOAJ SQ injection; Inject 0.5 mg into the skin once a week for 4 doses. -     semaglutide -weight management (WEGOVY ) 1 MG/0.5ML SOAJ SQ injection; Inject 1 mg into the skin once a week for 4 doses. -     semaglutide -weight management (WEGOVY ) 1.7 MG/0.75ML SOAJ SQ injection; Inject 1.7 mg into the skin once a week for 4 doses. -     semaglutide -weight management (WEGOVY ) 2.4 MG/0.75ML SOAJ SQ injection; Inject 2.4 mg into the skin once a week for 4 doses.      Obesity Chronic obesity with unsuccessful weight loss attempts through various diets and exercise regimens, including [employer], Weight [employer]chers, and the Mediterranean diet. Extensive  physical activity has led to musculoskeletal injuries. She has experienced frustration and developed anorexia in the past due to weight loss pressure. Current weight is 197 lbs. Insurance has approved Wegovy  for weight management. - Send prescription for Wegovy  to pharmacy. - Advise her to contact insurance to confirm prior authorization for Wegovy . - Schedule an 8-week follow-up appointment after starting Wegovy  to assess weight and perform lab tests. - Provide tips for success and information on Wegovy  side effects via MyChart. - Advise small, frequent meals with high protein content and avoidance of heavy, greasy, fatty foods. - Instruct her to increase water  intake significantly.        Return in about 8 weeks (around 10/01/2024), or if symptoms worsen or fail to improve, for BMI.  Olivia Bruns, FNP-C [facility] [address] [place], [place] [address] [phone]  08/06/2024  Time spent with the patient: 15 minutes, of which >50% was spent in obtaining information about symptoms, reviewing previous labs, evaluations, and treatments, counseling about condition (please see the discussed topics above), and developing a plan to further investigate it; had a number of questions which I addressed.

## 2024-08-06 NOTE — Patient Instructions (Signed)

## 2024-08-07 ENCOUNTER — Ambulatory Visit (INDEPENDENT_AMBULATORY_CARE_PROVIDER_SITE_OTHER): Admitting: Neurosurgery

## 2024-08-07 ENCOUNTER — Encounter: Payer: Self-pay | Admitting: Neurosurgery

## 2024-08-07 VITALS — BP 107/69 | HR 68 | Ht 67.0 in | Wt 198.0 lb

## 2024-08-07 DIAGNOSIS — Q2730 Arteriovenous malformation, site unspecified: Secondary | ICD-10-CM

## 2024-08-07 NOTE — Progress Notes (Signed)
 Assessment : 53 year old lady with a complex past medical history who up until 5 years ago was doing very well and very active and going to the gym multiple times a week.  She slowly started having back pain and felt like there was a lead in her legs and had difficulty walking.  This has slowly gotten worse and over time has been progressive.  She also started having neck pain and this has progressed to a point now that she cannot sleep at night because of the pain in her neck.  She says that her gait is very poor and she has to drag her legs.  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  Patient was referred to [facility] where the recommendation was made to get genetic testing as well as an angiogram to better delineate this vascular malformation.  Patient did not feel that she was treated appropriately over there and never followed up.  She got very depressed and continues to be so.  She has been evaluated by psychiatry as well and remains under treatment.  Patient is extremely desperate and in tears in clinic because of the pain and her inability to do anything at all, be the mother that she wants to be as well as the wife.  She mainly describes the pain in her back and dysfunction of her legs.  Patient is a retired Engineer, civil (consulting).  Plan : On examination there is definitely some loss of sensation in her lower extremity and there could be polyneuropathy at play but this definitely does not explain the lower back pain and neck pain.  She is barely walking straight and has circumduction of her left leg upon walking.  I am concerned about her vascular malformations and as was noted in 2023 at [facility], she needs a full workup of her neuraxis to rule out any vascular malformation underlying this deterioration in her  clinical condition.  To that end, I will get an MRI of the cervical thoracic and lumbar spine without contrast [of note, she has contrast allergy if any have to ever in the future do a contrasted study she will need prophylactic steroids].  I will see her back in a few weeks after these MRIs have been done.   Social History   Socioeconomic History   Marital status: Married    Spouse name: Not on file   Number of children: 3   Years of education: Not on file   Highest education level: Associate degree: occupational, Scientist, product/process development, or vocational program  Occupational History   Occupation: Retired Charity fundraiser  Tobacco Use   Smoking status: Former    Types: Cigarettes    Passive exposure: Never   Smokeless tobacco: Never   Tobacco comments:    Smoked when she was 13 for about a week- 09/03/23  Vaping Use   Vaping status: Never Used  Substance and Sexual Activity   Alcohol use: Never   Drug use: Never   Sexual activity: Not on file  Other Topics Concern   Not on file  Social History Narrative   Not on file   Social Drivers of Health   Financial Resource Strain: Low Risk  (05/21/2024)   Overall Financial Resource Strain (CARDIA)    Difficulty of Paying Living Expenses: Not hard at all  Food Insecurity:  Low Risk  (07/03/2024)   Received from [facility]   Hunger Vital Sign    Within the past 12 months, you worried that your food would run out before you got money to buy more: Never true    Within the past 12 months, the food you bought just didn't last and you didn't have money to get more. : Never true  Transportation Needs: No Transportation Needs (07/03/2024)   Received from [facility]   Transportation    In the past 12 months, has lack of reliable transportation kept you from medical appointments, meetings, work or from getting things needed for daily living? : No  Physical Activity: Sufficiently Active (05/21/2024)   Exercise Vital Sign    Days of Exercise per Week: 7 days     Minutes of Exercise per Session: 40 min  Stress: No Stress Concern Present (05/21/2024)   [employer] of Occupational Health - Occupational Stress Questionnaire    Feeling of Stress: Not at all  Social Connections: Moderately Integrated (05/21/2024)   Social Connection and Isolation Panel    Frequency of Communication with Friends and Family: More than three times a week    Frequency of Social Gatherings with Friends and Family: Once a week    Attends Religious Services: 1 to 4 times per year    Active Member of [employer] or Organizations: No    Attends Engineer, structural: Not on file    Marital Status: Married  Catering manager Violence: Not on file    Family History  Problem Relation Age of Onset   Arthritis Mother    Hypertension Mother    Thyroid  disease Mother    Depression Mother    Migraines Mother    Deep vein thrombosis Mother        in her leg   Arthritis Father    Hypertension Father    Depression Father    CVA Father    Factor V Leiden deficiency Sister    CVA Sister        30s   Thyroid  disease Sister    Breast cancer Sister    Multiple sclerosis Sister    Other Sister        guillain barre   Depression Sister    Migraines Sister    CVA Sister    Thyroid  disease Sister    Depression Sister    Migraines Sister    Hypertension Maternal Grandmother    Depression Maternal Grandmother    Anxiety disorder Maternal Grandmother    Hypertension Maternal Grandfather    Hypertension Paternal Grandmother    Hypertension Paternal Grandfather    Asthma Daughter    Depression Daughter    Anxiety disorder Daughter    Irritable bowel syndrome Daughter    Lactose intolerance Daughter    Migraines Daughter    Asthma Son    Depression Son    Anxiety disorder Son    Irritable bowel syndrome Son    Lactose intolerance Son    Migraines Son    Asthma Son    Depression Son    Anxiety disorder Son    Irritable bowel syndrome Son    Lactose intolerance Son     Migraines Son    Asthma Niece    Other Niece        antiphospholipid AB syndrome   Colon cancer Neg Hx    Esophageal cancer Neg Hx    Rectal cancer Neg Hx    Stomach cancer Neg  Hx     Allergies  Allergen Reactions   Iodine  Anaphylaxis   Iohexol  Anaphylaxis    Pre meds given in past with no reaction noted.     Latex Anaphylaxis and Other (See Comments)    latex  Other Reaction(s): Not available, Other (See Comments)    latex   Topamax [Topiramate] Anaphylaxis   Zanaflex  [Tizanidine ] Shortness Of Breath    Swelling, chest pain    Prednisone      Other Reaction(s): SOB (All Steroids)    Past Medical History:  Diagnosis Date   Anorexia    Anxiety    Failed therapy with Paxil, Lexparo, Prozac, Wellbutrin , Xanax, and Ativan   Arthritis    Asthma    Bulimia    Cardiac disease    Angina   Chronic back pain    Constipation 08/26/2019   Depression    Failed therapy with Paxil, Lexparo, Prozac, Wellbutrin , and Ativan   Disease of thyroid  gland 12/28/2011   multiple thyroid  nodules in 2013 - US  on 03/08/14 showed heterogeneous appearance of thyroid  gland without focal nodule   Eczema    Generalized seizure (HCC)    stress related   GERD (gastroesophageal reflux disease)    History of DVT (deep vein thrombosis) 1988   Hypertension    Hyperthyroidism    Hypothyroidism    IBS (irritable bowel syndrome)    Internal derangement of left knee    Lactose intolerance    Migraine    Sleep apnea    wears CPAP   Vitamin D  deficiency     Past Surgical History:  Procedure Laterality Date   CESAREAN SECTION     x3   COLONOSCOPY     HEMANGIOMA EXCISION Right    shoulder   IR RADIOLOGIST EVAL & MGMT  05/04/2021   MASS EXCISION Right    lump from palm of hand   OOPHORECTOMY     OTHER SURGICAL HISTORY Right    Repair of tib-fib fracture   TOTAL ABDOMINAL HYSTERECTOMY     TOTAL KNEE ARTHROPLASTY Left 06/04/2023   Procedure: LEFT TOTAL KNEE ARTHROPLASTY;  Surgeon: Sheril Coy, MD;  Location: [facility];  Service: Orthopedics;  Laterality: Left;   TUBAL LIGATION     TUMOR EXCISION Right    Axilla - benign   VASCULAR SURGERY Right 1989   blood clot removed from neck     Physical Exam HENT:     Head: Normocephalic.     Nose: Nose normal.  Eyes:     Pupils: Pupils are equal, round, and reactive to light.  Neck:     Comments: Painful movement Cardiovascular:     Rate and Rhythm: Normal rate.  Pulmonary:     Effort: Pulmonary effort is normal.  Abdominal:     General: Abdomen is flat.  Neurological:     Mental Status: She is alert.     Cranial Nerves: Cranial nerves 2-12 are intact.     Sensory: Sensory deficit present.     Motor: Motor function is intact.     Coordination: Coordination is intact.     Comments: Sensory deficit below knees Strength testing restricted by pain Antalgic gait        Results for orders placed or performed during the hospital encounter of 04/06/22  MR Brain W Wo Contrast   Narrative   CLINICAL DATA:  53 year old female with muscle fatigue, vision changes, tingling sensation, abnormal gait, swallowing difficulty. Family history of multiple sclerosis.  EXAM: MRI  HEAD WITHOUT AND WITH CONTRAST  TECHNIQUE: Multiplanar, multiecho pulse sequences of the brain and surrounding structures were obtained without and with intravenous contrast.  CONTRAST:  9mL GADAVIST  GADOBUTROL  1 MMOL/ML IV SOLN  COMPARISON:  Head CT 07/29/2021.  FINDINGS: Brain: Cerebral volume is within normal limits for age. No restricted diffusion to suggest acute infarction. No midline shift, mass effect, evidence of mass lesion, ventriculomegaly, extra-axial collection or acute intracranial hemorrhage. Cervicomedullary junction and pituitary are within normal limits.  Elnor and white matter signal is within normal limits for age throughout the brain.  Incidental multiple developmental venous anomalies are noted (normal variation) including  in the left hemisphere on series 20, image 12, right hemisphere image 17, dorsal brainstem series 19, image 49, and right cerebellum series 20 image 11. No associated cavernous venous malformations of the brain. No chronic cerebral blood products or cortical encephalomalacia identified. Deep gray matter nuclei, brainstem are within normal limits.  No abnormal enhancement identified.  No dural thickening identified.  Vascular: Major intracranial vascular flow voids are preserved. The major dural venous sinuses are enhancing and appear to be patent.  Skull and upper cervical spine: Negative visible cervical spine. Visualized bone marrow signal is within normal limits.  Sinuses/Orbits: Negative orbits.  Paranasal sinuses are clear.  Other: Mastoids are clear. Visible internal auditory structures appear normal.  There is an intramuscular vascular malformation of the temporalis muscle on the right (series 18, image 21 and series 16, images 15 through 22) with numerous enhancing vessels (series 19, image 69) which seem to be communicating with an increased number of calvarium vessels in that region also. No definite scalp edema. Negative visible face soft tissues.  IMPRESSION: 1. No acute intracranial abnormality. Normal for age non contrast MRI appearance of the brain. No evidence of demyelinating disease.  2. 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   Electronically Signed   By: VEAR Hurst M.D.   On: 04/06/2022 09:13   Results for orders placed or performed during the hospital encounter of 07/29/21  CT HEAD WO CONTRAST ( )   Narrative   CLINICAL DATA:  Dizziness, shortness of breath, chest pressure, headache  EXAM: CT HEAD WITHOUT  CONTRAST  TECHNIQUE: Contiguous axial images were obtained from the base of the skull through the vertex without intravenous contrast.  COMPARISON:  None.  FINDINGS: Brain: 5 mm hyperdensity within the right basal ganglia most consistent with calcification. More subtle calcification within the left basal ganglia. No acute infarct or hemorrhage. Lateral ventricles and remaining midline structures are unremarkable. No acute extra-axial fluid collections. No mass effect.  Vascular: No hyperdense vessel or unexpected calcification.  Skull: Normal. Negative for fracture or focal lesion.  Sinuses/Orbits: No acute finding.  Other: None.  IMPRESSION: 1. No acute infarct or hemorrhage.   Electronically Signed   By: Ozell Daring M.D.   On: 07/29/2021 23:28

## 2024-08-11 ENCOUNTER — Telehealth: Payer: Self-pay

## 2024-08-11 ENCOUNTER — Other Ambulatory Visit (HOSPITAL_COMMUNITY): Payer: Self-pay

## 2024-08-11 NOTE — Telephone Encounter (Signed)
 Approved.

## 2024-08-11 NOTE — Telephone Encounter (Signed)
 Pharmacy Patient Advocate Encounter  Received notification from [facility] that Prior Authorization for Wegovy  0.25MG /0.5ML auto-injectors  has been APPROVED from 08/11/24 to 03/09/25   PA #/Case ID/Reference #: [id number]   Patient must enroll in [facility] MEMBER CALL [phone]    In accordance with your plan requirements, if you're taking an anti-obesity medication indicated for weight loss and you choose not to enroll, you'll be responsible for the entire cost of the medication, and it may not count towards your deductible or out-of-pocket maximum.

## 2024-08-11 NOTE — Telephone Encounter (Signed)
 Copied from CRM [id number]. Topic: Clinical - Prescription Issue >> Aug 11, 2024 12:40 PM DeAngela L wrote: Reason for CRM: patient the says [employer] will fax Prior Auth form to the office in 15 minutes They told the patient they are waiting for the Prior Auth to come over and will not fill the prescription for the patient until they get a Prior Auth  patient states she knows the  provider completed all that was needed She seen the Provider takes great care of her needs, but now she has not received the medication refill so she can get started  Patient not sure why [employer] has not been able to complete the Prior Auth, so [facility] it   semaglutide -weight management (WEGOVY ) 0.25 MG/0.5ML SOAJ SQ injection  Pt num [phone] (H)  Patient is emotional and upset and crying [facility] not so nice, [facility]  [facility] [place], [place] - [address] [facility] Portal to Registered [facility] [address] [place] [place] [address] Phone: [phone] Fax: [phone]

## 2024-08-11 NOTE — Telephone Encounter (Signed)
 Pharmacy Patient Advocate Encounter   Received notification from Pt Calls Messages that prior authorization for Wegovy  0.25MG /0.5ML auto-injectors  is required/requested.   Insurance verification completed.   The patient is insured through [facility] .   Per test claim: PA required; PA submitted to above mentioned insurance via Latent Key/confirmation #/EOC [id number] Status is pending

## 2024-08-11 NOTE — Telephone Encounter (Signed)
 PA request has been Submitted. New Encounter has been or will be created for follow up. For additional info see Pharmacy Prior Auth telephone encounter from 08/11/24.

## 2024-08-12 ENCOUNTER — Other Ambulatory Visit (HOSPITAL_COMMUNITY): Payer: Self-pay

## 2024-08-19 ENCOUNTER — Ambulatory Visit: Admitting: Family Medicine

## 2024-08-28 ENCOUNTER — Ambulatory Visit (HOSPITAL_COMMUNITY): Admission: RE | Admit: 2024-08-28 | Source: Ambulatory Visit

## 2024-08-28 ENCOUNTER — Ambulatory Visit (HOSPITAL_COMMUNITY)

## 2024-08-28 ENCOUNTER — Encounter (HOSPITAL_COMMUNITY): Payer: Self-pay

## 2024-09-15 ENCOUNTER — Ambulatory Visit: Admitting: Neurosurgery

## 2024-09-21 ENCOUNTER — Other Ambulatory Visit: Payer: Self-pay | Admitting: Family Medicine

## 2024-09-21 DIAGNOSIS — K581 Irritable bowel syndrome with constipation: Secondary | ICD-10-CM

## 2024-09-23 ENCOUNTER — Other Ambulatory Visit: Payer: Self-pay | Admitting: Family Medicine

## 2024-09-23 DIAGNOSIS — F5101 Primary insomnia: Secondary | ICD-10-CM

## 2024-10-07 ENCOUNTER — Ambulatory Visit: Admitting: Family Medicine

## 2024-10-13 ENCOUNTER — Ambulatory Visit: Payer: Self-pay | Admitting: Family Medicine

## 2024-10-13 ENCOUNTER — Encounter: Payer: Self-pay | Admitting: Family Medicine

## 2024-10-13 VITALS — BP 113/68 | HR 76 | Temp 97.3°F | Ht 67.0 in | Wt 193.4 lb

## 2024-10-13 DIAGNOSIS — K581 Irritable bowel syndrome with constipation: Secondary | ICD-10-CM

## 2024-10-13 DIAGNOSIS — Z6830 Body mass index (BMI) 30.0-30.9, adult: Secondary | ICD-10-CM | POA: Diagnosis not present

## 2024-10-13 DIAGNOSIS — E782 Mixed hyperlipidemia: Secondary | ICD-10-CM | POA: Diagnosis not present

## 2024-10-13 DIAGNOSIS — R110 Nausea: Secondary | ICD-10-CM

## 2024-10-13 DIAGNOSIS — I10 Essential (primary) hypertension: Secondary | ICD-10-CM | POA: Diagnosis not present

## 2024-10-13 DIAGNOSIS — E669 Obesity, unspecified: Secondary | ICD-10-CM | POA: Diagnosis not present

## 2024-10-13 DIAGNOSIS — Z8673 Personal history of transient ischemic attack (TIA), and cerebral infarction without residual deficits: Secondary | ICD-10-CM

## 2024-10-13 MED ORDER — LINACLOTIDE 290 MCG PO CAPS: 290.0000 ug | ORAL_CAPSULE | Freq: Every day | ORAL | 2 refills | Status: AC

## 2024-10-13 MED ORDER — ONDANSETRON HCL 4 MG PO TABS: 4.0000 mg | ORAL_TABLET | Freq: Three times a day (TID) | ORAL | 0 refills | Status: AC | PRN

## 2024-10-13 NOTE — Patient Instructions (Addendum)

## 2024-10-13 NOTE — Progress Notes (Signed)
 Subjective:  [name], female    DOB: 11-Aug-1971, 53 y.o.   MRN: [id number]  Patient Care Team: Severa Rock HERO, FNP as PCP - General (Family Medicine)   Chief Complaint:  BMI   HPI: Olivia Zamora is a 53 y.o. female presenting on 10/13/2024 for BMI    Olivia Zamora is a 53 year old female who presents for BMI follow up after starting Wegovy .   Wegovy  treatment and weight management - On Wegovy  for weight management for 10 weeks - Weight loss of 5 pounds since August; current weight is 193 pounds - No cravings, sugar fluctuations, or histamine reactions when eating - Improved inflammation with no swelling - No back pain and improved balance when walking - No need for brace or walker  Gastrointestinal symptoms - Significant nausea leading to decreased fluid intake and poor appetite - Occasional use of Zofran  for nausea relief - Severe constipation with minimal bowel movement despite use of multiple over-the-counter laxatives (Miralax, Senokot, Dulcolax, magnesium citrate, milk of magnesia) - Minimal gastrointestinal activity after taking two Linzess  tablets - Abdominal pain associated with constipation  Dietary intake and functional impact - Diet limited due to nausea, often only able to consume small amounts of liquids and food (e.g., half a hamburger or a small bowl of soup) - Decreased energy levels and reduced ability to exercise  Psychological symptoms - No anxiety - Feels 'normal' overall          Relevant past medical, surgical, family, and social history reviewed and updated as indicated.  Allergies and medications reviewed and updated. Data reviewed: Chart in Epic.   Past Medical History:  Diagnosis Date   Anorexia    Anxiety    Failed therapy with Paxil, Lexparo, Prozac, Wellbutrin , Xanax, and Ativan   Arthritis    Asthma    Bulimia (HCC)    Cardiac disease    Angina   Chronic back pain    Constipation 08/26/2019   Depression     Failed therapy with Paxil, Lexparo, Prozac, Wellbutrin , and Ativan   Disease of thyroid  gland 12/28/2011   multiple thyroid  nodules in 2013 - US  on 03/08/14 showed heterogeneous appearance of thyroid  gland without focal nodule   Eczema    Generalized seizure (HCC)    stress related   GERD (gastroesophageal reflux disease)    History of DVT (deep vein thrombosis) 1988   Hypertension    Hyperthyroidism    Hypothyroidism    IBS (irritable bowel syndrome)    Internal derangement of left knee    Lactose intolerance    Migraine    Sleep apnea    wears CPAP   Vitamin D  deficiency     Past Surgical History:  Procedure Laterality Date   CESAREAN SECTION     x3   COLONOSCOPY     HEMANGIOMA EXCISION Right    shoulder   IR RADIOLOGIST EVAL & MGMT  05/04/2021   MASS EXCISION Right    lump from palm of hand   OOPHORECTOMY     OTHER SURGICAL HISTORY Right    Repair of tib-fib fracture   TOTAL ABDOMINAL HYSTERECTOMY     TOTAL KNEE ARTHROPLASTY Left 06/04/2023   Procedure: LEFT TOTAL KNEE ARTHROPLASTY;  Surgeon: Sheril Coy, MD;  Location: [facility];  Service: Orthopedics;  Laterality: Left;   TUBAL LIGATION     TUMOR EXCISION Right    Axilla - benign   VASCULAR SURGERY Right 1989   blood clot  removed from neck    Social History   Socioeconomic History   Marital status: Married    Spouse name: Not on file   Number of children: 3   Years of education: Not on file   Highest education level: Associate degree: occupational, scientist, product/process development, or vocational program  Occupational History   Occupation: Retired [employer]  Tobacco Use   Smoking status: Former    Types: Cigarettes    Passive exposure: Never   Smokeless tobacco: Never   Tobacco comments:    Smoked when she was 13 for about a week- 09/03/23  Vaping Use   Vaping status: Never Used  Substance and Sexual Activity   Alcohol use: Never   Drug use: Never   Sexual activity: Not on file  Other Topics Concern   Not on file  Social  History Narrative   Not on file   Social Drivers of Health   Financial Resource Strain: Low Risk  (10/11/2024)   Overall Financial Resource Strain (CARDIA)    Difficulty of Paying Living Expenses: Not hard at all  Food Insecurity: No Food Insecurity (10/11/2024)   Hunger Vital Sign    Worried About Running Out of Food in the Last Year: Never true    Ran Out of Food in the Last Year: Never true  Transportation Needs: No Transportation Needs (10/11/2024)   PRAPARE - Administrator, Civil Service (Medical): No    Lack of Transportation (Non-Medical): No  Physical Activity: Insufficiently Active (10/11/2024)   Exercise Vital Sign    Days of Exercise per Week: 2 days    Minutes of Exercise per Session: 20 min  Stress: No Stress Concern Present (10/11/2024)   [employer] of Occupational Health - Occupational Stress Questionnaire    Feeling of Stress: Not at all  Social Connections: Moderately Isolated (10/11/2024)   Social Connection and Isolation Panel    Frequency of Communication with Friends and Family: More than three times a week    Frequency of Social Gatherings with Friends and Family: Once a week    Attends Religious Services: Never    Database Administrator or Organizations: No    Attends Engineer, Structural: Not on file    Marital Status: Married  Intimate Partner Violence: Not on file    Outpatient Encounter Medications as of 10/13/2024  Medication Sig   acetaminophen  (TYLENOL ) 500 MG tablet Take 500 mg by mouth every 8 (eight) hours as needed for moderate pain (pain score 4-6).   aspirin  EC 81 MG tablet Take 81 mg by mouth daily.   Cyanocobalamin 5000 MCG/ML LIQD Place under the tongue.   docusate sodium  (COLACE) 100 MG capsule Take 1 capsule (100 mg total) by mouth daily. (Patient taking differently: Take 100 mg by mouth 2 (two) times daily.)   Fremanezumab -vfrm (AJOVY ) 225 MG/1.5ML SOAJ Inject 225 mg into the skin every 30 (thirty) days.    furosemide (LASIX) 20 MG tablet Take 20 mg by mouth daily.   levalbuterol  (XOPENEX  HFA) 45 MCG/ACT inhaler Inhale 2 puffs into the lungs every 4 (four) hours as needed for wheezing or shortness of breath (coughing fits).   levalbuterol  (XOPENEX ) 0.63 MG/3ML nebulizer solution TAKE 3 MLS BY NEBULIZATION EVERY 4 HOURS AS NEEDED FOR WHEEZING, SHORTNESS OF BREATH OR COUGH FITS   linaclotide  (LINZESS ) 290 MCG CAPS capsule Take 1 capsule (290 mcg total) by mouth daily before breakfast.   losartan  (COZAAR ) 50 MG tablet Take 1 tablet (50 mg total) by  mouth daily.   Multiple Vitamins-Minerals (MULTIVITAMIN ADULT PO) Take 1 tablet by mouth daily.   ondansetron  (ZOFRAN ) 4 MG tablet Take 1 tablet (4 mg total) by mouth every 8 (eight) hours as needed for nausea or vomiting.   pantoprazole  (PROTONIX ) 40 MG tablet Take 1 tablet (40 mg total) by mouth 2 (two) times daily.   Rimegepant Sulfate (NURTEC) 75 MG TBDP Take 1 tablet (75 mg total) by mouth as needed (migraine).   semaglutide -weight management (WEGOVY ) 1 MG/0.5ML SOAJ SQ injection Inject 1 mg into the skin once a week for 4 doses.   [START ON 11/01/2024] semaglutide -weight management (WEGOVY ) 1.7 MG/0.75ML SOAJ SQ injection Inject 1.7 mg into the skin once a week for 4 doses.   [START ON 11/30/2024] semaglutide -weight management (WEGOVY ) 2.4 MG/0.75ML SOAJ SQ injection Inject 2.4 mg into the skin once a week for 4 doses.   SYNTHROID  100 MCG tablet Take 100 mcg by mouth daily at 6 (six) AM.   Tiotropium Bromide Monohydrate  (SPIRIVA  RESPIMAT) 1.25 MCG/ACT AERS INHALE 2 PUFFS INTO THE LUNGS ONCE DAILY   Vitamin D , Ergocalciferol , (DRISDOL) 1.25 MG (50000 UT) CAPS capsule Take 50,000 Units by mouth every 7 (seven) days.   [DISCONTINUED] linaclotide  (LINZESS ) 72 MCG capsule TAKE 1 CAPSULE BY MOUTH EVERY DAY BEFORE BREAKFAST   No facility-administered encounter medications on file as of 10/13/2024.    Allergies  Allergen Reactions   Iodine  Anaphylaxis    Iohexol  Anaphylaxis    Pre meds given in past with no reaction noted.     Latex Anaphylaxis and Other (See Comments)    latex  Other Reaction(s): Not available, Other (See Comments)    latex   Topamax [Topiramate] Anaphylaxis   Zanaflex  [Tizanidine ] Shortness Of Breath    Swelling, chest pain    Prednisone      Other Reaction(s): SOB (All Steroids)    Pertinent ROS per HPI, otherwise unremarkable      Objective:  BP 113/68   Pulse 76   Temp (!) 97.3 F (36.3 C)   Ht 5' 7 (1.702 m)   Wt 193 lb 6.4 oz (87.7 kg)   SpO2 97%   BMI 30.29 kg/m    Wt Readings from Last 3 Encounters:  10/13/24 193 lb 6.4 oz (87.7 kg)  08/07/24 198 lb (89.8 kg)  08/06/24 197 lb (89.4 kg)    Physical Exam Vitals and nursing note reviewed.  Constitutional:      General: She is not in acute distress.    Appearance: Normal appearance. She is obese. She is not ill-appearing, toxic-appearing or diaphoretic.  HENT:     Head: Normocephalic and atraumatic.     Mouth/Throat:     Mouth: Mucous membranes are moist.  Eyes:     Pupils: Pupils are equal, round, and reactive to light.  Cardiovascular:     Rate and Rhythm: Normal rate and regular rhythm.     Heart sounds: Normal heart sounds.  Pulmonary:     Effort: Pulmonary effort is normal.     Breath sounds: Normal breath sounds.  Musculoskeletal:     Cervical back: Neck supple.     Right lower leg: No edema.     Left lower leg: No edema.  Skin:    General: Skin is warm and dry.     Capillary Refill: Capillary refill takes less than 2 seconds.  Neurological:     General: No focal deficit present.     Mental Status: She is alert and oriented to person,  place, and time.  Psychiatric:        Mood and Affect: Mood normal.        Behavior: Behavior normal.        Thought Content: Thought content normal.        Judgment: Judgment normal.    Results for orders placed or performed in visit on 05/22/24  Bayer DCA Hb A1c Waived   Collection  Time: 05/22/24  9:14 AM  Result Value Ref Range   HB A1C (BAYER DCA - WAIVED) 5.3 4.8 - 5.6 %  CBC with Differential/Platelet   Collection Time: 05/22/24  9:24 AM  Result Value Ref Range   WBC 3.7 3.4 - 10.8 x10E3/uL   RBC 4.88 3.77 - 5.28 x10E6/uL   Hemoglobin 15.1 11.1 - 15.9 g/dL   Hematocrit 54.1 65.9 - 46.6 %   MCV 94 79 - 97 fL   MCH 30.9 26.6 - 33.0 pg   MCHC 33.0 31.5 - 35.7 g/dL   RDW 86.8 88.2 - 84.5 %   Platelets 170 150 - 450 x10E3/uL   Neutrophils 50 Not Estab. %   Lymphs 37 Not Estab. %   Monocytes 11 Not Estab. %   Eos 2 Not Estab. %   Basos 0 Not Estab. %   Neutrophils Absolute 1.9 1.4 - 7.0 x10E3/uL   Lymphocytes Absolute 1.4 0.7 - 3.1 x10E3/uL   Monocytes Absolute 0.4 0.1 - 0.9 x10E3/uL   EOS (ABSOLUTE) 0.1 0.0 - 0.4 x10E3/uL   Basophils Absolute 0.0 0.0 - 0.2 x10E3/uL   Immature Granulocytes 0 Not Estab. %   Immature Grans (Abs) 0.0 0.0 - 0.1 x10E3/uL  CMP14+EGFR   Collection Time: 05/22/24  9:24 AM  Result Value Ref Range   Glucose 126 (H) 70 - 99 mg/dL   BUN 16 6 - 24 mg/dL   Creatinine, Ser 9.28 0.57 - 1.00 mg/dL   eGFR 899 >40 fO/fpw/8.26   BUN/Creatinine Ratio 23 9 - 23   Sodium 146 (H) 134 - 144 mmol/L   Potassium 4.2 3.5 - 5.2 mmol/L   Chloride 101 96 - 106 mmol/L   CO2 25 20 - 29 mmol/L   Calcium 9.8 8.7 - 10.2 mg/dL   Total Protein 6.7 6.0 - 8.5 g/dL   Albumin  4.6 3.8 - 4.9 g/dL   Globulin, Total 2.1 1.5 - 4.5 g/dL   Bilirubin Total 0.9 0.0 - 1.2 mg/dL   Alkaline Phosphatase 94 44 - 121 IU/L   AST 26 0 - 40 IU/L   ALT 40 (H) 0 - 32 IU/L  Lipid panel   Collection Time: 05/22/24  9:24 AM  Result Value Ref Range   Cholesterol, Total 181 100 - 199 mg/dL   Triglycerides 843 (H) 0 - 149 mg/dL   HDL 36 (L) >60 mg/dL   VLDL Cholesterol Cal 28 5 - 40 mg/dL   LDL Chol Calc (NIH) 882 (H) 0 - 99 mg/dL   Chol/HDL Ratio 5.0 (H) 0.0 - 4.4 ratio  Thyroid  Panel With TSH   Collection Time: 05/22/24  9:24 AM  Result Value Ref Range   TSH 0.423 (L)  0.450 - 4.500 uIU/mL   T4, Total 10.6 4.5 - 12.0 ug/dL   T3 Uptake Ratio 27 24 - 39 %   Free Thyroxine Index 2.9 1.2 - 4.9  Vitamin B12   Collection Time: 05/22/24  9:54 AM  Result Value Ref Range   Vitamin B-12 509 232 - 1,245 pg/mL  VITAMIN D  25 Hydroxy (Vit-D Deficiency,  Fractures)   Collection Time: 05/22/24  9:54 AM  Result Value Ref Range   Vit D, 25-Hydroxy 66.6 30.0 - 100.0 ng/mL  Hepatitis C antibody   Collection Time: 05/22/24  9:54 AM  Result Value Ref Range   Hep C Virus Ab Non Reactive Non Reactive  HIV Antibody (routine testing w rflx)   Collection Time: 05/22/24  9:54 AM  Result Value Ref Range   HIV Screen 4th Generation wRfx Non Reactive Non Reactive       Pertinent labs & imaging results that were available during my care of the patient were reviewed by me and considered in my medical decision making.  Assessment & Plan:  Tanaiya was seen today for bmi.  Diagnoses and all orders for this visit:  BMI 30.0-30.9,adult -     TSH -     T4, Free -     CMP14+EGFR  Essential hypertension -     TSH -     T4, Free -     CMP14+EGFR  Mixed hyperlipidemia -     TSH -     T4, Free -     CMP14+EGFR  H/O: CVA (cerebrovascular accident) -     TSH -     T4, Free -     CMP14+EGFR  Irritable bowel syndrome with constipation -     linaclotide  (LINZESS ) 290 MCG CAPS capsule; Take 1 capsule (290 mcg total) by mouth daily before breakfast.  Nausea in adult -     ondansetron  (ZOFRAN ) 4 MG tablet; Take 1 tablet (4 mg total) by mouth every 8 (eight) hours as needed for nausea or vomiting.      Obesity Management with Wegovy  for 10 weeks. Reports feeling good with no cravings or sugar fluctuations. Weight decreased by 5 pounds since August. Insurance covers medication as long as she remains in the program and attends monthly nutritionist appointments. - Continue Wegovy  with dose escalation to 1.7 mg and then 2.4 mg as scheduled. - Encouraged small frequent meals and  increased protein intake. - Ensured monthly nutritionist appointments are maintained for insurance coverage.  Irritable bowel syndrome with constipation Severe constipation as a side effect of Wegovy . Tried multiple laxatives without relief, including Miralax, Senokot, Dulcolax, magnesium citrate, milk of magnesia, and Linzess . Reports significant abdominal pain and inability to pass gas. Constipation is a known side effect of Wegovy . - Increased Linzess  dose to 290 mcg. - Encouraged increased water  intake and dietary fiber. - Advised on small frequent meals to prevent nausea and aid digestion.  Nausea Experiencing nausea, likely related to Wegovy . Has been using Zofran  as needed. Nausea exacerbated by inadequate food intake. - Prescribed Zofran  for nausea as needed. - Advised on small frequent meals to prevent nausea. - Encouraged increased protein intake.  General Health Maintenance Routine monitoring of liver, kidney, and thyroid  function due to Wegovy  use. - Ordered liver, kidney, and thyroid  function tests.          Continue all other maintenance medications.  Follow up plan: Return in about 8 weeks (around 12/08/2024), or if symptoms worsen or fail to improve, for BMI.   Continue healthy lifestyle choices, including diet (rich in fruits, vegetables, and lean proteins, and low in salt and simple carbohydrates) and exercise (at least 30 minutes of moderate physical activity daily).  Educational handout given for GLP-1 success tips  The above assessment and management plan was discussed with the patient. The patient verbalized understanding of and has agreed to the management plan.  Patient is aware to call the clinic if they develop any new symptoms or if symptoms persist or worsen. Patient is aware when to return to the clinic for a follow-up visit. Patient educated on when it is appropriate to go to the emergency department.   Rosaline Bruns, FNP-C [facility] [phone]

## 2024-10-14 ENCOUNTER — Ambulatory Visit: Payer: Self-pay | Admitting: Family Medicine

## 2024-10-14 LAB — CMP14+EGFR
ALT: 40 IU/L — ABNORMAL HIGH (ref 0–32)
AST: 27 IU/L (ref 0–40)
Albumin: 4.6 g/dL (ref 3.8–4.9)
Alkaline Phosphatase: 78 IU/L (ref 49–135)
BUN/Creatinine Ratio: 21 (ref 9–23)
BUN: 15 mg/dL (ref 6–24)
Bilirubin Total: 0.9 mg/dL (ref 0.0–1.2)
CO2: 26 mmol/L (ref 20–29)
Calcium: 9.6 mg/dL (ref 8.7–10.2)
Chloride: 101 mmol/L (ref 96–106)
Creatinine, Ser: 0.71 mg/dL (ref 0.57–1.00)
Globulin, Total: 1.6 g/dL (ref 1.5–4.5)
Glucose: 97 mg/dL (ref 70–99)
Potassium: 4.1 mmol/L (ref 3.5–5.2)
Sodium: 143 mmol/L (ref 134–144)
Total Protein: 6.2 g/dL (ref 6.0–8.5)
eGFR: 100 mL/min/1.73 (ref >59)

## 2024-10-14 LAB — TSH: TSH: 1.25 u[IU]/mL (ref 0.450–4.500)

## 2024-10-14 LAB — T4, FREE: Free T4: 1.75 ng/dL (ref 0.82–1.77)

## 2024-10-17 ENCOUNTER — Encounter: Payer: Self-pay | Admitting: Neurosurgery

## 2024-10-26 ENCOUNTER — Ambulatory Visit (HOSPITAL_COMMUNITY)

## 2024-10-26 ENCOUNTER — Ambulatory Visit (HOSPITAL_COMMUNITY)
Admission: RE | Admit: 2024-10-26 | Discharge: 2024-10-26 | Disposition: A | Discharge status: Home / Self Care | Source: Ambulatory Visit | Attending: Neurosurgery | Admitting: Neurosurgery

## 2024-10-26 DIAGNOSIS — Q2730 Arteriovenous malformation, site unspecified: Secondary | ICD-10-CM

## 2024-10-26 MED ORDER — GADOBUTROL 1 MMOL/ML IV SOLN
7.0000 mL | Freq: Once | INTRAVENOUS | Status: AC | PRN
  Administered 2024-10-26: 7 mL via INTRAVENOUS

## 2024-11-01 ENCOUNTER — Encounter: Payer: Self-pay | Admitting: Neurosurgery

## 2024-11-10 ENCOUNTER — Ambulatory Visit: Admitting: Neurosurgery

## 2024-11-10 ENCOUNTER — Encounter: Payer: Self-pay | Admitting: Neurosurgery

## 2024-11-10 ENCOUNTER — Encounter: Payer: Self-pay | Admitting: Diagnostic Neuroimaging

## 2024-11-10 VITALS — BP 102/72 | HR 78 | Temp 98.2°F | Ht 67.0 in | Wt 192.2 lb

## 2024-11-10 DIAGNOSIS — Q2730 Arteriovenous malformation, site unspecified: Secondary | ICD-10-CM | POA: Diagnosis not present

## 2024-11-10 NOTE — Progress Notes (Signed)
 53 year old lady with a history of a right temporal muscle AVM who was evaluated by [facility] and had an angiogram which did not show any intracerebral involvement.  She was recommended to pursue genetic testing but due to a bad experience the patient did not follow-up.  Patient was being seen by one of our neurologists for headache management we referred her here for evaluation.  At her last visit with me patient had numbness in both legs and was walking with a spastic gait with circumduction.  I ordered an MRI of the cervical thoracic and lumbar area and fortunately none of these show any vascular abnormalities.  There is some mention of some degenerative problems but definitely nothing that would explain the circumducted gait.  At today's visit she is walking completely normally and this was aided by the fact that she was also told that the MRIs were free of any vascular abnormalities in her spinal cord.  I had a lengthy conversation with her and her husband about this.  I do think that she needs genetic evaluation but she does not want to go to [facility].  I looked up the options and there is a vascular abnormalities clinic at [facility] and they took a picture of this and will talk to her primary care doctor to get the referral to them.  I think this is important not just for her but also for her children and grandchildren.  From my standpoint I do not think that there is any need for neurosurgical evaluation or follow-up.  If there is ever anything I can do for her, she is welcome to come and see me.

## 2024-11-12 ENCOUNTER — Ambulatory Visit
Admission: RE | Admit: 2024-11-12 | Discharge: 2024-11-12 | Disposition: A | Source: Ambulatory Visit | Attending: Gastroenterology

## 2024-11-12 ENCOUNTER — Encounter: Payer: Self-pay | Admitting: Gastroenterology

## 2024-11-12 ENCOUNTER — Ambulatory Visit: Admitting: Gastroenterology

## 2024-11-12 ENCOUNTER — Other Ambulatory Visit

## 2024-11-12 ENCOUNTER — Ambulatory Visit: Payer: Self-pay | Admitting: Gastroenterology

## 2024-11-12 VITALS — BP 130/82 | HR 64 | Ht 67.0 in

## 2024-11-12 DIAGNOSIS — R194 Change in bowel habit: Secondary | ICD-10-CM

## 2024-11-12 DIAGNOSIS — K5900 Constipation, unspecified: Secondary | ICD-10-CM

## 2024-11-12 DIAGNOSIS — K219 Gastro-esophageal reflux disease without esophagitis: Secondary | ICD-10-CM

## 2024-11-12 DIAGNOSIS — R110 Nausea: Secondary | ICD-10-CM

## 2024-11-12 DIAGNOSIS — R1013 Epigastric pain: Secondary | ICD-10-CM

## 2024-11-12 LAB — CBC WITH DIFFERENTIAL/PLATELET
Basophils Absolute: 0 K/uL (ref 0.0–0.1)
Basophils Relative: 0.2 % (ref 0.0–3.0)
Eosinophils Absolute: 0.1 K/uL (ref 0.0–0.7)
Eosinophils Relative: 2.5 % (ref 0.0–5.0)
HCT: 43.7 % (ref 36.0–46.0)
Hemoglobin: 15 g/dL (ref 12.0–15.0)
Lymphocytes Relative: 31 % (ref 12.0–46.0)
Lymphs Abs: 1.3 K/uL (ref 0.7–4.0)
MCHC: 34.3 g/dL (ref 30.0–36.0)
MCV: 90.3 fl (ref 78.0–100.0)
Monocytes Absolute: 0.4 K/uL (ref 0.1–1.0)
Monocytes Relative: 9.4 % (ref 3.0–12.0)
Neutro Abs: 2.3 K/uL (ref 1.4–7.7)
Neutrophils Relative %: 56.9 % (ref 43.0–77.0)
Platelets: 182 K/uL (ref 150.0–400.0)
RBC: 4.84 Mil/uL (ref 3.87–5.11)
RDW: 13.1 % (ref 11.5–15.5)
WBC: 4.1 K/uL (ref 4.0–10.5)

## 2024-11-12 LAB — COMPREHENSIVE METABOLIC PANEL WITH GFR
ALT: 26 U/L (ref 0–35)
AST: 21 U/L (ref 0–37)
Albumin: 4.7 g/dL (ref 3.5–5.2)
Alkaline Phosphatase: 63 U/L (ref 39–117)
BUN: 10 mg/dL (ref 6–23)
CO2: 31 meq/L (ref 19–32)
Calcium: 9.6 mg/dL (ref 8.4–10.5)
Chloride: 104 meq/L (ref 96–112)
Creatinine, Ser: 0.6 mg/dL (ref 0.40–1.20)
GFR: 100.86 mL/min (ref >60.00)
Glucose, Bld: 99 mg/dL (ref 70–99)
Potassium: 4.5 meq/L (ref 3.5–5.1)
Sodium: 142 meq/L (ref 135–145)
Total Bilirubin: 0.6 mg/dL (ref 0.2–1.2)
Total Protein: 7.1 g/dL (ref 6.0–8.3)

## 2024-11-12 LAB — LIPASE: Lipase: 20 U/L (ref 11.0–59.0)

## 2024-11-12 NOTE — Progress Notes (Signed)
 Chief Complaint:epigastric pain, constipation  Primary GI Doctor:Dr. Federico  HPI:  Patient is a  53  year old female patient with past medical history of GERD, constipation, who was self referred to me for a evaluation of epigastric pain, constipation .    Interval History Patient last seen in the GI office on 09/16/2023 by [facility], [facility] for evaluation of epigastric pain.  Presents for follow-up for evaluation of epigastric pain, worsening constipation, and nausea. Patient has history of GERD and currently taking Pantoprazole  40 mg twice daily. Patient reports RUQ pain that has increased in severity and frequency in last couple of months. She reports the pain seems to have increased with starting her Wegovy . It radiates up into her chest. No NSAID use.  She also has hx of chronic constipation and has experienced severe constipation as a side effect of Wegovy . Tried multiple laxatives without relief, including Miralax, Senokot, Dulcolax, magnesium citrate, milk of magnesia, and Linzess  lower dose. Reports significant abdominal pain and inability to pass gas. Has tried Amitiza in past and did not work. Her PCP recently increased Linzess  to 290 mcg, with not much result. She hasn't had BM in 7-8 days.   Patient also experiencing nausea,no vomiting.  She also feels this is related to Wegovy . She is using Zofran  as needed. She reports she has not had much to eat today, 2 sips of a flavored drink.   Wt Readings from Last 3 Encounters:  11/10/24 192 lb 3.2 oz (87.2 kg)  10/13/24 193 lb 6.4 oz (87.7 kg)  08/07/24 198 lb (89.8 kg)    Past Medical History:  Diagnosis Date   Anorexia    Anxiety    Failed therapy with Paxil, Lexparo, Prozac, Wellbutrin , Xanax, and Ativan   Arthritis    Asthma    Bulimia (HCC)    Cardiac disease    Angina   Chronic back pain    Constipation 08/26/2019   Depression    Failed therapy with Paxil, Lexparo, Prozac, Wellbutrin , and Ativan   Disease of thyroid  gland  12/28/2011   multiple thyroid  nodules in 2013 - US  on 03/08/14 showed heterogeneous appearance of thyroid  gland without focal nodule   Eczema    Generalized seizure (HCC)    stress related   GERD (gastroesophageal reflux disease)    History of DVT (deep vein thrombosis) 1988   Hypertension    Hyperthyroidism    Hypothyroidism    IBS (irritable bowel syndrome)    Internal derangement of left knee    Lactose intolerance    Migraine    Sleep apnea    wears CPAP   Vitamin D  deficiency     Past Surgical History:  Procedure Laterality Date   CESAREAN SECTION     x3   COLONOSCOPY     HEMANGIOMA EXCISION Right    shoulder   IR RADIOLOGIST EVAL & MGMT  05/04/2021   MASS EXCISION Right    lump from palm of hand   OOPHORECTOMY     OTHER SURGICAL HISTORY Right    Repair of tib-fib fracture   TOTAL ABDOMINAL HYSTERECTOMY     TOTAL KNEE ARTHROPLASTY Left 06/04/2023   Procedure: LEFT TOTAL KNEE ARTHROPLASTY;  Surgeon: Sheril Coy, MD;  Location: [facility];  Service: Orthopedics;  Laterality: Left;   TUBAL LIGATION     TUMOR EXCISION Right    Axilla - benign   VASCULAR SURGERY Right 1989   blood clot removed from neck    Current Outpatient Medications  Medication Sig Dispense Refill   acetaminophen  (TYLENOL ) 500 MG tablet Take 500 mg by mouth every 8 (eight) hours as needed for moderate pain (pain score 4-6).     aspirin  EC 81 MG tablet Take 81 mg by mouth daily.     Cyanocobalamin 5000 MCG/ML LIQD Place under the tongue.     docusate sodium  (COLACE) 100 MG capsule Take 1 capsule (100 mg total) by mouth daily. (Patient taking differently: Take 100 mg by mouth 2 (two) times daily.) 90 capsule 1   Fremanezumab -vfrm (AJOVY ) 225 MG/1.5ML SOAJ Inject 225 mg into the skin every 30 (thirty) days. 4.5 mL 4   furosemide (LASIX) 20 MG tablet Take 20 mg by mouth daily.     levalbuterol  (XOPENEX  HFA) 45 MCG/ACT inhaler Inhale 2 puffs into the lungs every 4 (four) hours as needed for wheezing or  shortness of breath (coughing fits). 3 each 3   levalbuterol  (XOPENEX ) 0.63 MG/3ML nebulizer solution TAKE 3 MLS BY NEBULIZATION EVERY 4 HOURS AS NEEDED FOR WHEEZING, SHORTNESS OF BREATH OR COUGH FITS 90 mL 1   linaclotide  (LINZESS ) 290 MCG CAPS capsule Take 1 capsule (290 mcg total) by mouth daily before breakfast. 90 capsule 2   losartan  (COZAAR ) 50 MG tablet Take 1 tablet (50 mg total) by mouth daily. 90 tablet 1   Multiple Vitamins-Minerals (MULTIVITAMIN ADULT PO) Take 1 tablet by mouth daily.     ondansetron  (ZOFRAN ) 4 MG tablet Take 1 tablet (4 mg total) by mouth every 8 (eight) hours as needed for nausea or vomiting. 20 tablet 0   pantoprazole  (PROTONIX ) 40 MG tablet Take 1 tablet (40 mg total) by mouth 2 (two) times daily. 90 tablet 3   Rimegepant Sulfate (NURTEC) 75 MG TBDP Take 1 tablet (75 mg total) by mouth as needed (migraine). 8 tablet 12   semaglutide -weight management (WEGOVY ) 1.7 MG/0.75ML SOAJ SQ injection Inject 1.7 mg into the skin once a week for 4 doses. 3 mL 0   [START ON 11/30/2024] semaglutide -weight management (WEGOVY ) 2.4 MG/0.75ML SOAJ SQ injection Inject 2.4 mg into the skin once a week for 4 doses. 3 mL 0   SYNTHROID  100 MCG tablet Take 100 mcg by mouth daily at 6 (six) AM.     Tiotropium Bromide Monohydrate  (SPIRIVA  RESPIMAT) 1.25 MCG/ACT AERS INHALE 2 PUFFS INTO THE LUNGS ONCE DAILY 4 g 10   Vitamin D , Ergocalciferol , (DRISDOL) 1.25 MG (50000 UT) CAPS capsule Take 50,000 Units by mouth every 7 (seven) days.     No current facility-administered medications for this visit.    Allergies as of 11/12/2024 - Review Complete 11/12/2024  Allergen Reaction Noted   Iodine  Anaphylaxis 08/19/2019   Iohexol  Anaphylaxis 07/30/2021   Latex Anaphylaxis and Other (See Comments) 08/19/2019   Topamax [topiramate] Anaphylaxis 08/19/2019   Zanaflex  [tizanidine ] Shortness Of Breath 06/24/2023   Prednisone   08/07/2024    Family History  Problem Relation Age of Onset   Arthritis  Mother    Hypertension Mother    Thyroid  disease Mother    Depression Mother    Migraines Mother    Deep vein thrombosis Mother        in her leg   Arthritis Father    Hypertension Father    Depression Father    CVA Father    Factor V Leiden deficiency Sister    CVA Sister        30s   Thyroid  disease Sister    Breast cancer Sister    Multiple sclerosis  Sister    Other Sister        guillain barre   Depression Sister    Migraines Sister    CVA Sister    Thyroid  disease Sister    Depression Sister    Migraines Sister    Hypertension Maternal Grandmother    Depression Maternal Grandmother    Anxiety disorder Maternal Grandmother    Hypertension Maternal Grandfather    Hypertension Paternal Grandmother    Hypertension Paternal Grandfather    Asthma Daughter    Depression Daughter    Anxiety disorder Daughter    Irritable bowel syndrome Daughter    Lactose intolerance Daughter    Migraines Daughter    Asthma Son    Depression Son    Anxiety disorder Son    Irritable bowel syndrome Son    Lactose intolerance Son    Migraines Son    Asthma Son    Depression Son    Anxiety disorder Son    Irritable bowel syndrome Son    Lactose intolerance Son    Migraines Son    Asthma Niece    Other Niece        antiphospholipid AB syndrome   Colon cancer Neg Hx    Esophageal cancer Neg Hx    Rectal cancer Neg Hx    Stomach cancer Neg Hx     Review of Systems:    Constitutional: No weight loss, fever, chills, weakness or fatigue HEENT: Eyes: No change in vision               Ears, Nose, Throat:  No change in hearing or congestion Skin: No rash or itching Cardiovascular: No chest pain, chest pressure or palpitations   Respiratory: No SOB or cough Gastrointestinal: See HPI and otherwise negative Genitourinary: No dysuria or change in urinary frequency Neurological: No headache, dizziness or syncope Musculoskeletal: No new muscle or joint pain Hematologic: No bleeding or  bruising Psychiatric: No history of depression or anxiety    Physical Exam:  Vital signs: BP 130/82 (BP Location: Left Arm, Patient Position: Sitting)   Pulse 64   Ht 5' 7 (1.702 m) Comment: height measured without shoes  BMI 30.10 kg/m   Constitutional:   Pleasant  female appears to be in NAD, Well developed, Well nourished, alert and cooperative Eyes:   PEERL, EOMI. No icterus. Conjunctiva pink. Neck:  Supple Throat: Oral cavity and pharynx without inflammation, swelling or lesion.  Respiratory: Respirations even and unlabored. Lungs clear to auscultation bilaterally.   No wheezes, crackles, or rhonchi.  Cardiovascular: Normal S1, S2. Regular rate and rhythm. No peripheral edema, cyanosis or pallor.  Gastrointestinal:  Soft, nondistended, generalized abd tenderness. No rebound or guarding. Hypoactive bowel sounds. No appreciable masses or hepatomegaly. Rectal:  Not performed.  Msk:  Symmetrical without gross deformities. Without edema, no deformity or joint abnormality.  Neurologic:  Alert and  oriented x4;  grossly normal neurologically.  Skin:   Dry and intact without significant lesions or rashes.  RELEVANT LABS AND IMAGING: CBC    Latest Ref Rng & Units 05/22/2024    9:24 AM 11/26/2023    3:13 PM 09/10/2023   12:07 PM  CBC  WBC 3.4 - 10.8 x10E3/uL 3.7  4.4  4.1   Hemoglobin 11.1 - 15.9 g/dL 84.8  85.5  85.1   Hematocrit 34.0 - 46.6 % 45.8  43.5  45.2   Platelets 150 - 450 x10E3/uL 170  167  183      CMP  Latest Ref Rng & Units 10/13/2024   11:58 AM 05/22/2024    9:24 AM 11/26/2023    3:13 PM  CMP  Glucose 70 - 99 mg/dL 97  873  897   BUN 6 - 24 mg/dL 15  16  15    Creatinine 0.57 - 1.00 mg/dL 9.28  9.28  9.24   Sodium 134 - 144 mmol/L 143  146  143   Potassium 3.5 - 5.2 mmol/L 4.1  4.2  4.4   Chloride 96 - 106 mmol/L 101  101  104   CO2 20 - 29 mmol/L 26  25  24    Calcium 8.7 - 10.2 mg/dL 9.6  9.8  9.5   Total Protein 6.0 - 8.5 g/dL 6.2  6.7  6.4   Total  Bilirubin 0.0 - 1.2 mg/dL 0.9  0.9  0.7   Alkaline Phos 49 - 135 IU/L 78  94  80   AST 0 - 40 IU/L 27  26  19    ALT 0 - 32 IU/L 40  40  21      Lab Results  Component Value Date   TSH 1.250 10/13/2024  11/25/2023 EGD - Normal esophagus. - Normal stomach. - Normal examined duodenum. - No specimens collected.  09/25/2023 colonoscopy, 3 year recall with 2 day prep - Preparation of the colon was fair. - One 7 mm polyp in the cecum, removed with a cold snare. Resected and retrieved. - Non- bleeding internal hemorrhoids. 09/25/2023 EGD - LA Grade C esophagitis with no bleeding. Biopsied. - Gastric mucosal variant. Biopsied. - Normal examined duodenum. - Biopsies were taken with a cold forceps for Helicobacter pylori testing. Path: FINAL DIAGNOSIS       1. Surgical [P], gastric :      -  ANTRAL AND OXYNTIC MUCOSA WITH MILD CHEMICAL/REACTIVE CHANGE.      -  NO HELICOBACTER PYLORI ORGANISMS IDENTIFIED ON H&E STAINED SLIDE.       2. Surgical [P], altered gastric mucosa :      -  OXYNTIC MUCOSA WITH MILD CHRONIC INACTIVE GASTRITIS AND CHEMICAL/REACTIVE      CHANGE.      -  AN IMMUNOHISTOCHEMICAL STAIN FOR HELICOBACTER PYLORI ORGANISMS IS PENDING AND      WILL BE REPORTED IN AN ADDENDUM.       3. Surgical [P], esophagus :      -  SQUAMOUS MUCOSA WITH MILD BASAL CELL HYPERPLASIA SUGGESTIVE OF REFLUX.       4. Surgical [P], colon, cecum, polyp (1) :      -  TUBULAR ADENOMA, FRAGMENTS.   Assessment: Encounter Diagnoses  Name Primary?   Constipation, unspecified constipation type Yes   Abdominal pain, epigastric    Nausea without vomiting    Altered bowel habits    53 year old female patient that presents with worsening symptoms of epigastric pain, nausea, chronic constipation and reflux.  Patient reports all the symptoms seem to increase with starting new medication Wegovy .  Patient is currently on pantoprazole  twice daily.  EGD back in October 2024 showed grade C esophagitis with  follow-up endoscopy in December that showed mucosal healing.  Colonoscopy in/24 with poor bowel prep.  Recall 3 years will go ahead and order lab work as well as right upper quadrant abdominal ultrasound to rule out gallbladder disease.  Patient is also having worsening constipation despite being on Linzess  290 will go ahead and do bowel purge followed by samples of Ibsrela.  If no improvement [place] consider  promotiltiy agent Motegrity. Will check a abdominal x-ray two-view today to rule out obstruction.  Patient can use antiemetics as needed for nausea.   Plan: -CMP,CBC, lipase -Abdominal ultrasound RUQ   -abdominal xray 2 view -samples of IBsrela 50 mg BID -antiemetics prn nausea -ginger chews prn nausea -recall colonoscopy 09/2026  Thank you for the courtesy of this consult. Please call me with any questions or concerns.   Ambar Raphael, FNP-C [facility] 11/12/2024, 4:12 PM  Cc: [place] HERO, FNP

## 2024-11-12 NOTE — Patient Instructions (Addendum)
 Constipation Deanna May, NP recommends that you complete a bowel purge (to clean out your bowels). Please do the following: Purchase a bottle of Miralax over the counter as well as a box of 5 mg dulcolax tablets. Take 4 dulcolax tablets. Wait 1 hour. You will then drink 6-8 capfuls of Miralax mixed in an adequate amount of water /juice/gatorade (you may choose which of these liquids to drink) over the next 2-3 hours. You should expect results within 1 to 6 hours after completing the bowel purge.  Day #2 Start samples on ibsrela Take 1 capsule immediately before breakfast and dinner   Nausea Otc ginger chews  GERD  Continue pantoprazole  40mg  twice daily  If symptoms continue or worsen may consider stopping Wegovy   Your provider has requested that you go to the basement level for lab work before leaving today. Press B on the elevator. The lab is located at the first door on the left as you exit the elevator.   Your provider has requested that you have an abdominal x ray before leaving today. Please go to the basement floor to our Radiology department for the test.  You have been scheduled for an abdominal ultrasound at [facility] (1st floor of hospital) on 11/19/24 at 8:00am. Please arrive 30 minutes prior to your appointment for registration. Make certain not to have anything to eat or drink after midnight prior to your appointment. Should you need to reschedule your appointment, please contact radiology at [phone]. This test typically takes about 30 minutes to perform.  _______________________________________________________  If your blood pressure at your visit was 140/90 or greater, please contact your primary care physician to follow up on this.  _______________________________________________________  If you are age 12 or older, your body mass index should be between 23-30. Your Body mass index is 30.1 kg/m. If this is out of the aforementioned range listed,  please consider follow up with your Primary Care Provider.  If you are age 60 or younger, your body mass index should be between 19-25. Your Body mass index is 30.1 kg/m. If this is out of the aformentioned range listed, please consider follow up with your Primary Care Provider.   ________________________________________________________  The [facility] providers would like to encourage you to use MYCHART to communicate with providers for non-urgent requests or questions.  Due to long hold times on the telephone, sending your provider a message by [facility] may be a faster and more efficient way to get a response.  Please allow 48 business hours for a response.  Please remember that this is for non-urgent requests.  _______________________________________________________  [facility] is using a team-based approach to care.  Your team is made up of your doctor and two to three APPS. Our APPS (Nurse Practitioners and Physician Assistants) work with your physician to ensure care continuity for you. They are fully qualified to address your health concerns and develop a treatment plan. They communicate directly with your gastroenterologist to care for you. Seeing the Advanced Practice Practitioners on your physician's team can help you by facilitating care more promptly, often allowing for earlier appointments, access to diagnostic testing, procedures, and other specialty referrals.   Thank you for trusting me with your gastrointestinal care. Deanna May, FNP-C

## 2024-11-18 ENCOUNTER — Emergency Department (HOSPITAL_COMMUNITY)

## 2024-11-18 ENCOUNTER — Encounter: Payer: Self-pay | Admitting: Family Medicine

## 2024-11-18 ENCOUNTER — Other Ambulatory Visit: Payer: Self-pay

## 2024-11-18 ENCOUNTER — Emergency Department (HOSPITAL_COMMUNITY)
Admission: EM | Admit: 2024-11-18 | Discharge: 2024-11-18 | Disposition: A | Discharge status: Home / Self Care | Attending: Emergency Medicine | Admitting: Emergency Medicine

## 2024-11-18 DIAGNOSIS — Z7951 Long term (current) use of inhaled steroids: Secondary | ICD-10-CM | POA: Insufficient documentation

## 2024-11-18 DIAGNOSIS — R0602 Shortness of breath: Secondary | ICD-10-CM | POA: Diagnosis not present

## 2024-11-18 DIAGNOSIS — J45909 Unspecified asthma, uncomplicated: Secondary | ICD-10-CM | POA: Insufficient documentation

## 2024-11-18 DIAGNOSIS — R059 Cough, unspecified: Secondary | ICD-10-CM | POA: Diagnosis not present

## 2024-11-18 DIAGNOSIS — R1013 Epigastric pain: Secondary | ICD-10-CM | POA: Insufficient documentation

## 2024-11-18 DIAGNOSIS — R748 Abnormal levels of other serum enzymes: Secondary | ICD-10-CM

## 2024-11-18 DIAGNOSIS — Z91040 Latex allergy status: Secondary | ICD-10-CM | POA: Diagnosis not present

## 2024-11-18 DIAGNOSIS — R945 Abnormal results of liver function studies: Secondary | ICD-10-CM | POA: Insufficient documentation

## 2024-11-18 DIAGNOSIS — Z7982 Long term (current) use of aspirin: Secondary | ICD-10-CM | POA: Diagnosis not present

## 2024-11-18 DIAGNOSIS — Z794 Long term (current) use of insulin: Secondary | ICD-10-CM | POA: Insufficient documentation

## 2024-11-18 DIAGNOSIS — R110 Nausea: Secondary | ICD-10-CM | POA: Insufficient documentation

## 2024-11-18 DIAGNOSIS — R61 Generalized hyperhidrosis: Secondary | ICD-10-CM | POA: Insufficient documentation

## 2024-11-18 LAB — HEPATIC FUNCTION PANEL
ALT: 147 U/L — ABNORMAL HIGH (ref 0–44)
AST: 248 U/L — ABNORMAL HIGH (ref 15–41)
Albumin: 4.2 g/dL (ref 3.5–5.0)
Alkaline Phosphatase: 79 U/L (ref 38–126)
Bilirubin, Direct: 0.6 mg/dL — ABNORMAL HIGH (ref 0.0–0.2)
Indirect Bilirubin: 0.5 mg/dL (ref 0.3–0.9)
Total Bilirubin: 1 mg/dL (ref 0.0–1.2)
Total Protein: 6 g/dL — ABNORMAL LOW (ref 6.5–8.1)

## 2024-11-18 LAB — BASIC METABOLIC PANEL WITH GFR
Anion gap: 14 (ref 5–15)
BUN: 16 mg/dL (ref 6–20)
CO2: 22 mmol/L (ref 22–32)
Calcium: 8.9 mg/dL (ref 8.9–10.3)
Chloride: 106 mmol/L (ref 98–111)
Creatinine, Ser: 0.58 mg/dL (ref 0.44–1.00)
GFR, Estimated: >60 mL/min (ref >60)
Glucose, Bld: 120 mg/dL — ABNORMAL HIGH (ref 70–99)
Potassium: 3.3 mmol/L — ABNORMAL LOW (ref 3.5–5.1)
Sodium: 142 mmol/L (ref 135–145)

## 2024-11-18 LAB — CBC
HCT: 38.2 % (ref 36.0–46.0)
Hemoglobin: 13.2 g/dL (ref 12.0–15.0)
MCH: 31 pg (ref 26.0–34.0)
MCHC: 34.6 g/dL (ref 30.0–36.0)
MCV: 89.7 fL (ref 80.0–100.0)
Platelets: 151 K/uL (ref 150–400)
RBC: 4.26 MIL/uL (ref 3.87–5.11)
RDW: 12.2 % (ref 11.5–15.5)
WBC: 7.3 K/uL (ref 4.0–10.5)
nRBC: 0 % (ref 0.0–0.2)

## 2024-11-18 LAB — LIPASE, BLOOD: Lipase: 33 U/L (ref 11–51)

## 2024-11-18 LAB — TROPONIN T, HIGH SENSITIVITY
Troponin T High Sensitivity: <15 ng/L (ref 0–19)
Troponin T High Sensitivity: <15 ng/L (ref 0–19)

## 2024-11-18 MED ORDER — FENTANYL CITRATE (PF) 100 MCG/2ML IJ SOLN
50.0000 ug | Freq: Once | INTRAMUSCULAR | Status: AC
  Administered 2024-11-18: 50 ug via INTRAVENOUS
  Filled 2024-11-18: qty 2

## 2024-11-18 NOTE — ED Provider Notes (Signed)
 [place] EMERGENCY DEPARTMENT AT [facility] Provider Note   CSN: [id number] Arrival date & time: 11/18/24  9690     Patient presents with: Chest Pain   Olivia Zamora is a 53 y.o. female.   The patient is a retired engineer, civil (consulting) who presents with chest discomfort that began approximately three hours prior to arrival. The discomfort is described as a sensation of pressure, akin to someone sitting on the chest, and radiates from the anterior chest to the back at the bra line. The pain is severe and persistent, rated as terrible, and is associated with nausea, for which the patient took Zofran . The patient also reports back pain, cold sweats, and a sensation of tightness in the chest exacerbated by breathing, though not accompanied by increased respiratory rate. The patient denies fever but reports a mild cough. The patient has a history of angina, asthma, and gastritis, and is currently on Wegovy  for insulin resistance, Synthroid  for thyroid  issues, and LINZESS . The patient has not experienced similar symptoms before and is scheduled for a gallbladder ultrasound. The patient denies smoking and has a history of significant weight loss and previous fitness training. History was obtained from the patient.     Chest Pain      Prior to Admission medications   Medication Sig Start Date End Date Taking? Authorizing Provider  acetaminophen  (TYLENOL ) 500 MG tablet Take 500 mg by mouth every 8 (eight) hours as needed for moderate pain (pain score 4-6).    [provider]  aspirin  EC 81 MG tablet Take 81 mg by mouth daily.    [provider]  Cyanocobalamin 5000 MCG/ML LIQD Place under the tongue.    [provider]  docusate sodium  (COLACE) 100 MG capsule Take 1 capsule (100 mg total) by mouth daily. Patient taking differently: Take 100 mg by mouth 2 (two) times daily. 06/20/22   Merlynn Niki FALCON, FNP  Fremanezumab -vfrm (AJOVY ) 225 MG/1.5ML SOAJ Inject 225 mg into  the skin every 30 (thirty) days. 06/23/24   Penumalli, Vikram R, MD  furosemide (LASIX) 20 MG tablet Take 20 mg by mouth daily. 03/12/24   [provider]  levalbuterol  (XOPENEX  HFA) 45 MCG/ACT inhaler Inhale 2 puffs into the lungs every 4 (four) hours as needed for wheezing or shortness of breath (coughing fits). 11/04/23   Zollie Lowers, MD  levalbuterol  (XOPENEX ) 0.63 MG/3ML nebulizer solution TAKE 3 MLS BY NEBULIZATION EVERY 4 HOURS AS NEEDED FOR WHEEZING, SHORTNESS OF BREATH OR COUGH FITS 11/11/23   Luke Orlan HERO, DO  linaclotide  (LINZESS ) 290 MCG CAPS capsule Take 1 capsule (290 mcg total) by mouth daily before breakfast. 10/13/24   Rakes, Rock HERO, FNP  losartan  (COZAAR ) 50 MG tablet Take 1 tablet (50 mg total) by mouth daily. 07/06/24   Severa Rock HERO, FNP  Multiple Vitamins-Minerals (MULTIVITAMIN ADULT PO) Take 1 tablet by mouth daily.    [provider]  ondansetron  (ZOFRAN ) 4 MG tablet Take 1 tablet (4 mg total) by mouth every 8 (eight) hours as needed for nausea or vomiting. 10/13/24   Severa Rock HERO, FNP  pantoprazole  (PROTONIX ) 40 MG tablet Take 1 tablet (40 mg total) by mouth 2 (two) times daily. 09/25/23   Federico Rosario BROCKS, MD  Rimegepant Sulfate (NURTEC) 75 MG TBDP Take 1 tablet (75 mg total) by mouth as needed (migraine). 06/23/24   Penumalli, Eduard SAUNDERS, MD  semaglutide -weight management (WEGOVY ) 1.7 MG/0.75ML SOAJ SQ injection Inject 1.7 mg into the skin once a week for  4 doses. 11/01/24 11/23/24  Severa Rock HERO, FNP  semaglutide -weight management (WEGOVY ) 2.4 MG/0.75ML SOAJ SQ injection Inject 2.4 mg into the skin once a week for 4 doses. 11/30/24 12/22/24  Severa Rock HERO, FNP  SYNTHROID  100 MCG tablet Take 100 mcg by mouth daily at 6 (six) AM. 12/11/20   [provider]  Tiotropium Bromide Monohydrate  (SPIRIVA  RESPIMAT) 1.25 MCG/ACT AERS INHALE 2 PUFFS INTO THE LUNGS ONCE DAILY 11/22/23   Rakes, Rock HERO, FNP  Vitamin D , Ergocalciferol , (DRISDOL) 1.25 MG (50000 UT) CAPS  capsule Take 50,000 Units by mouth every 7 (seven) days.    [provider]    Allergies: Iodine , Iohexol , Latex, Topamax [topiramate], Zanaflex  [tizanidine ], and Prednisone     Review of Systems  Cardiovascular:  Positive for chest pain.    Updated Vital Signs BP 120/74   Pulse 73   Temp 97.9 F (36.6 C) (Oral)   Resp 16   Ht 5' 7 (1.702 m)   Wt 84.4 kg   SpO2 99%   BMI 29.13 kg/m   Physical Exam Vitals and nursing note reviewed.  Constitutional:      Appearance: She is well-developed.  HENT:     Head: Normocephalic and atraumatic.  Cardiovascular:     Rate and Rhythm: Normal rate and regular rhythm.  Pulmonary:     Effort: No respiratory distress.     Breath sounds: No stridor.  Abdominal:     General: There is no distension.     Comments: Epigastric ttp  Musculoskeletal:     Cervical back: Normal range of motion.  Neurological:     Mental Status: She is alert.     (all labs ordered are listed, but only abnormal results are displayed) Labs Reviewed  BASIC METABOLIC PANEL WITH GFR - Abnormal; Notable for the following components:      Result Value   Potassium 3.3 (*)    Glucose, Bld 120 (*)    All other components within normal limits  HEPATIC FUNCTION PANEL - Abnormal; Notable for the following components:   Total Protein 6.0 (*)    AST 248 (*)    ALT 147 (*)    Bilirubin, Direct 0.6 (*)    All other components within normal limits  CBC  LIPASE, BLOOD  TROPONIN T, HIGH SENSITIVITY  TROPONIN T, HIGH SENSITIVITY    EKG: None  Radiology: CT Renal Stone Study Result Date: 11/18/2024 EXAM: CT UROGRAM 11/18/2024 05:21:54 AM TECHNIQUE: CT of the abdomen and pelvis was performed without the administration of intravenous contrast as per CT urogram protocol. Multiplanar reformatted images as well as MIP urogram images are provided for review. Automated exposure control, iterative reconstruction, and/or weight based adjustment of the mA/kV was  utilized to reduce the radiation dose to as low as reasonably achievable. COMPARISON: None available. CLINICAL HISTORY: Abdominal/flank pain, stone suspected. FINDINGS: LOWER CHEST: No acute abnormality. LIVER: The liver is unremarkable. GALLBLADDER AND BILE DUCTS: Gallbladder is unremarkable. No biliary ductal dilatation. SPLEEN: No acute abnormality. PANCREAS: No acute abnormality. ADRENAL GLANDS: No acute abnormality. KIDNEYS, URETERS AND BLADDER: No stones in the kidneys or ureters. No hydronephrosis. No perinephric or periureteral stranding. Urinary bladder is unremarkable. GI AND BOWEL: Stomach demonstrates no acute abnormality. There is no bowel obstruction. PERITONEUM AND RETROPERITONEUM: No ascites. No free air. VASCULATURE: Aorta is normal in caliber. LYMPH NODES: No lymphadenopathy. REPRODUCTIVE ORGANS: The patient is status post hysterectomy and bilateral salpingo-oophorectomy. BONES AND SOFT TISSUES: No acute osseous abnormality. No focal soft tissue  abnormality. IMPRESSION: 1. No nephrolithiasis, obstructive uropathy, or urothelial mass. Electronically signed by: Evalene Coho MD 11/18/2024 05:26 AM EST RP Workstation: [id number]   DG Chest 2 View Result Date: 11/18/2024 EXAM: 2 VIEW(S) XRAY OF THE CHEST 11/18/2024 03:50:00 AM COMPARISON: 06/23/2023 CLINICAL HISTORY: chest pain FINDINGS: LUNGS AND PLEURA: No focal pulmonary opacity. No pleural effusion. No pneumothorax. HEART AND MEDIASTINUM: No acute abnormality of the cardiac and mediastinal silhouettes. BONES AND SOFT TISSUES: No acute osseous abnormality. IMPRESSION: 1. No acute cardiopulmonary pathology. Electronically signed by: Pinkie Pebbles MD 11/18/2024 03:52 AM EST RP Workstation: [id number]     Procedures   Medications Ordered in the ED  fentaNYL  (SUBLIMAZE ) injection 50 mcg (50 mcg Intravenous Given 11/18/24 0353)    Clinical Course as of 11/18/24 0704  Wed Nov 18, 2024  0351 Initial Evaluation:  Patient presents  with three-hour history of chest discomfort, pressure, and tightness, with nausea, cold sweats, and shortness of breath.  The differential diagnosis for this presentation includes acute coronary syndrome (ACS), pulmonary embolism, aortic dissection, acute cholecystitis, and pancreatitis. Less likely considerations include musculoskeletal pain and gastrointestinal issues such as GERD or esophageal spasm. The patient's history of angina and the nature of the chest pain raise concern for a cardiac etiology, though the absence of radiation and the presence of gastrointestinal symptoms also suggest a possible gallbladder or gastric issue.  Plan:  Conduct EKG, CBC, CMP, lipase and chest X-ray. Monitor patient in emergency department. Potential small dose fentanyl  for pain control. [JM]    Clinical Course User Index [JM] Khamia Stambaugh, Selinda, MD                                 Medical Decision Making Amount and/or Complexity of Data Reviewed Labs: ordered. Radiology: ordered.  Risk Prescription drug management.   Patient's EKG and troponins were reassuring I doubt ACS.  Her symptoms are totally resolved with mild LFT elevations.  She denies any alcohol but does state that she has fatty liver.  She will follow-up with her doctor to see if this might be related to fatty liver or her Wegovy  use.  The rest of her workup was reassuring.  She is pain-free for couple hours without any return.  Vital signs stable.  Stable for discharge with GI/PCP follow-up.  Return here for any new or worsening symptoms.  Final diagnoses:  Elevated liver enzymes  Epigastric pain    ED Discharge Orders     None          Tameaka Eichhorn, Selinda, MD 11/18/24 [phone]

## 2024-11-18 NOTE — ED Triage Notes (Signed)
 Pt arrived [facility] from home with c/o chest pain  that has pressure at the bottom with stabbing pain in the middle and radiating to left shoulder. Pt stating she took tylenol , Tums, and protonix , and gas pills and did not help. Pt took four 81mg  ASA at home. [facility] gave 2 sprays of nitroglycerin with no help. 20G in RAC. Pain began at midnight.

## 2024-11-19 ENCOUNTER — Ambulatory Visit: Admitting: Family Medicine

## 2024-11-19 ENCOUNTER — Ambulatory Visit (HOSPITAL_COMMUNITY): Admission: RE | Admit: 2024-11-19 | Discharge: 2024-11-19 | Attending: Gastroenterology

## 2024-11-19 DIAGNOSIS — R194 Change in bowel habit: Secondary | ICD-10-CM | POA: Diagnosis present

## 2024-11-19 DIAGNOSIS — R1013 Epigastric pain: Secondary | ICD-10-CM | POA: Diagnosis present

## 2024-11-19 DIAGNOSIS — K5900 Constipation, unspecified: Secondary | ICD-10-CM | POA: Insufficient documentation

## 2024-11-19 DIAGNOSIS — R110 Nausea: Secondary | ICD-10-CM | POA: Diagnosis present

## 2024-11-23 ENCOUNTER — Telehealth: Payer: Self-pay | Admitting: Gastroenterology

## 2024-11-23 NOTE — Telephone Encounter (Signed)
 Inbound call from patient stating that she is in severe pain and has not been able to get out of bed today. Patient stated that she did every thing she was recommended to do before the Ultrasound. Patient had to go to the ER by ambulance due to her pain. Patient is requesting a call back.Please advise.

## 2024-11-24 ENCOUNTER — Other Ambulatory Visit: Payer: Self-pay

## 2024-11-24 DIAGNOSIS — K8020 Calculus of gallbladder without cholecystitis without obstruction: Secondary | ICD-10-CM

## 2024-11-24 DIAGNOSIS — K838 Other specified diseases of biliary tract: Secondary | ICD-10-CM

## 2024-11-24 DIAGNOSIS — R110 Nausea: Secondary | ICD-10-CM

## 2024-11-24 DIAGNOSIS — R1013 Epigastric pain: Secondary | ICD-10-CM

## 2024-11-24 NOTE — Telephone Encounter (Signed)
 Addressed.

## 2024-11-25 ENCOUNTER — Other Ambulatory Visit: Payer: Self-pay | Admitting: Family Medicine

## 2024-11-25 ENCOUNTER — Ambulatory Visit (HOSPITAL_COMMUNITY): Admission: RE | Admit: 2024-11-25 | Discharge: 2024-11-25 | Attending: Gastroenterology

## 2024-11-25 ENCOUNTER — Other Ambulatory Visit

## 2024-11-25 ENCOUNTER — Other Ambulatory Visit: Payer: Self-pay | Admitting: Gastroenterology

## 2024-11-25 ENCOUNTER — Encounter: Payer: Self-pay | Admitting: Family Medicine

## 2024-11-25 DIAGNOSIS — K838 Other specified diseases of biliary tract: Secondary | ICD-10-CM

## 2024-11-25 DIAGNOSIS — Z6830 Body mass index (BMI) 30.0-30.9, adult: Secondary | ICD-10-CM

## 2024-11-25 DIAGNOSIS — R110 Nausea: Secondary | ICD-10-CM

## 2024-11-25 DIAGNOSIS — R1013 Epigastric pain: Secondary | ICD-10-CM | POA: Insufficient documentation

## 2024-11-25 DIAGNOSIS — E782 Mixed hyperlipidemia: Secondary | ICD-10-CM

## 2024-11-25 DIAGNOSIS — K8020 Calculus of gallbladder without cholecystitis without obstruction: Secondary | ICD-10-CM

## 2024-11-25 DIAGNOSIS — I10 Essential (primary) hypertension: Secondary | ICD-10-CM

## 2024-11-25 DIAGNOSIS — Z8673 Personal history of transient ischemic attack (TIA), and cerebral infarction without residual deficits: Secondary | ICD-10-CM

## 2024-11-25 LAB — HEPATIC FUNCTION PANEL
ALT: 238 U/L — ABNORMAL HIGH (ref 3–35)
AST: 38 U/L — ABNORMAL HIGH (ref 5–37)
Albumin: 4.3 g/dL (ref 3.5–5.2)
Alkaline Phosphatase: 100 U/L (ref 39–117)
Bilirubin, Direct: 0.2 mg/dL (ref 0.1–0.3)
Total Bilirubin: 0.7 mg/dL (ref 0.2–1.2)
Total Protein: 6.5 g/dL (ref 6.0–8.3)

## 2024-11-25 LAB — CBC WITH DIFFERENTIAL/PLATELET
Basophils Absolute: 0 K/uL (ref 0.0–0.1)
Basophils Relative: 0.2 % (ref 0.0–3.0)
Eosinophils Absolute: 0.1 K/uL (ref 0.0–0.7)
Eosinophils Relative: 2.2 % (ref 0.0–5.0)
HCT: 42.2 % (ref 36.0–46.0)
Hemoglobin: 14.3 g/dL (ref 12.0–15.0)
Lymphocytes Relative: 26.6 % (ref 12.0–46.0)
Lymphs Abs: 1.2 K/uL (ref 0.7–4.0)
MCHC: 34 g/dL (ref 30.0–36.0)
MCV: 90.8 fl (ref 78.0–100.0)
Monocytes Absolute: 0.4 K/uL (ref 0.1–1.0)
Monocytes Relative: 7.9 % (ref 3.0–12.0)
Neutro Abs: 2.9 K/uL (ref 1.4–7.7)
Neutrophils Relative %: 63.1 % (ref 43.0–77.0)
Platelets: 159 K/uL (ref 150.0–400.0)
RBC: 4.65 Mil/uL (ref 3.87–5.11)
RDW: 12.9 % (ref 11.5–15.5)
WBC: 4.6 K/uL (ref 4.0–10.5)

## 2024-11-25 MED ORDER — GADOBUTROL 1 MMOL/ML IV SOLN
8.0000 mL | Freq: Once | INTRAVENOUS | Status: AC | PRN
  Administered 2024-11-25: 08:00:00 8 mL via INTRAVENOUS

## 2024-11-25 MED ORDER — ZEPBOUND 2.5 MG/0.5ML ~~LOC~~ SOAJ: 2.5000 mg | SUBCUTANEOUS | 3 refills | Status: AC

## 2024-11-25 NOTE — Telephone Encounter (Signed)
 WEGOVY  0.25 MG/0.5ML SOAJ SQ injection        Changed from: tirzepatide  (ZEPBOUND ) 2.5 MG/0.5ML Pen    Pharmacy comment: Alternative Requested:THE PRESCRIBED MEDICATION IS NOT COVERED BY INSURANCE. PLEASE CONSIDER CHANGING TO ONE OF THE SUGGESTED COVERED ALTERNATIVES

## 2024-11-26 ENCOUNTER — Encounter: Payer: Self-pay | Admitting: Family Medicine

## 2024-11-26 ENCOUNTER — Telehealth: Payer: Self-pay | Admitting: Family Medicine

## 2024-11-26 NOTE — Telephone Encounter (Unsigned)
 Copied from CRM #[id number]. Topic: Appointments - Appointment Scheduling >> Nov 26, 2024  2:07 PM Delon DASEN wrote: Patient is asking if she can get scheduled for mammo at hospital if possible before the end of the year- [phone]

## 2024-11-27 ENCOUNTER — Other Ambulatory Visit: Payer: Self-pay

## 2024-11-27 ENCOUNTER — Ambulatory Visit: Payer: Self-pay | Admitting: Gastroenterology

## 2024-11-27 DIAGNOSIS — Z1231 Encounter for screening mammogram for malignant neoplasm of breast: Secondary | ICD-10-CM

## 2024-11-27 NOTE — Telephone Encounter (Signed)
 Scheduled appt with Zelda Salmon 12/31 at 3pm sent notification to patient through [facility] and called pt LMTCB

## 2024-11-30 ENCOUNTER — Other Ambulatory Visit (HOSPITAL_COMMUNITY): Payer: Self-pay

## 2024-11-30 ENCOUNTER — Telehealth: Payer: Self-pay | Admitting: Pharmacist

## 2024-11-30 ENCOUNTER — Telehealth: Payer: Self-pay

## 2024-11-30 NOTE — Telephone Encounter (Signed)
 Can you please completed Zepbound  PA She is having significant GI side effects from the Wegovy  and she called her insurance and they said they would pay for the switch.  Routing from PCP to PA team

## 2024-11-30 NOTE — Telephone Encounter (Signed)
 Pharmacy Patient Advocate Encounter   Received notification from Physician's Office that prior authorization for Zepbound  2.5MG /0.5ML pen-injectors is required/requested.   Insurance verification completed.   The patient is insured through [facility].   Per test claim: PA required; PA submitted to above mentioned insurance via Latent Key/confirmation #/EOC [id number] Status is pending

## 2024-12-01 ENCOUNTER — Other Ambulatory Visit (HOSPITAL_COMMUNITY): Payer: Self-pay

## 2024-12-02 ENCOUNTER — Other Ambulatory Visit (HOSPITAL_COMMUNITY): Payer: Self-pay

## 2024-12-02 ENCOUNTER — Encounter (HOSPITAL_COMMUNITY): Payer: Self-pay | Admitting: General Surgery

## 2024-12-02 ENCOUNTER — Other Ambulatory Visit: Payer: Self-pay

## 2024-12-02 NOTE — Progress Notes (Addendum)
 SDW CALL  Patient was given pre-op instructions over the phone. The opportunity was given for the patient to ask questions. No further questions asked. Patient verbalized understanding of instructions given.   PCP - Severa Rock HERO, FNP  Cardiologist - denies  PPM/ICD - denies   Chest x-ray - 11/18/24 EKG - 11/18/24 Stress Test - denies ECHO - denies Cardiac Cath - denies  Sleep Study - doesn't use CPAP, but has it   Fasting Blood Sugar - N/A  Blood Thinner Instructions: N/A Aspirin  Instructions:N/A  ERAS Protcol - ERAS per protocol   COVID TEST-  N/A   Anesthesia review: yes- review labs, recent ED visit- pt denies history of chest pain although Angina is listed on the chart as medical history. Pt denies any cardiac workup in the past of cardiologist. Pt with history of having a seizure about 10 years ago- she reports that this was stress related and only happened once. She denies having neurologist.   Patient denies shortness of breath, fever, cough and chest pain over the phone call    Surgical Instructions    Your procedure is scheduled on December 08, 2024  Report to [facility] Main Entrance A at 1:00 P.M., then check in with the Admitting office.  Call this number if you have problems the morning of surgery:  [phone]    Remember:  Do not eat after midnight the night before your surgery  You may drink clear liquids until 12:30PM the morning of your surgery.   Clear liquids allowed are: Water , Non-Citrus Juices (without pulp), Carbonated Beverages, Clear Tea, Black Coffee ONLY (NO MILK, no honey, CREAM OR POWDERED CREAMER of any kind), and Gatorade   Take these medicines the morning of surgery with A SIP OF WATER :  Protonix , synthroid , spiriva  PRN: xopenex , zofran , nurtec  DO NOT TAKE Zepbound  for 7 days prior to surgery.    As of today, STOP taking any Aspirin  (unless otherwise instructed by your surgeon) Aleve, Naproxen, Ibuprofen, Motrin, Advil,  Goody's, BC's, all herbal medications, fish oil, and all vitamins.  [facility] is not responsible for any belongings or valuables.  If you use a CPAP at night, you may bring your mask for your overnight stay.   Contacts, glasses, hearing aids, dentures or partials may not be worn into surgery, please bring cases for these belongings   Patients discharged the day of surgery will not be allowed to drive home, and someone needs to stay with them for 24 hours.   SURGICAL WAITING ROOM VISITATION You may have 2 visitor in the pre-op area at a time determined by the pre-op nurse. (Visitor may not switch out) Patients having surgery or a procedure in a hospital may have two support people in the waiting room. Children under the age of 34 must have an adult with them who is not the patient.    Day of Surgery:  Take a shower the day of or night before with antibacterial soap. Wear Clean/Comfortable clothing the morning of surgery Do not apply any deodorants/lotions.   Do not wear jewelry or makeup Do not wear lotions, powders, perfumes/colognes, or deodorant. Do not shave 48 hours prior to surgery. Do not bring valuables to the hospital. Do not wear nail polish, gel polish, artificial nails, or any other type of covering on natural nails (fingers and toes)  Remember to brush your teeth WITH YOUR REGULAR TOOTHPASTE.

## 2024-12-03 ENCOUNTER — Ambulatory Visit: Payer: Self-pay | Admitting: General Surgery

## 2024-12-03 DIAGNOSIS — K8050 Calculus of bile duct without cholangitis or cholecystitis without obstruction: Secondary | ICD-10-CM

## 2024-12-04 ENCOUNTER — Other Ambulatory Visit: Payer: Self-pay | Admitting: Family Medicine

## 2024-12-04 ENCOUNTER — Other Ambulatory Visit (HOSPITAL_COMMUNITY): Payer: Self-pay

## 2024-12-07 ENCOUNTER — Other Ambulatory Visit (HOSPITAL_COMMUNITY): Payer: Self-pay

## 2024-12-07 NOTE — Telephone Encounter (Signed)
 Pharmacy Patient Advocate Encounter  Received notification from [facility] [employer] that Prior Authorization for Zepbound  2.5MG /0.5ML pen-injectors  has been APPROVED from 11/30/2024 to 07/31/2025   PA #/Case ID/Reference #: [id number]  IF APPROVED AND 18+, MEMBER ACTION REQ FOR [employer] IN [employer] [facility] MEMBER CALL [phone]

## 2024-12-08 ENCOUNTER — Ambulatory Visit (HOSPITAL_COMMUNITY): Payer: Self-pay | Admitting: Physician Assistant

## 2024-12-08 ENCOUNTER — Ambulatory Visit (HOSPITAL_COMMUNITY)

## 2024-12-08 ENCOUNTER — Observation Stay (HOSPITAL_COMMUNITY)
Admission: RE | Admit: 2024-12-08 | Discharge: 2024-12-09 | Disposition: A | Discharge status: Home / Self Care | Source: Home / Self Care | Attending: General Surgery | Admitting: General Surgery

## 2024-12-08 ENCOUNTER — Encounter (HOSPITAL_COMMUNITY)
Admission: RE | Disposition: A | Discharge status: Home / Self Care | Payer: Self-pay | Source: Home / Self Care | Attending: General Surgery

## 2024-12-08 DIAGNOSIS — E039 Hypothyroidism, unspecified: Secondary | ICD-10-CM | POA: Insufficient documentation

## 2024-12-08 DIAGNOSIS — R1011 Right upper quadrant pain: Secondary | ICD-10-CM | POA: Diagnosis present

## 2024-12-08 DIAGNOSIS — Z91040 Latex allergy status: Secondary | ICD-10-CM | POA: Insufficient documentation

## 2024-12-08 DIAGNOSIS — J45909 Unspecified asthma, uncomplicated: Secondary | ICD-10-CM | POA: Diagnosis not present

## 2024-12-08 DIAGNOSIS — Z87891 Personal history of nicotine dependence: Secondary | ICD-10-CM | POA: Diagnosis not present

## 2024-12-08 DIAGNOSIS — K8010 Calculus of gallbladder with chronic cholecystitis without obstruction: Principal | ICD-10-CM | POA: Insufficient documentation

## 2024-12-08 DIAGNOSIS — I10 Essential (primary) hypertension: Secondary | ICD-10-CM | POA: Diagnosis not present

## 2024-12-08 DIAGNOSIS — K8050 Calculus of bile duct without cholangitis or cholecystitis without obstruction: Principal | ICD-10-CM | POA: Diagnosis present

## 2024-12-08 DIAGNOSIS — Z96652 Presence of left artificial knee joint: Secondary | ICD-10-CM | POA: Insufficient documentation

## 2024-12-08 DIAGNOSIS — Z79899 Other long term (current) drug therapy: Secondary | ICD-10-CM | POA: Insufficient documentation

## 2024-12-08 HISTORY — PX: CHOLECYSTECTOMY: SHX55

## 2024-12-08 LAB — CBC WITH DIFFERENTIAL/PLATELET
Abs Immature Granulocytes: 0 K/uL (ref 0.00–0.07)
Basophils Absolute: 0 K/uL (ref 0.0–0.1)
Basophils Relative: 0 %
Eosinophils Absolute: 0.1 K/uL (ref 0.0–0.5)
Eosinophils Relative: 3 %
HCT: 42.9 % (ref 36.0–46.0)
Hemoglobin: 14.4 g/dL (ref 12.0–15.0)
Immature Granulocytes: 0 %
Lymphocytes Relative: 32 %
Lymphs Abs: 1 K/uL (ref 0.7–4.0)
MCH: 31.4 pg (ref 26.0–34.0)
MCHC: 33.6 g/dL (ref 30.0–36.0)
MCV: 93.7 fL (ref 80.0–100.0)
Monocytes Absolute: 0.3 K/uL (ref 0.1–1.0)
Monocytes Relative: 8 %
Neutro Abs: 1.9 K/uL (ref 1.7–7.7)
Neutrophils Relative %: 57 %
Platelets: 154 K/uL (ref 150–400)
RBC: 4.58 MIL/uL (ref 3.87–5.11)
RDW: 12.9 % (ref 11.5–15.5)
WBC: 3.3 K/uL — ABNORMAL LOW (ref 4.0–10.5)
nRBC: 0 % (ref 0.0–0.2)

## 2024-12-08 LAB — HEPATIC FUNCTION PANEL
ALT: 877 U/L — ABNORMAL HIGH (ref 0–44)
AST: 353 U/L — ABNORMAL HIGH (ref 15–41)
Albumin: 4.1 g/dL (ref 3.5–5.0)
Alkaline Phosphatase: 174 U/L — ABNORMAL HIGH (ref 38–126)
Bilirubin, Direct: 0.5 mg/dL — ABNORMAL HIGH (ref 0.0–0.2)
Indirect Bilirubin: 0.5 mg/dL (ref 0.3–0.9)
Total Bilirubin: 1 mg/dL (ref 0.0–1.2)
Total Protein: 6.1 g/dL — ABNORMAL LOW (ref 6.5–8.1)

## 2024-12-08 LAB — COMPREHENSIVE METABOLIC PANEL WITH GFR
ALT: 953 U/L — ABNORMAL HIGH (ref 0–44)
AST: 410 U/L — ABNORMAL HIGH (ref 15–41)
Albumin: 4 g/dL (ref 3.5–5.0)
Alkaline Phosphatase: 181 U/L — ABNORMAL HIGH (ref 38–126)
Anion gap: 11 (ref 5–15)
BUN: 12 mg/dL (ref 6–20)
CO2: 22 mmol/L (ref 22–32)
Calcium: 9 mg/dL (ref 8.9–10.3)
Chloride: 106 mmol/L (ref 98–111)
Creatinine, Ser: 0.59 mg/dL (ref 0.44–1.00)
GFR, Estimated: >60 mL/min
Glucose, Bld: 112 mg/dL — ABNORMAL HIGH (ref 70–99)
Potassium: 4.1 mmol/L (ref 3.5–5.1)
Sodium: 140 mmol/L (ref 135–145)
Total Bilirubin: 1 mg/dL (ref 0.0–1.2)
Total Protein: 6.3 g/dL — ABNORMAL LOW (ref 6.5–8.1)

## 2024-12-08 SURGERY — LAPAROSCOPIC CHOLECYSTECTOMY WITH INTRAOPERATIVE CHOLANGIOGRAM
Anesthesia: General | Site: Abdomen

## 2024-12-08 MED ORDER — ONDANSETRON HCL 4 MG/2ML IJ SOLN
4.0000 mg | Freq: Four times a day (QID) | INTRAMUSCULAR | Status: DC | PRN
  Administered 2024-12-09: 4 mg via INTRAVENOUS
  Filled 2024-12-08: qty 2

## 2024-12-08 MED ORDER — GABAPENTIN 300 MG PO CAPS: 300.0000 mg | ORAL_CAPSULE | ORAL | Status: AC

## 2024-12-08 MED ORDER — OXYCODONE HCL 5 MG PO TABS
5.0000 mg | ORAL_TABLET | ORAL | Status: DC | PRN
  Administered 2024-12-08 – 2024-12-09 (×2): 10 mg via ORAL
  Filled 2024-12-08 (×2): qty 2

## 2024-12-08 MED ORDER — ACETAMINOPHEN 10 MG/ML IV SOLN: 1000.0000 mg | Freq: Once | INTRAVENOUS | Status: DC | PRN

## 2024-12-08 MED ORDER — PANTOPRAZOLE SODIUM 40 MG PO TBEC
40.0000 mg | DELAYED_RELEASE_TABLET | Freq: Two times a day (BID) | ORAL | Status: DC
  Administered 2024-12-08 – 2024-12-09 (×2): 40 mg via ORAL
  Filled 2024-12-08 (×2): qty 1

## 2024-12-08 MED ORDER — ACETAMINOPHEN 500 MG PO TABS
ORAL_TABLET | ORAL | Status: AC
  Administered 2024-12-08: 1000 mg via ORAL
  Filled 2024-12-08: qty 2

## 2024-12-08 MED ORDER — CELECOXIB 200 MG PO CAPS: 200.0000 mg | ORAL_CAPSULE | ORAL | Status: AC

## 2024-12-08 MED ORDER — MIDAZOLAM HCL 2 MG/2ML IJ SOLN
INTRAMUSCULAR | Status: AC
  Filled 2024-12-08: qty 2

## 2024-12-08 MED ORDER — DOCUSATE SODIUM 100 MG PO CAPS
100.0000 mg | ORAL_CAPSULE | Freq: Two times a day (BID) | ORAL | Status: DC
  Administered 2024-12-08 – 2024-12-09 (×2): 100 mg via ORAL
  Filled 2024-12-08 (×2): qty 1

## 2024-12-08 MED ORDER — ONDANSETRON 4 MG PO TBDP
4.0000 mg | ORAL_TABLET | Freq: Four times a day (QID) | ORAL | Status: DC | PRN
  Filled 2024-12-08: qty 1

## 2024-12-08 MED ORDER — LINACLOTIDE 145 MCG PO CAPS
290.0000 ug | ORAL_CAPSULE | Freq: Every day | ORAL | Status: DC
  Filled 2024-12-08: qty 2

## 2024-12-08 MED ORDER — FENTANYL CITRATE (PF) 100 MCG/2ML IJ SOLN
INTRAMUSCULAR | Status: AC
  Filled 2024-12-08: qty 2

## 2024-12-08 MED ORDER — HYDROMORPHONE HCL 1 MG/ML IJ SOLN
INTRAMUSCULAR | Status: AC
  Filled 2024-12-08: qty 0.5

## 2024-12-08 MED ORDER — ONDANSETRON HCL 4 MG PO TABS
4.0000 mg | ORAL_TABLET | Freq: Three times a day (TID) | ORAL | Status: DC | PRN
  Administered 2024-12-08: 4 mg via ORAL
  Filled 2024-12-08: qty 1

## 2024-12-08 MED ORDER — HYDRALAZINE HCL 20 MG/ML IJ SOLN: 10.0000 mg | INTRAMUSCULAR | Status: DC | PRN

## 2024-12-08 MED ORDER — INDOCYANINE GREEN 25 MG IJ SOLR
2.5000 mg | Freq: Once | INTRAMUSCULAR | Status: AC
  Administered 2024-12-08: 2.5 mg via INTRAVENOUS
  Filled 2024-12-08: qty 10

## 2024-12-08 MED ORDER — POLYETHYLENE GLYCOL 3350 17 G PO PACK: 17.0000 g | PACK | Freq: Every day | ORAL | Status: DC | PRN

## 2024-12-08 MED ORDER — OXYCODONE HCL 5 MG PO TABS
5.0000 mg | ORAL_TABLET | Freq: Once | ORAL | Status: AC | PRN
  Administered 2024-12-08: 5 mg via ORAL

## 2024-12-08 MED ORDER — MIDAZOLAM HCL (PF) 2 MG/2ML IJ SOLN
INTRAMUSCULAR | Status: DC | PRN
  Administered 2024-12-08: 2 mg via INTRAVENOUS

## 2024-12-08 MED ORDER — ROCURONIUM BROMIDE 10 MG/ML (PF) SYRINGE
PREFILLED_SYRINGE | INTRAVENOUS | Status: DC | PRN
  Administered 2024-12-08: 10 mg via INTRAVENOUS
  Administered 2024-12-08: 50 mg via INTRAVENOUS

## 2024-12-08 MED ORDER — 0.9 % SODIUM CHLORIDE (POUR BTL) OPTIME
TOPICAL | Status: DC | PRN
  Administered 2024-12-08: 1000 mL

## 2024-12-08 MED ORDER — LEVOTHYROXINE SODIUM 50 MCG PO TABS
100.0000 ug | ORAL_TABLET | Freq: Every day | ORAL | Status: DC
  Administered 2024-12-09: 100 ug via ORAL
  Filled 2024-12-08: qty 2

## 2024-12-08 MED ORDER — ONDANSETRON HCL 4 MG/2ML IJ SOLN
INTRAMUSCULAR | Status: DC | PRN
  Administered 2024-12-08: 4 mg via INTRAVENOUS

## 2024-12-08 MED ORDER — ACETAMINOPHEN 500 MG PO TABS: 1000.0000 mg | ORAL_TABLET | ORAL | Status: AC

## 2024-12-08 MED ORDER — DROPERIDOL 2.5 MG/ML IJ SOLN: 0.6250 mg | Freq: Once | INTRAMUSCULAR | Status: DC | PRN

## 2024-12-08 MED ORDER — SODIUM CHLORIDE 0.9 % IR SOLN
Status: DC | PRN
  Administered 2024-12-08: 1

## 2024-12-08 MED ORDER — BUPIVACAINE-EPINEPHRINE (PF) 0.25% -1:200000 IJ SOLN
INTRAMUSCULAR | Status: AC
  Filled 2024-12-08: qty 30

## 2024-12-08 MED ORDER — FENTANYL CITRATE (PF) 250 MCG/5ML IJ SOLN
INTRAMUSCULAR | Status: DC | PRN
  Administered 2024-12-08: 100 ug via INTRAVENOUS

## 2024-12-08 MED ORDER — GABAPENTIN 300 MG PO CAPS
ORAL_CAPSULE | ORAL | Status: AC
  Administered 2024-12-08: 300 mg via ORAL
  Filled 2024-12-08: qty 1

## 2024-12-08 MED ORDER — ENOXAPARIN SODIUM 40 MG/0.4ML IJ SOSY
40.0000 mg | PREFILLED_SYRINGE | INTRAMUSCULAR | Status: DC
  Filled 2024-12-08: qty 0.4

## 2024-12-08 MED ORDER — CELECOXIB 200 MG PO CAPS
ORAL_CAPSULE | ORAL | Status: AC
  Administered 2024-12-08: 200 mg via ORAL
  Filled 2024-12-08: qty 1

## 2024-12-08 MED ORDER — ONDANSETRON HCL 4 MG/2ML IJ SOLN
INTRAMUSCULAR | Status: AC
  Administered 2024-12-08: 4 mg
  Filled 2024-12-08: qty 2

## 2024-12-08 MED ORDER — FENTANYL CITRATE (PF) 100 MCG/2ML IJ SOLN
25.0000 ug | INTRAMUSCULAR | Status: DC | PRN
  Administered 2024-12-08: 50 ug via INTRAVENOUS
  Administered 2024-12-08 (×2): 25 ug via INTRAVENOUS

## 2024-12-08 MED ORDER — OXYCODONE HCL 5 MG/5ML PO SOLN: 5.0000 mg | Freq: Once | ORAL | Status: AC | PRN

## 2024-12-08 MED ORDER — FENTANYL CITRATE (PF) 250 MCG/5ML IJ SOLN
INTRAMUSCULAR | Status: AC
  Filled 2024-12-08: qty 5

## 2024-12-08 MED ORDER — HYDROMORPHONE HCL 1 MG/ML IJ SOLN
1.0000 mg | INTRAMUSCULAR | Status: DC | PRN
  Administered 2024-12-09: 1 mg via INTRAVENOUS
  Filled 2024-12-08: qty 1

## 2024-12-08 MED ORDER — BUPIVACAINE-EPINEPHRINE 0.25% -1:200000 IJ SOLN
INTRAMUSCULAR | Status: DC | PRN
  Administered 2024-12-08: 18 mL

## 2024-12-08 MED ORDER — ENOXAPARIN SODIUM 40 MG/0.4ML IJ SOSY: 40.0000 mg | PREFILLED_SYRINGE | Freq: Once | INTRAMUSCULAR | Status: AC

## 2024-12-08 MED ORDER — OXYCODONE HCL 5 MG PO TABS
ORAL_TABLET | ORAL | Status: AC
  Filled 2024-12-08: qty 1

## 2024-12-08 MED ORDER — PHENYLEPHRINE 80 MCG/ML (10ML) SYRINGE FOR IV PUSH (FOR BLOOD PRESSURE SUPPORT)
PREFILLED_SYRINGE | INTRAVENOUS | Status: DC | PRN
  Administered 2024-12-08 (×2): 160 ug via INTRAVENOUS
  Administered 2024-12-08 (×2): 80 ug via INTRAVENOUS

## 2024-12-08 MED ORDER — LOSARTAN POTASSIUM 50 MG PO TABS
50.0000 mg | ORAL_TABLET | Freq: Every day | ORAL | Status: DC
  Administered 2024-12-08 – 2024-12-09 (×2): 50 mg via ORAL
  Filled 2024-12-08 (×2): qty 1

## 2024-12-08 MED ORDER — CHLORHEXIDINE GLUCONATE 0.12 % MT SOLN: 15.0000 mL | Freq: Once | OROMUCOSAL | Status: AC

## 2024-12-08 MED ORDER — CEFAZOLIN SODIUM-DEXTROSE 2-4 GM/100ML-% IV SOLN
INTRAVENOUS | Status: AC
  Filled 2024-12-08: qty 100

## 2024-12-08 MED ORDER — CHLORHEXIDINE GLUCONATE 0.12 % MT SOLN
OROMUCOSAL | Status: AC
  Administered 2024-12-08: 15 mL via OROMUCOSAL
  Filled 2024-12-08: qty 15

## 2024-12-08 MED ORDER — PROPOFOL 10 MG/ML IV BOLUS
INTRAVENOUS | Status: AC
  Filled 2024-12-08: qty 20

## 2024-12-08 MED ORDER — SUGAMMADEX SODIUM 200 MG/2ML IV SOLN
INTRAVENOUS | Status: DC | PRN
  Administered 2024-12-08: 200 mg via INTRAVENOUS

## 2024-12-08 MED ORDER — LIDOCAINE 2% (20 MG/ML) 5 ML SYRINGE
INTRAMUSCULAR | Status: DC | PRN
  Administered 2024-12-08: 100 mg via INTRAVENOUS

## 2024-12-08 MED ORDER — ORAL CARE MOUTH RINSE: 15.0000 mL | Freq: Once | OROMUCOSAL | Status: AC

## 2024-12-08 MED ORDER — SENNA 8.6 MG PO TABS
1.0000 | ORAL_TABLET | Freq: Two times a day (BID) | ORAL | Status: DC
  Administered 2024-12-08 – 2024-12-09 (×2): 8.6 mg via ORAL
  Filled 2024-12-08 (×2): qty 1

## 2024-12-08 MED ORDER — LACTATED RINGERS IV SOLN: INTRAVENOUS | Status: DC

## 2024-12-08 MED ORDER — CEFAZOLIN SODIUM-DEXTROSE 2-4 GM/100ML-% IV SOLN
2.0000 g | INTRAVENOUS | Status: AC
  Administered 2024-12-08: 2 g via INTRAVENOUS

## 2024-12-08 MED ORDER — ENOXAPARIN SODIUM 40 MG/0.4ML IJ SOSY
PREFILLED_SYRINGE | INTRAMUSCULAR | Status: AC
  Administered 2024-12-08: 40 mg via SUBCUTANEOUS
  Filled 2024-12-08: qty 0.4

## 2024-12-08 MED ORDER — PROPOFOL 10 MG/ML IV BOLUS
INTRAVENOUS | Status: DC | PRN
  Administered 2024-12-08: 150 mg via INTRAVENOUS

## 2024-12-08 SURGICAL SUPPLY — 42 items
BAG COUNTER SPONGE SURGICOUNT (BAG) ×1 IMPLANT
CANISTER SUCTION 3000ML PPV (SUCTIONS) ×1 IMPLANT
CHLORAPREP W/TINT 26 (MISCELLANEOUS) ×1 IMPLANT
CLIP APPLIE ROT 10 11.4 M/L (STAPLE) IMPLANT
CLIP LIGATING HEMO O LOK GREEN (MISCELLANEOUS) ×1 IMPLANT
CNTNR URN SCR LID CUP LEK RST (MISCELLANEOUS) ×1 IMPLANT
COVER MAYO STAND STRL (DRAPES) IMPLANT
COVER SURGICAL LIGHT HANDLE (MISCELLANEOUS) ×1 IMPLANT
DERMABOND ADVANCED .7 DNX12 (GAUZE/BANDAGES/DRESSINGS) ×1 IMPLANT
DRAPE C-ARM 42X120 X-RAY (DRAPES) IMPLANT
ELECTRODE REM PT RTRN 9FT ADLT (ELECTROSURGICAL) ×1 IMPLANT
GAUZE 4X4 16PLY ~~LOC~~+RFID DBL (SPONGE) ×1 IMPLANT
GLOVE BIOGEL PI MICRO STRL 6 (GLOVE) ×1 IMPLANT
GLOVE INDICATOR 6.5 STRL GRN (GLOVE) ×1 IMPLANT
GOWN STRL REUS W/ TWL LRG LVL3 (GOWN DISPOSABLE) ×1 IMPLANT
GRASPER SUT TROCAR 14GX15 (MISCELLANEOUS) ×1 IMPLANT
IRRIGATION SUCT STRKRFLW 2 WTP (MISCELLANEOUS) ×1 IMPLANT
KIT BASIN OR (CUSTOM PROCEDURE TRAY) ×1 IMPLANT
KIT IMAGING PINPOINTPAQ (MISCELLANEOUS) IMPLANT
KIT TURNOVER KIT B (KITS) ×1 IMPLANT
LHOOK LAP DISP 36CM (ELECTROSURGICAL) ×1 IMPLANT
NEEDLE 22X1.5 STRL (OR ONLY) (MISCELLANEOUS) ×1 IMPLANT
NEEDLE INSUFFLATION 14GA 120MM (NEEDLE) ×1 IMPLANT
PAD ARMBOARD POSITIONER FOAM (MISCELLANEOUS) ×1 IMPLANT
PENCIL BUTTON HOLSTER BLD 10FT (ELECTRODE) ×1 IMPLANT
POUCH LAPAROSCOPIC INSTRUMENT (MISCELLANEOUS) ×1 IMPLANT
SCISSORS LAP 5X35 DISP (ENDOMECHANICALS) ×1 IMPLANT
SET CHOLANGIOGRAPH 5 50 .035 (SET/KITS/TRAYS/PACK) IMPLANT
SET TUBE SMOKE EVAC HIGH FLOW (TUBING) ×1 IMPLANT
SLEEVE Z-THREAD 5X100MM (TROCAR) ×2 IMPLANT
SOLN 0.9% NACL POUR BTL 1000ML (IV SOLUTION) ×1 IMPLANT
SOLN STERILE WATER BTL 1000 ML (IV SOLUTION) ×1 IMPLANT
SPONGE T-LAP 18X18 ~~LOC~~+RFID (SPONGE) ×1 IMPLANT
SUT MNCRL AB 4-0 PS2 18 (SUTURE) ×1 IMPLANT
SUT VICRYL 0 UR6 27IN ABS (SUTURE) IMPLANT
SYSTEM BAG RETRIEVAL 10MM (BASKET) ×1 IMPLANT
TOWEL GREEN STERILE (TOWEL DISPOSABLE) ×1 IMPLANT
TOWEL GREEN STERILE FF (TOWEL DISPOSABLE) ×1 IMPLANT
TRAY LAPAROSCOPIC MC (CUSTOM PROCEDURE TRAY) ×1 IMPLANT
TROCAR Z THREAD OPTICAL 12X100 (TROCAR) ×1 IMPLANT
TROCAR Z-THREAD OPTICAL 5X100M (TROCAR) ×1 IMPLANT
WARMER LAPAROSCOPE (MISCELLANEOUS) ×1 IMPLANT

## 2024-12-08 NOTE — Anesthesia Preprocedure Evaluation (Addendum)
"                                    Anesthesia Evaluation  Patient identified by MRN, date of birth, ID band Patient awake    Reviewed: Allergy & Precautions, H&P , NPO status , Patient's Chart, lab work & pertinent test results  History of Anesthesia Complications Negative for: history of anesthetic complications  Airway Mallampati: II  TM Distance: >3 FB Neck ROM: Full    Dental no notable dental hx.    Pulmonary asthma , sleep apnea and Continuous Positive Airway Pressure Ventilation , former smoker   Pulmonary exam normal breath sounds clear to auscultation       Cardiovascular hypertension, (-) angina + DVT  (-) Past MI Normal cardiovascular exam Rhythm:Regular Rate:Normal     Neuro/Psych  Headaches, Seizures -, Well Controlled,  PSYCHIATRIC DISORDERS Anxiety Depression       GI/Hepatic Neg liver ROS,GERD  ,,  Endo/Other  Hypothyroidism    Renal/GU negative Renal ROS  negative genitourinary   Musculoskeletal  (+) Arthritis ,    Abdominal   Peds negative pediatric ROS (+)  Hematology negative hematology ROS (+)   Anesthesia Other Findings   Reproductive/Obstetrics negative OB ROS                              Anesthesia Physical Anesthesia Plan  ASA: 3  Anesthesia Plan: General   Post-op Pain Management: Tylenol  PO (pre-op)*   Induction: Intravenous  PONV Risk Score and Plan: 3 and Ondansetron , Dexamethasone , Midazolam  and Treatment may vary due to age or medical condition  Airway Management Planned: Oral ETT  Additional Equipment: None  Intra-op Plan:   Post-operative Plan: Extubation in OR  Informed Consent: I have reviewed the patients History and Physical, chart, labs and discussed the procedure including the risks, benefits and alternatives for the proposed anesthesia with the patient or authorized representative who has indicated his/her understanding and acceptance.     Dental advisory  given  Plan Discussed with: CRNA  Anesthesia Plan Comments:          Anesthesia Quick Evaluation  "

## 2024-12-08 NOTE — Anesthesia Postprocedure Evaluation (Signed)
"   Anesthesia Post Note  Patient: Olivia Zamora  Procedure(s) Performed: LAPAROSCOPIC CHOLECYSTECTOMY (Abdomen)     Patient location during evaluation: PACU Anesthesia Type: General Level of consciousness: awake and alert Pain management: pain level controlled Vital Signs Assessment: post-procedure vital signs reviewed and stable Respiratory status: spontaneous breathing, nonlabored ventilation, respiratory function stable and patient connected to nasal cannula oxygen Cardiovascular status: blood pressure returned to baseline and stable Postop Assessment: no apparent nausea or vomiting Anesthetic complications: no   No notable events documented.  Last Vitals:  Vitals:   12/08/24 1715 12/08/24 1730  BP: 120/73 (!) 122/57  Pulse: 71 65  Resp: 13 10  Temp:    SpO2: 100% 100%    Last Pain:  Vitals:   12/08/24 1738  TempSrc:   PainSc: 10-Worst pain ever                 Thom JONELLE Peoples      "

## 2024-12-08 NOTE — Transfer of Care (Signed)
 Immediate Anesthesia Transfer of Care Note  Patient: Olivia Zamora  Procedure(s) Performed: LAPAROSCOPIC CHOLECYSTECTOMY (Abdomen)  Patient Location: PACU  Anesthesia Type:General  Level of Consciousness: drowsy  Airway & Oxygen Therapy: Patient Spontanous Breathing and Patient connected to face mask oxygen  Post-op Assessment: Report given to RN, Post -op Vital signs reviewed and stable, and Patient moving all extremities  Post vital signs: Reviewed and stable  Last Vitals:  Vitals Value Taken Time  BP 128/59 12/08/24 16:56  Temp    Pulse 78 12/08/24 16:59  Resp 11 12/08/24 16:59  SpO2 95 % 12/08/24 16:59  Vitals shown include unfiled device data.  Last Pain:  Vitals:   12/08/24 1340  TempSrc:   PainSc: 0-No pain         Complications: No notable events documented.

## 2024-12-08 NOTE — Plan of Care (Signed)
" °  Problem: Education: Goal: Knowledge of General Education information will improve Description: Including pain rating scale, medication(s)/side effects and non-pharmacologic comfort measures 12/08/2024 1900 by Faustino Suzen HERO, RN Outcome: Progressing 12/08/2024 1900 by Faustino Suzen HERO, RN Outcome: Progressing   Problem: Health Behavior/Discharge Planning: Goal: Ability to manage health-related needs will improve 12/08/2024 1900 by Faustino Suzen HERO, RN Outcome: Progressing 12/08/2024 1900 by Faustino Suzen HERO, RN Outcome: Progressing   Problem: Clinical Measurements: Goal: Ability to maintain clinical measurements within normal limits will improve 12/08/2024 1900 by Faustino Suzen HERO, RN Outcome: Progressing 12/08/2024 1900 by Faustino Suzen HERO, RN Outcome: Progressing Goal: Will remain free from infection 12/08/2024 1900 by Faustino Suzen HERO, RN Outcome: Progressing 12/08/2024 1900 by Faustino Suzen HERO, RN Outcome: Progressing Goal: Diagnostic test results will improve 12/08/2024 1900 by Faustino Suzen HERO, RN Outcome: Progressing 12/08/2024 1900 by Faustino Suzen HERO, RN Outcome: Progressing Goal: Respiratory complications will improve Outcome: Progressing Goal: Cardiovascular complication will be avoided Outcome: Progressing   Problem: Activity: Goal: Risk for activity intolerance will decrease Outcome: Progressing   Problem: Nutrition: Goal: Adequate nutrition will be maintained Outcome: Progressing   Problem: Elimination: Goal: Will not experience complications related to bowel motility Outcome: Progressing Goal: Will not experience complications related to urinary retention Outcome: Progressing   Problem: Pain Managment: Goal: General experience of comfort will improve and/or be controlled Outcome: Progressing   Problem: Safety: Goal: Ability to remain free from injury will improve Outcome: Progressing   "

## 2024-12-08 NOTE — H&P (Signed)
 "   HPI  Rhian Asebedo is an 53 y.o. female who was seen in clinic on 11/25/24 for biliary colic  Patient has had several months of intermittent abdominal pain, primarily RUQ. Has nausea at baseline from wegovy . Has since stopped wegovy , last dose 2 weeks ago. Patient also has chronic constipation and has pain due to this and has tried many laxatives. GI recommended bowel purge and ibsrela. Patient today still feels constipated. She had episode of severe pain on 12/10 and went to ED. Pain resolved and patient discharged from ED with only mildly elevated LFTs. US  obtained outpatient that showed cholelithiasis and dilated CBD to 10mm. Subsequent MRCP showed CBD dilation to 8mm with narrowing of distal CBD and possible dilation.   I discussed case with GI regarding their thoughts on MRCP findings and whether we should pursue ERCP prior to operation. Given downtrending LFTs and no evidence of jaundice, they felt reasonable to proceed with lap chole with IOC and then refer to GI if any abnormal findings on IOC.  10 point review of systems is negative except as listed above in HPI.  Objective  Past Medical History: Past Medical History:  Diagnosis Date   Anorexia    Anxiety    Failed therapy with Paxil, Lexparo, Prozac, Wellbutrin , Xanax, and Ativan   Arthritis    Asthma    Bulimia (HCC)    Cardiac disease    Angina- pt denies chest pain in past   Chronic back pain    Constipation 08/26/2019   Depression    Failed therapy with Paxil, Lexparo, Prozac, Wellbutrin , and Ativan   Disease of thyroid  gland 12/28/2011   multiple thyroid  nodules in 2013 - US  on 03/08/14 showed heterogeneous appearance of thyroid  gland without focal nodule   Eczema    Generalized seizure (HCC)    stress related- 2010?   GERD (gastroesophageal reflux disease)    History of DVT (deep vein thrombosis) 1988   in neck per pt report   Hypertension    Hyperthyroidism    Hypothyroidism    IBS (irritable bowel syndrome)     Internal derangement of left knee    Lactose intolerance    Migraine    Sleep apnea    wears CPAP   Vitamin D  deficiency     Past Surgical History: Past Surgical History:  Procedure Laterality Date   CESAREAN SECTION     x3   COLONOSCOPY     HEMANGIOMA EXCISION Right    shoulder   IR RADIOLOGIST EVAL & MGMT  05/04/2021   MASS EXCISION Right    lump from palm of hand   OOPHORECTOMY     OTHER SURGICAL HISTORY Right    Repair of tib-fib fracture   TOTAL ABDOMINAL HYSTERECTOMY     TOTAL KNEE ARTHROPLASTY Left 06/04/2023   Procedure: LEFT TOTAL KNEE ARTHROPLASTY;  Surgeon: Sheril Coy, MD;  Location: [facility];  Service: Orthopedics;  Laterality: Left;   TUBAL LIGATION     TUMOR EXCISION Right    Axilla - benign   VASCULAR SURGERY Right 1989   blood clot removed from neck    Family History:  Family History  Problem Relation Age of Onset   Arthritis Mother    Hypertension Mother    Thyroid  disease Mother    Depression Mother    Migraines Mother    Deep vein thrombosis Mother        in her leg   Arthritis Father    Hypertension Father  Depression Father    CVA Father    Factor V Leiden deficiency Sister    CVA Sister        30s   Thyroid  disease Sister    Breast cancer Sister    Multiple sclerosis Sister    Other Sister        guillain barre   Depression Sister    Migraines Sister    CVA Sister    Thyroid  disease Sister    Depression Sister    Migraines Sister    Hypertension Maternal Grandmother    Depression Maternal Grandmother    Anxiety disorder Maternal Grandmother    Hypertension Maternal Grandfather    Hypertension Paternal Grandmother    Hypertension Paternal Grandfather    Asthma Daughter    Depression Daughter    Anxiety disorder Daughter    Irritable bowel syndrome Daughter    Lactose intolerance Daughter    Migraines Daughter    Asthma Son    Depression Son    Anxiety disorder Son    Irritable bowel syndrome Son    Lactose  intolerance Son    Migraines Son    Asthma Son    Depression Son    Anxiety disorder Son    Irritable bowel syndrome Son    Lactose intolerance Son    Migraines Son    Asthma Niece    Other Niece        antiphospholipid AB syndrome   Colon cancer Neg Hx    Esophageal cancer Neg Hx    Rectal cancer Neg Hx    Stomach cancer Neg Hx     Social History:  reports that she has quit smoking. Her smoking use included cigarettes. She started smoking about 41 years ago. She has a 0.5 pack-year smoking history. She has never been exposed to tobacco smoke. She has never used smokeless tobacco. She reports that she does not drink alcohol and does not use drugs.  Allergies: Allergies[1]  Medications: I have reviewed the patient's current medications.  Labs: Pertinent lab work personally reviewed.  Imaging: Pertinent imaging personally reviewed MRCP 11/25/24: CBD dilation to 8mm with short segment of distal CBD narrowing with beak-like configuration, possible stricture. Partially decompressed FB with GB wall 3-3.40mm, few tiny gallstones. US  RUQ 11/19/24: Multiple gallstones with no evidence of acute cholecystitis. Dilated CBD 10mm CT Renal 11/18/24: Gallbladder unremarkable without biliary ductal dilatation.  Physical Exam Height 5' 7 (1.702 m), weight 84.4 kg. Gen: NAD CV: RRR Pulm: NWOB Abd: Soft nontender nondistended    Assessment   Windy Dudek is an 53 y.o. female with biliary colic  Plan  - Proceed to OR for laparoscopic cholecystectomy with IOC - We discussed the etiology of patient's pain, we discussed treatment options and recommended surgery. We discussed details of surgery including general anesthesia, laparoscopic approach, identification of cystic duct and common bile duct. Ligation of cystic duct and cystic artery. Possible need for intraoperative cholangiogram, open procedure, and subtotal cholecystectomy. Possible risks of common bile duct injury, injury to surrounding  structures, bile leak, bleeding, infection, diarrhea, retained stone and hernia. The patient showed good understanding and all questions were answered    Orie Silversmith, MD General Surgery, Surgical Critical Care and Trauma       [1]  Allergies Allergen Reactions   Iodine  Anaphylaxis   Iohexol  Anaphylaxis    Pre meds given in past with no reaction noted.     Latex Anaphylaxis and Other (See Comments)    latex  Other Reaction(s): Not available, Other (See Comments)    latex   Topamax [Topiramate] Anaphylaxis   Zanaflex  [Tizanidine ] Shortness Of Breath    Swelling, chest pain    Prednisone      Other Reaction(s): SOB (All Steroids) Makes me feel terrible, but I can take it if I need it   "

## 2024-12-08 NOTE — Anesthesia Procedure Notes (Signed)
 Procedure Name: Intubation Date/Time: 12/08/2024 3:44 PM  Performed by: Jerl Donald LABOR, CRNAPre-anesthesia Checklist: Patient identified, Emergency Drugs available, Suction available and Patient being monitored Patient Re-evaluated:Patient Re-evaluated prior to induction Oxygen Delivery Method: Circle System Utilized Preoxygenation: Pre-oxygenation with 100% oxygen Induction Type: IV induction Ventilation: Mask ventilation without difficulty Laryngoscope Size: Mac and 3 Grade View: Grade I Tube type: Oral Tube size: 7.0 mm Number of attempts: 1 Airway Equipment and Method: Stylet and Oral airway Placement Confirmation: ETT inserted through vocal cords under direct vision, positive ETCO2 and breath sounds checked- equal and bilateral Secured at: 21 cm Tube secured with: Tape Dental Injury: Teeth and Oropharynx as per pre-operative assessment

## 2024-12-08 NOTE — Op Note (Signed)
 12/08/2024 3:39 PM  PATIENT: Olivia Zamora  53 y.o. female  Patient Care Team: Rakes, Rock HERO, FNP as PCP - General (Family Medicine)  PRE-OPERATIVE DIAGNOSIS: biliary colic  POST-OPERATIVE DIAGNOSIS: biliary colic with chronic cholecystitis  PROCEDURE: laparoscopic cholecystectomy with indocyanine green   SURGEON: Orie Silversmith, MD  ASSISTANT: None  ANESTHESIA: General endotracheal  EBL: 3cc  DRAINS: None  SPECIMEN: Gallbladder  COUNTS: Sponge, needle and instrument counts were reported correct x2 at the conclusion of the operation  DISPOSITION: PACU in satisfactory condition  COMPLICATIONS: None  FINDINGS: ICG fluorescence through gallbladder, cystic duct, common bile duct, common hepatic duct and into small intestine. Unable to perform IOC due to anaphylactic reaction to iohexol . Critical view achieved prior to clipping cystic duct and cystic artery  DESCRIPTION:  Preoperative indocyanine green  was administered in preoperative holding. The patient was identified & brought into the operating room. She was then positioned supine on the OR table. SCDs were in place and active during the entire case. She then underwent general endotracheal anesthesia. Pressure points were padded. Hair on the abdomen was clipped by the OR team. The abdomen was prepped and draped in the standard sterile fashion. Antibiotics were administered. A surgical timeout was performed and confirmed our plan.  A small incision was made in the LUQ at Palmer's point and a veress needle was inserted. Air was aspirated and subsequent positive drop test. Abdomen then insufflated to . A periumbilical incision was then made and a 5mm trocar optiview using a 30 degree scope was inserted and the abdomen was entered under direct visualization. Inspection confirmed no evidence of trocar or veress site complications. The veress was then removed.   The patient was then positioned in reverse Trendelenburg with slight  left side down. A 12 mm supxiphoid trocar was placed under direct visualization and  two additional 5mm trocars were placed along the right subcostal line - one 5mm port in mid subcostal region, another 5mm port in the right flank near the anterior axillary line.  The liver and gallbladder were inspected. The gallbladder appeared edematous consistent with chronic cholecystitis. The gallbladder fundus was grasped and elevated cephalad. An additional grasper was then placed on the infundibulum of the gallbladder and the infundibulum was retracted laterally. Staying high on the gallbladder, the peritoneum on both sides of the gallbladder was opened with hook cautery. Gentle blunt dissection was then employed with a [place]  dissector working down into [employer]. The cystic duct was identified and carefully circumferentially dissected. The cystic artery was also identified and carefully circumferentially dissected. The space between the cystic artery and hepatocystic plate was developed such that a good view of the liver could be seen through a window medial to the cystic artery. The triangle of Calot had been cleared of all fibrofatty tissue. At this point, a critical view of safety was achieved and the only structures visualized was the skeletonized cystic duct laterally, the skeletonized cystic artery and the liver through the window medial to the artery. No posterior cystic artery was noted  Attention turned to infrared fluorescent cholangiography with indocyanine green  which was visualized within the common hepatic duct, common bile duct, cystic duct and small bowel.  The cystic duct and artery were clipped with 2 hemolock clips on the patient side and 1 clip on the specimen side. The cystic duct and artery were then divided. The gallbladder was then freed from its remaining attachments to the liver using electrocautery and placed into an endocatch bag. The RUQ was  gently irrigated with sterile saline.  Hemostasis was then verified. The clips were in good position; the gallbladder fossa was dry. The rest of the abdomen was inspected no injury nor bleeding elsewhere was identified.  The endocatch bag containing the gallbladder was then removed from the subxiphoid port site and passed off as specimen. The subxiphoid port fascia was then closed in a figured of eight fashion with 0 vicryl using a suture passer. The RUQ ports were removed under direct visualization and noted to be hemostatic.SABRA The fascia was palpated and noted to be completely closed. The abdomen was then desufflated and the periumbilical trocar removed. The skin of all incision sites was approximated with 4-0 monocryl subcuticular suture and dermabond applied. She was then awakened from anesthesia, extubated, and transferred to a stretcher for transport to PACU in satisfactory condition.  Instrument, sponge, and needle counts were correct at closure and at the conclusion of the case.   Orie Silversmith, MD [facility]

## 2024-12-09 ENCOUNTER — Inpatient Hospital Stay (HOSPITAL_COMMUNITY): Admission: RE | Admit: 2024-12-09

## 2024-12-09 ENCOUNTER — Other Ambulatory Visit (HOSPITAL_COMMUNITY): Payer: Self-pay

## 2024-12-09 ENCOUNTER — Encounter (HOSPITAL_COMMUNITY): Payer: Self-pay | Admitting: General Surgery

## 2024-12-09 DIAGNOSIS — K8010 Calculus of gallbladder with chronic cholecystitis without obstruction: Secondary | ICD-10-CM | POA: Diagnosis not present

## 2024-12-09 LAB — CBC
HCT: 38.1 % (ref 36.0–46.0)
Hemoglobin: 13 g/dL (ref 12.0–15.0)
MCH: 31.3 pg (ref 26.0–34.0)
MCHC: 34.1 g/dL (ref 30.0–36.0)
MCV: 91.6 fL (ref 80.0–100.0)
Platelets: 164 K/uL (ref 150–400)
RBC: 4.16 MIL/uL (ref 3.87–5.11)
RDW: 13 % (ref 11.5–15.5)
WBC: 6.6 K/uL (ref 4.0–10.5)
nRBC: 0 % (ref 0.0–0.2)

## 2024-12-09 LAB — COMPREHENSIVE METABOLIC PANEL WITH GFR
ALT: 625 U/L — ABNORMAL HIGH (ref 0–44)
AST: 181 U/L — ABNORMAL HIGH (ref 15–41)
Albumin: 3.9 g/dL (ref 3.5–5.0)
Alkaline Phosphatase: 151 U/L — ABNORMAL HIGH (ref 38–126)
Anion gap: 8 (ref 5–15)
BUN: 10 mg/dL (ref 6–20)
CO2: 28 mmol/L (ref 22–32)
Calcium: 8.9 mg/dL (ref 8.9–10.3)
Chloride: 106 mmol/L (ref 98–111)
Creatinine, Ser: 0.7 mg/dL (ref 0.44–1.00)
GFR, Estimated: >60 mL/min
Glucose, Bld: 129 mg/dL — ABNORMAL HIGH (ref 70–99)
Potassium: 3.9 mmol/L (ref 3.5–5.1)
Sodium: 142 mmol/L (ref 135–145)
Total Bilirubin: 0.7 mg/dL (ref 0.0–1.2)
Total Protein: 5.8 g/dL — ABNORMAL LOW (ref 6.5–8.1)

## 2024-12-09 LAB — PHOSPHORUS: Phosphorus: 3.9 mg/dL (ref 2.5–4.6)

## 2024-12-09 LAB — MAGNESIUM: Magnesium: 2.2 mg/dL (ref 1.7–2.4)

## 2024-12-09 MED ORDER — OXYCODONE HCL 5 MG PO TABS
5.0000 mg | ORAL_TABLET | ORAL | 0 refills | Status: AC | PRN
  Filled 2024-12-09: qty 15, 3d supply, fill #0

## 2024-12-09 NOTE — TOC Transition Note (Signed)
 Transition of Care [facility]) - Discharge Note   Patient Details  Name: Olivia Zamora MRN: [id number] Date of Birth: 1971-01-18  [facility]) CM/SW Contact:  Roxie KANDICE Stain, RN Phone Number: 12/09/2024, 12:53 PM   Clinical Narrative:     Olivia Zamora is stable to discharge home. Follow up apt on [facility]. No ICM (Inpatient Care Management) needs at this time.   Final next level of care: Home/Self Care Barriers to Discharge: Barriers Resolved   Patient Goals and CMS Choice Patient states their goals for this hospitalization and ongoing recovery are:: return home          Discharge Placement               Home        Discharge Plan and Services Additional resources added to the After Visit Summary for                                       Social Drivers of Health (SDOH) Interventions SDOH Screenings   Food Insecurity: No Food Insecurity (12/08/2024)  Housing: Low Risk (12/08/2024)  Transportation Needs: No Transportation Needs (12/08/2024)  Utilities: Not At Risk (12/08/2024)  Depression (PHQ2-9): Low Risk (10/13/2024)  Financial Resource Strain: Low Risk (10/11/2024)  Physical Activity: Insufficiently Active (10/11/2024)  Social Connections: Moderately Isolated (10/11/2024)  Stress: No Stress Concern Present (10/11/2024)  Tobacco Use: Medium Risk (12/02/2024)     Readmission Risk Interventions     No data to display

## 2024-12-09 NOTE — Discharge Summary (Signed)
 [facility] Discharge Summary   [name] MRN: [id number] DOB/AGE: February 27, 1971 53 y.o.  Admit date: 12/08/2024 Discharge date: 12/09/2024  Admitting Diagnosis: Biliary colic, transaminitis  Discharge Diagnosis Patient Active Problem List   Diagnosis Date Noted   Biliary colic 12/08/2024   BMI 30.0-30.9,adult 08/06/2024   Angina pectoris, unspecified 08/05/2024   Dermatitis, unspecified 08/05/2024   Gastro-esophageal reflux disease without esophagitis 08/05/2024   Major depression, single episode 08/05/2024   Non-refractory chronic migraine without aura 08/05/2024   Non-toxic multinodular goiter 08/05/2024   Osteoarthritis of knee 08/05/2024   Primary generalized (osteo)arthritis 08/05/2024   Pure hypercholesterolemia 08/05/2024   Prediabetes 11/26/2023   Eating disorder in remission 11/26/2023   History of anorexia nervosa 11/12/2023   History of bulimia nervosa with history of laxative use, diuretics, and purging 11/12/2023   Panic disorder with agoraphobia 11/12/2023   PTSD (post-traumatic stress disorder) 11/12/2023   Mixed hyperlipidemia 05/09/2023   Essential hypertension 11/21/2022   Restless legs 11/21/2022   Controlled substance agreement signed 11/21/2022   Acquired hypothyroidism 11/21/2022   AVM (arteriovenous malformation) 03/01/2022   H/O: CVA (cerebrovascular accident) 08/01/2021   History of DVT (deep vein thrombosis) 08/01/2021   Symptomatic mammary hypertrophy 06/13/2021   IBS (irritable bowel syndrome) 08/26/2019   Vitamin D  deficiency    Sleep apnea    Recurrent major depressive disorder in partial remission    Generalized anxiety disorder    Asthma    Disease of thyroid  gland 12/28/2011    Consultants GI- Dr. Legrand  Imaging: No results found.  Procedures Laparoscopic cholecystectomy with ICG 12/08/24  Hospital Course:  Patient is a 53 yo F who is was taken to OR for laparoscopic cholecystectomy on 12/30 for  biliary colic. Patient had elevated AST/SLT/ALP pre op, normal bilirubin. Unable to perform IOC due to anaphylactic allergy to iohexol . Case with discussed with GI (Dr. Legrand) and given LFTs continuing to downtrend post-op, suspect this may all be related to extensive gallbladder inflammation. Okay to discharge home from GI perspective. GI will arrange for outpatient labs in ~1 week to ensure LFTs still downtrending and discuss with Dr. Wilhelmenia. If any abnormalities plan to have Dr. Wilhelmenia follow up to determine if needs outpatient EUS/ERCP.  Patient was tolerated diet post-op. Had some nausea/emesis POD 1 related to narcotic pain medication administration which quickly resolved. Tolerated diet after this and pain controlled on PO medications and was then discharged home in stable condition with planned outpatient follow up.   Physical Exam: General:  Alert, NAD, pleasant, comfortable Abd:  Soft, appropriately tender, nondistended. Incisions well approximated without erythema or drainage.  I or a member of my team have reviewed this patient in the Controlled Substance Database.   Allergies as of 12/09/2024       Reactions   Iodine  Anaphylaxis   Iohexol  Anaphylaxis   Pre meds given in past with no reaction noted.     Latex Anaphylaxis, Other (See Comments)   latex Other Reaction(s): Not available, Other (See Comments)    latex   Topamax [topiramate] Anaphylaxis   Zanaflex  [tizanidine ] Shortness Of Breath   Swelling, chest pain    Prednisone     Other Reaction(s): SOB (All Steroids) Makes me feel terrible, but I can take it if I need it        Medication List     TAKE these medications    Ajovy  225 MG/1.5ML Soaj Generic drug: Fremanezumab -vfrm Inject 225 mg into the skin every 30 (thirty) days.  docusate sodium  100 MG capsule Commonly known as: COLACE Take 1 capsule (100 mg total) by mouth daily. What changed:  when to take this reasons to take this    furosemide 20 MG tablet Commonly known as: LASIX Take 20 mg by mouth daily as needed for edema or fluid.   levalbuterol  0.63 MG/3ML nebulizer solution Commonly known as: XOPENEX  TAKE 3 MLS BY NEBULIZATION EVERY 4 HOURS AS NEEDED FOR WHEEZING, SHORTNESS OF BREATH OR COUGH FITS   levalbuterol  45 MCG/ACT inhaler Commonly known as: Xopenex  HFA Inhale 2 puffs into the lungs every 4 (four) hours as needed for wheezing or shortness of breath (coughing fits).   linaclotide  290 MCG Caps capsule Commonly known as: Linzess  Take 1 capsule (290 mcg total) by mouth daily before breakfast.   losartan  50 MG tablet Commonly known as: COZAAR  Take 1 tablet (50 mg total) by mouth daily.   Nurtec 75 MG Tbdp Generic drug: Rimegepant Sulfate Take 1 tablet (75 mg total) by mouth as needed (migraine).   ondansetron  4 MG tablet Commonly known as: Zofran  Take 1 tablet (4 mg total) by mouth every 8 (eight) hours as needed for nausea or vomiting.   oxyCODONE  5 MG immediate release tablet Commonly known as: Roxicodone  Take 1 tablet (5 mg total) by mouth every 4 (four) hours as needed for severe pain (pain score 7-10).   pantoprazole  40 MG tablet Commonly known as: PROTONIX  Take 1 tablet (40 mg total) by mouth 2 (two) times daily.   Spiriva  Respimat 1.25 MCG/ACT Aers Generic drug: Tiotropium Bromide INHALE 2 PUFFS INTO THE LUNGS ONCE DAILY.   Synthroid  100 MCG tablet Generic drug: levothyroxine  Take 100 mcg by mouth daily before breakfast.   Vitamin D  (Ergocalciferol ) 1.25 MG (50000 UNIT) Caps capsule Commonly known as: DRISDOL Take 50,000 Units by mouth every 7 (seven) days.   Zepbound  2.5 MG/0.5ML Pen Generic drug: tirzepatide  Inject 2.5 mg into the skin once a week.           Signed: Orie Silversmith, MD General Surgery, Surgical Critical Care and Trauma 12/09/2024, 10:14 AM

## 2024-12-09 NOTE — TOC CM/SW Note (Signed)
 Transition of Care [facility]) - Inpatient Brief Assessment   Patient Details  Name: Daysi Boggan MRN: [id number] Date of Birth: Jan 19, 1971  [facility]) CM/SW Contact:    Lauraine FORBES Saa, LCSWA Phone Number: 12/09/2024, 9:08 AM   Clinical Narrative:  9:08 AM Per chart review, patient resides at home with spouse and child(ren). Patient has a PCP and insurance on file. Patient does not have SNF or HH history on chart but has DME (CPAP) history with [employer] from 2020. [facility]'s preferred [facility]'s [facility] [address] No TOC needs identified at this time. TOC will continue to follow.  Transition of Care Asessment: Insurance and Status: Insurance coverage has been reviewed Patient has primary care physician: Yes Home environment has been reviewed: Private Residence Prior level of function:: N/A Prior/Current Home Services: No current home services Social Drivers of Health Review: SDOH reviewed no interventions necessary Readmission risk has been reviewed: Yes (Currently Observation Status) Transition of care needs: no transition of care needs at this time

## 2024-12-11 ENCOUNTER — Other Ambulatory Visit: Payer: Self-pay

## 2024-12-11 DIAGNOSIS — R7989 Other specified abnormal findings of blood chemistry: Secondary | ICD-10-CM

## 2024-12-13 LAB — SURGICAL PATHOLOGY

## 2024-12-14 ENCOUNTER — Telehealth: Payer: Self-pay

## 2024-12-14 DIAGNOSIS — R7989 Other specified abnormal findings of blood chemistry: Secondary | ICD-10-CM

## 2024-12-14 NOTE — Telephone Encounter (Signed)
-----   Message from Aloha Finner, MD sent at 12/09/2024 10:49 AM EST ----- Regarding: RE: [facility] labs HD, [place]. Ill review when they return.  Vernel Langenderfer, Make sure some followup with Deanna or with Dr Federico as well for February.  LFTs under my name right now since Ill be around next week. Thanks. ----- Message ----- From: Legrand Victory LITTIE DOUGLAS, MD Sent: 12/09/2024   9:45 AM EST To: Odetta LITTIE Curly, RN; Aloha Finner Raddle., # Subject: [facility] labs                             Gabe,  This patient of Dr. Ann ([facility]) had of elevated LFTs, gallbladder issues and MRCP for that last month, and issue that you give an opinion on.  She came in for cholecystectomy and had significantly elevated LFTs. Dr. Ann did cholecystectomy and found a chronically inflamed gallbladder which precluded IOC.  LFTs getting better, most likely with resolution of the cholecystitis. She asked for the LFTs to be followed after discharge to ensure that they normalize.  I told her we could arrange LFTs ordered under your name mid-to-late week of January 5.  Thanks  HD

## 2024-12-16 ENCOUNTER — Ambulatory Visit: Payer: Self-pay | Admitting: Family Medicine

## 2024-12-16 ENCOUNTER — Other Ambulatory Visit (INDEPENDENT_AMBULATORY_CARE_PROVIDER_SITE_OTHER): Payer: Self-pay

## 2024-12-16 ENCOUNTER — Encounter: Payer: Self-pay | Admitting: *Deleted

## 2024-12-16 DIAGNOSIS — R7989 Other specified abnormal findings of blood chemistry: Secondary | ICD-10-CM

## 2024-12-16 DIAGNOSIS — Z006 Encounter for examination for normal comparison and control in clinical research program: Secondary | ICD-10-CM

## 2024-12-16 LAB — HEPATIC FUNCTION PANEL
ALT: 130 U/L — ABNORMAL HIGH (ref 3–35)
AST: 24 U/L (ref 5–37)
Albumin: 4.4 g/dL (ref 3.5–5.2)
Alkaline Phosphatase: 137 U/L — ABNORMAL HIGH (ref 39–117)
Bilirubin, Direct: 0.2 mg/dL (ref 0.1–0.3)
Total Bilirubin: 0.8 mg/dL (ref 0.2–1.2)
Total Protein: 6.3 g/dL (ref 6.0–8.3)

## 2024-12-17 ENCOUNTER — Other Ambulatory Visit: Payer: Self-pay | Admitting: Medical Genetics

## 2024-12-17 DIAGNOSIS — Z006 Encounter for examination for normal comparison and control in clinical research program: Secondary | ICD-10-CM

## 2024-12-18 ENCOUNTER — Other Ambulatory Visit: Payer: Self-pay | Admitting: Internal Medicine

## 2024-12-18 ENCOUNTER — Ambulatory Visit: Payer: Self-pay | Admitting: Gastroenterology

## 2024-12-23 ENCOUNTER — Telehealth: Payer: Self-pay | Admitting: Pharmacist

## 2024-12-23 NOTE — Telephone Encounter (Signed)
 Pharmacy Patient Advocate Encounter   Received notification from CoverMyMeds that prior authorization for Nurtec 75MG  dispersible tablets is due for renewal.   Insurance verification completed.   The patient is insured through [facility].  Action: PA required; PA submitted to above mentioned insurance via Latent Key/confirmation #/EOC [id number] Status is pending

## 2024-12-23 NOTE — Telephone Encounter (Signed)
 Pharmacy Patient Advocate Encounter  Received notification from [facility] that Prior Authorization for Rimegepant Sulfate (NURTEC) 75 MG TBDP has been APPROVED from 12/23/2024 to 12/23/2205   PA #/Case ID/Reference #: [id number]

## 2024-12-25 ENCOUNTER — Ambulatory Visit: Payer: Self-pay | Admitting: Gastroenterology

## 2024-12-25 ENCOUNTER — Other Ambulatory Visit: Payer: Self-pay

## 2024-12-25 DIAGNOSIS — R7989 Other specified abnormal findings of blood chemistry: Secondary | ICD-10-CM

## 2024-12-25 LAB — HEPATIC FUNCTION PANEL
ALT: 60 U/L — ABNORMAL HIGH (ref 3–35)
AST: 32 U/L (ref 5–37)
Albumin: 4.7 g/dL (ref 3.5–5.2)
Alkaline Phosphatase: 94 U/L (ref 39–117)
Bilirubin, Direct: 0.2 mg/dL (ref 0.1–0.3)
Total Bilirubin: 1 mg/dL (ref 0.2–1.2)
Total Protein: 7.1 g/dL (ref 6.0–8.3)

## 2024-12-27 LAB — GENECONNECT MOLECULAR SCREEN: Genetic Analysis Overall Interpretation: NEGATIVE

## 2024-12-28 ENCOUNTER — Other Ambulatory Visit

## 2024-12-28 ENCOUNTER — Other Ambulatory Visit: Payer: Self-pay

## 2024-12-28 DIAGNOSIS — R1013 Epigastric pain: Secondary | ICD-10-CM

## 2024-12-28 DIAGNOSIS — Z006 Encounter for examination for normal comparison and control in clinical research program: Secondary | ICD-10-CM

## 2024-12-28 DIAGNOSIS — K838 Other specified diseases of biliary tract: Secondary | ICD-10-CM

## 2024-12-28 DIAGNOSIS — R7989 Other specified abnormal findings of blood chemistry: Secondary | ICD-10-CM

## 2024-12-29 ENCOUNTER — Other Ambulatory Visit: Payer: Self-pay | Admitting: Family Medicine

## 2025-01-03 ENCOUNTER — Other Ambulatory Visit: Payer: Self-pay | Admitting: Family Medicine

## 2025-01-14 ENCOUNTER — Ambulatory Visit: Payer: Self-pay | Admitting: Gastroenterology

## 2025-01-14 ENCOUNTER — Other Ambulatory Visit: Payer: Self-pay

## 2025-01-14 DIAGNOSIS — R7989 Other specified abnormal findings of blood chemistry: Secondary | ICD-10-CM

## 2025-01-14 DIAGNOSIS — R1013 Epigastric pain: Secondary | ICD-10-CM

## 2025-01-14 DIAGNOSIS — K838 Other specified diseases of biliary tract: Secondary | ICD-10-CM

## 2025-01-14 LAB — HEPATIC FUNCTION PANEL
ALT: 37 U/L — ABNORMAL HIGH (ref 3–35)
AST: 24 U/L (ref 5–37)
Albumin: 4.2 g/dL (ref 3.5–5.2)
Alkaline Phosphatase: 78 U/L (ref 39–117)
Bilirubin, Direct: 0.2 mg/dL (ref 0.1–0.3)
Total Bilirubin: 0.9 mg/dL (ref 0.2–1.2)
Total Protein: 6.4 g/dL (ref 6.0–8.3)

## 2025-02-09 ENCOUNTER — Ambulatory Visit: Admitting: Family Medicine

## 2025-02-10 ENCOUNTER — Ambulatory Visit: Payer: Self-pay | Admitting: Family Medicine

## 2025-04-13 ENCOUNTER — Ambulatory Visit: Payer: Self-pay | Admitting: Family Medicine

## 2025-04-13 ENCOUNTER — Ambulatory Visit: Admitting: Family Medicine

## 2025-04-14 ENCOUNTER — Ambulatory Visit: Admitting: Family Medicine

## 2025-06-15 ENCOUNTER — Ambulatory Visit: Admitting: Family Medicine

## 2025-08-13 ENCOUNTER — Ambulatory Visit: Admitting: Family Medicine

## 2025-10-13 ENCOUNTER — Ambulatory Visit: Admitting: Family Medicine
